# Patient Record
Sex: Female | Born: 1945 | Race: White | Hispanic: No | Marital: Married | Smoking: Former smoker
Health system: Southern US, Community
[De-identification: ages and names within clinical notes are randomized; demographics above are authoritative.]

## PROBLEM LIST (undated history)

## (undated) DIAGNOSIS — E079 Disorder of thyroid, unspecified: Secondary | ICD-10-CM

## (undated) DIAGNOSIS — C50919 Malignant neoplasm of unspecified site of unspecified female breast: Secondary | ICD-10-CM

## (undated) DIAGNOSIS — G43909 Migraine, unspecified, not intractable, without status migrainosus: Secondary | ICD-10-CM

## (undated) DIAGNOSIS — D229 Melanocytic nevi, unspecified: Secondary | ICD-10-CM

## (undated) HISTORY — DX: Disorder of thyroid, unspecified: E07.9

## (undated) HISTORY — PX: ABDOMINAL HYSTERECTOMY: SHX81

## (undated) HISTORY — DX: Malignant neoplasm of unspecified site of unspecified female breast: C50.919

## (undated) HISTORY — DX: Melanocytic nevi, unspecified: D22.9

## (undated) HISTORY — DX: Migraine, unspecified, not intractable, without status migrainosus: G43.909

---

## 2006-04-26 ENCOUNTER — Emergency Department (HOSPITAL_COMMUNITY): Admission: EM | Admit: 2006-04-26 | Discharge: 2006-04-26 | Payer: Self-pay | Admitting: Emergency Medicine

## 2007-05-27 ENCOUNTER — Ambulatory Visit (HOSPITAL_COMMUNITY): Admission: RE | Admit: 2007-05-27 | Discharge: 2007-05-27 | Payer: Self-pay

## 2007-06-24 ENCOUNTER — Encounter: Admission: RE | Admit: 2007-06-24 | Discharge: 2007-06-24 | Payer: Self-pay | Admitting: Internal Medicine

## 2008-06-14 ENCOUNTER — Ambulatory Visit (HOSPITAL_COMMUNITY): Admission: RE | Admit: 2008-06-14 | Discharge: 2008-06-14 | Payer: Self-pay | Admitting: Internal Medicine

## 2009-06-15 ENCOUNTER — Encounter: Admission: RE | Admit: 2009-06-15 | Discharge: 2009-06-15 | Payer: Self-pay | Admitting: Family Medicine

## 2010-06-27 ENCOUNTER — Ambulatory Visit (HOSPITAL_COMMUNITY)
Admission: RE | Admit: 2010-06-27 | Discharge: 2010-06-27 | Payer: Self-pay | Source: Home / Self Care | Attending: Family Medicine | Admitting: Family Medicine

## 2011-02-05 DIAGNOSIS — D229 Melanocytic nevi, unspecified: Secondary | ICD-10-CM

## 2011-02-05 HISTORY — DX: Melanocytic nevi, unspecified: D22.9

## 2011-07-04 ENCOUNTER — Other Ambulatory Visit: Payer: Self-pay | Admitting: Family Medicine

## 2011-07-04 DIAGNOSIS — Z139 Encounter for screening, unspecified: Secondary | ICD-10-CM

## 2011-07-10 ENCOUNTER — Ambulatory Visit (HOSPITAL_COMMUNITY)
Admission: RE | Admit: 2011-07-10 | Discharge: 2011-07-10 | Disposition: A | Discharge status: Home / Self Care | Payer: Medicare Other | Source: Ambulatory Visit | Attending: Family Medicine | Admitting: Family Medicine

## 2011-07-10 DIAGNOSIS — Z139 Encounter for screening, unspecified: Secondary | ICD-10-CM

## 2011-07-10 DIAGNOSIS — Z1231 Encounter for screening mammogram for malignant neoplasm of breast: Secondary | ICD-10-CM | POA: Diagnosis not present

## 2011-09-07 ENCOUNTER — Ambulatory Visit (INDEPENDENT_AMBULATORY_CARE_PROVIDER_SITE_OTHER): Payer: Medicare Other | Admitting: Family Medicine

## 2011-09-07 ENCOUNTER — Encounter: Payer: Self-pay | Admitting: Family Medicine

## 2011-09-07 VITALS — BP 116/65 | HR 49 | Temp 97.8°F | Resp 16 | Ht 63.0 in | Wt 115.6 lb

## 2011-09-07 DIAGNOSIS — G4700 Insomnia, unspecified: Secondary | ICD-10-CM

## 2011-09-07 DIAGNOSIS — Z0000 Encounter for general adult medical examination without abnormal findings: Secondary | ICD-10-CM | POA: Diagnosis not present

## 2011-09-07 DIAGNOSIS — E789 Disorder of lipoprotein metabolism, unspecified: Secondary | ICD-10-CM

## 2011-09-07 DIAGNOSIS — E785 Hyperlipidemia, unspecified: Secondary | ICD-10-CM

## 2011-09-07 DIAGNOSIS — R5383 Other fatigue: Secondary | ICD-10-CM

## 2011-09-07 DIAGNOSIS — Z86018 Personal history of other benign neoplasm: Secondary | ICD-10-CM | POA: Insufficient documentation

## 2011-09-07 LAB — COMPREHENSIVE METABOLIC PANEL
ALT: 21 U/L (ref 0–35)
AST: 20 U/L (ref 0–37)
Albumin: 4.1 g/dL (ref 3.5–5.2)
Alkaline Phosphatase: 55 U/L (ref 39–117)
BUN: 11 mg/dL (ref 6–23)
CO2: 25 mEq/L (ref 19–32)
Calcium: 9.4 mg/dL (ref 8.4–10.5)
Chloride: 107 mEq/L (ref 96–112)
Creat: 0.86 mg/dL (ref 0.50–1.10)
Glucose, Bld: 92 mg/dL (ref 70–99)
Potassium: 5 mEq/L (ref 3.5–5.3)
Sodium: 140 mEq/L (ref 135–145)
Total Bilirubin: 0.4 mg/dL (ref 0.3–1.2)
Total Protein: 7.1 g/dL (ref 6.0–8.3)

## 2011-09-07 LAB — POCT CBC
Granulocyte percent: 59.6 %G (ref 37–80)
HCT, POC: 39.8 % (ref 37.7–47.9)
Hemoglobin: 12.8 g/dL (ref 12.2–16.2)
Lymph, poc: 2.2 (ref 0.6–3.4)
MCH, POC: 30.6 pg (ref 27–31.2)
MCHC: 32.2 g/dL (ref 31.8–35.4)
MCV: 95.1 fL (ref 80–97)
MID (cbc): 0.4 (ref 0–0.9)
MPV: 11.3 fL (ref 0–99.8)
POC Granulocyte: 3.8 (ref 2–6.9)
POC LYMPH PERCENT: 34.4 %L (ref 10–50)
POC MID %: 6 %M (ref 0–12)
Platelet Count, POC: 210 10*3/uL (ref 142–424)
RBC: 4.18 M/uL (ref 4.04–5.48)
RDW, POC: 13.4 %
WBC: 6.4 10*3/uL (ref 4.6–10.2)

## 2011-09-07 LAB — POCT UA - MICROSCOPIC ONLY
Casts, Ur, LPF, POC: NEGATIVE
Crystals, Ur, HPF, POC: NEGATIVE
Yeast, UA: NEGATIVE

## 2011-09-07 LAB — LIPID PANEL
Cholesterol: 187 mg/dL (ref 0–200)
HDL: 58 mg/dL (ref >39)
LDL Cholesterol: 111 mg/dL — ABNORMAL HIGH (ref 0–99)
Total CHOL/HDL Ratio: 3.2 Ratio
Triglycerides: 88 mg/dL (ref <150)
VLDL: 18 mg/dL (ref 0–40)

## 2011-09-07 LAB — POCT URINALYSIS DIPSTICK
Bilirubin, UA: NEGATIVE
Glucose, UA: NEGATIVE
Ketones, UA: NEGATIVE
Leukocytes, UA: NEGATIVE
Nitrite, UA: NEGATIVE
Protein, UA: NEGATIVE
Spec Grav, UA: 1.025
Urobilinogen, UA: 0.2
pH, UA: 5

## 2011-09-07 LAB — TSH: TSH: 6.159 u[IU]/mL — ABNORMAL HIGH (ref 0.350–4.500)

## 2011-09-07 MED ORDER — ZOLPIDEM TARTRATE 10 MG PO TABS: ORAL_TABLET | ORAL | Status: DC

## 2011-09-07 NOTE — Patient Instructions (Signed)

## 2011-09-07 NOTE — Progress Notes (Signed)
Patient here for refill of Ambien which she breaks in quarters for prn use.  Daughter is alcoholic.  Her son in law has a glioblastoma.  She has two grandchildren, one of whom the patient adopted. Has eyes checked regularly Had mammogram 1/13 Had colonoscopy Last dT 2006  No further palpitations  O: HEENT unremarkable   Chest:  Clear Heart:  Reg, no murmur Abdomen:  Soft, nontender without HSM Skin: clear Ext: no edema  A:  Healthy without new problems  P:  Check labs Reviewed pneumovax.

## 2011-09-08 ENCOUNTER — Other Ambulatory Visit: Payer: Self-pay | Admitting: Family Medicine

## 2011-09-08 DIAGNOSIS — E039 Hypothyroidism, unspecified: Secondary | ICD-10-CM

## 2011-09-08 MED ORDER — LEVOTHYROXINE SODIUM 25 MCG PO TABS: 25.0000 ug | ORAL_TABLET | Freq: Every day | ORAL | Status: DC

## 2011-12-11 ENCOUNTER — Other Ambulatory Visit: Payer: Self-pay | Admitting: Family Medicine

## 2011-12-13 ENCOUNTER — Other Ambulatory Visit: Payer: Self-pay

## 2011-12-13 ENCOUNTER — Telehealth: Payer: Self-pay

## 2011-12-13 DIAGNOSIS — E039 Hypothyroidism, unspecified: Secondary | ICD-10-CM

## 2011-12-13 NOTE — Telephone Encounter (Signed)
PATIENT NEEDS REFILL OF LEVOTHYROXINE.  PHARMACY TOLD HER THEY SENT REQUEST ALREADY.  I TOLD HER TO CALL THEM AGAIN AND MAKE SURE IT WAS ESCRIBED.  SHE WILL BE HERE TO SEE DR. L ON SUNDAY

## 2011-12-14 MED ORDER — LEVOTHYROXINE SODIUM 25 MCG PO TABS: 25.0000 ug | ORAL_TABLET | Freq: Every day | ORAL | Status: DC

## 2011-12-14 NOTE — Telephone Encounter (Signed)
Done

## 2011-12-15 NOTE — Telephone Encounter (Signed)
[facility] notifying patient rx sent in.

## 2011-12-16 ENCOUNTER — Ambulatory Visit (INDEPENDENT_AMBULATORY_CARE_PROVIDER_SITE_OTHER): Payer: Medicare Other | Admitting: Family Medicine

## 2011-12-16 VITALS — BP 124/52 | HR 56 | Temp 97.5°F | Resp 16 | Ht 62.78 in | Wt 150.8 lb

## 2011-12-16 DIAGNOSIS — E039 Hypothyroidism, unspecified: Secondary | ICD-10-CM | POA: Diagnosis not present

## 2011-12-16 LAB — TSH: TSH: 4.371 u[IU]/mL (ref 0.350–4.500)

## 2011-12-16 NOTE — Progress Notes (Signed)
66 yo woman here for thyroid check.  No problems on levothyroxine.  Last dose was 3 days ago.  O:  NAD Results for orders placed in visit on 09/07/11  LIPID PANEL      Component Value Range   Cholesterol 187  0 - 200 mg/dL   Triglycerides 88  <161 mg/dL   HDL 58  >09 mg/dL   Total CHOL/HDL Ratio 3.2     VLDL 18  0 - 40 mg/dL   LDL Cholesterol 604 (*) 0 - 99 mg/dL  COMPREHENSIVE METABOLIC PANEL      Component Value Range   Sodium 140  135 - 145 mEq/L   Potassium 5.0  3.5 - 5.3 mEq/L   Chloride 107  96 - 112 mEq/L   CO2 25  19 - 32 mEq/L   Glucose, Bld 92  70 - 99 mg/dL   BUN 11  6 - 23 mg/dL   Creat 5.40  9.81 - 1.91 mg/dL   Total Bilirubin 0.4  0.3 - 1.2 mg/dL   Alkaline Phosphatase 55  39 - 117 U/L   AST 20  0 - 37 U/L   ALT 21  0 - 35 U/L   Total Protein 7.1  6.0 - 8.3 g/dL   Albumin 4.1  3.5 - 5.2 g/dL   Calcium 9.4  8.4 - 47.8 mg/dL  POCT CBC      Component Value Range   WBC 6.4  4.6 - 10.2 K/uL   Lymph, poc 2.2  0.6 - 3.4   POC LYMPH PERCENT 34.4  10 - 50 %L   MID (cbc) 0.4  0 - 0.9   POC MID % 6.0  0 - 12 %M   POC Granulocyte 3.8  2 - 6.9   Granulocyte percent 59.6  37 - 80 %G   RBC 4.18  4.04 - 5.48 M/uL   Hemoglobin 12.8  12.2 - 16.2 g/dL   HCT, POC 29.5  62.1 - 47.9 %   MCV 95.1  80 - 97 fL   MCH, POC 30.6  27 - 31.2 pg   MCHC 32.2  31.8 - 35.4 g/dL   RDW, POC 30.8     Platelet Count, POC 210  142 - 424 K/uL   MPV 11.3  0 - 99.8 fL  POCT UA - MICROSCOPIC ONLY      Component Value Range   WBC, Ur, HPF, POC 0-2     RBC, urine, microscopic 2-4     Bacteria, U Microscopic 2+     Mucus, UA 1+     Epithelial cells, urine per micros 5-10     Crystals, Ur, HPF, POC neg     Casts, Ur, LPF, POC neg     Yeast, UA neg    TSH      Component Value Range   TSH 6.159 (*) 0.350 - 4.500 uIU/mL  POCT URINALYSIS DIPSTICK      Component Value Range   Color, UA yellow     Clarity, UA hazy     Glucose, UA neg     Bilirubin, UA neg     Ketones, UA neg     Spec Grav,  UA 1.025     Blood, UA mod     pH, UA 5.0     Protein, UA neg     Urobilinogen, UA 0.2     Nitrite, UA neg     Leukocytes, UA Negative       A:  Hypothyroidism.  Seems stable on the 25 mcg.  P:  TSH ordered.

## 2011-12-17 ENCOUNTER — Other Ambulatory Visit: Payer: Self-pay | Admitting: Family Medicine

## 2011-12-17 DIAGNOSIS — E071 Dyshormogenetic goiter: Secondary | ICD-10-CM

## 2011-12-17 MED ORDER — LEVOTHYROXINE SODIUM 50 MCG PO TABS: 50.0000 ug | ORAL_TABLET | Freq: Every day | ORAL | Status: DC

## 2012-03-18 ENCOUNTER — Other Ambulatory Visit: Payer: Self-pay | Admitting: Family Medicine

## 2012-04-01 DIAGNOSIS — D219 Benign neoplasm of connective and other soft tissue, unspecified: Secondary | ICD-10-CM | POA: Diagnosis not present

## 2012-04-01 DIAGNOSIS — L570 Actinic keratosis: Secondary | ICD-10-CM | POA: Diagnosis not present

## 2012-04-01 DIAGNOSIS — D239 Other benign neoplasm of skin, unspecified: Secondary | ICD-10-CM | POA: Diagnosis not present

## 2012-04-15 ENCOUNTER — Other Ambulatory Visit: Payer: Self-pay | Admitting: Family Medicine

## 2012-06-30 ENCOUNTER — Other Ambulatory Visit: Payer: Self-pay | Admitting: Family Medicine

## 2012-06-30 DIAGNOSIS — Z139 Encounter for screening, unspecified: Secondary | ICD-10-CM

## 2012-07-14 ENCOUNTER — Ambulatory Visit (HOSPITAL_COMMUNITY)
Admission: RE | Admit: 2012-07-14 | Discharge: 2012-07-14 | Disposition: A | Discharge status: Home / Self Care | Payer: Medicare Other | Source: Ambulatory Visit | Attending: Family Medicine | Admitting: Family Medicine

## 2012-07-14 DIAGNOSIS — Z139 Encounter for screening, unspecified: Secondary | ICD-10-CM

## 2012-07-14 DIAGNOSIS — Z1231 Encounter for screening mammogram for malignant neoplasm of breast: Secondary | ICD-10-CM | POA: Diagnosis not present

## 2012-09-03 ENCOUNTER — Ambulatory Visit (INDEPENDENT_AMBULATORY_CARE_PROVIDER_SITE_OTHER): Payer: Medicare Other | Admitting: Family Medicine

## 2012-09-03 VITALS — BP 110/60 | HR 51 | Temp 98.8°F | Resp 16 | Ht 62.5 in | Wt 154.0 lb

## 2012-09-03 DIAGNOSIS — E039 Hypothyroidism, unspecified: Secondary | ICD-10-CM | POA: Diagnosis not present

## 2012-09-03 DIAGNOSIS — R42 Dizziness and giddiness: Secondary | ICD-10-CM | POA: Diagnosis not present

## 2012-09-03 DIAGNOSIS — R319 Hematuria, unspecified: Secondary | ICD-10-CM | POA: Diagnosis not present

## 2012-09-03 DIAGNOSIS — E785 Hyperlipidemia, unspecified: Secondary | ICD-10-CM

## 2012-09-03 LAB — POCT URINALYSIS DIPSTICK
Bilirubin, UA: NEGATIVE
Glucose, UA: NEGATIVE
Ketones, UA: 1.5
Leukocytes, UA: NEGATIVE
Nitrite, UA: NEGATIVE
Protein, UA: NEGATIVE
Spec Grav, UA: 1.025
Urobilinogen, UA: 1
pH, UA: 6

## 2012-09-03 LAB — COMPREHENSIVE METABOLIC PANEL
ALT: 21 U/L (ref 0–35)
Albumin: 4.4 g/dL (ref 3.5–5.2)
Alkaline Phosphatase: 66 U/L (ref 39–117)
CO2: 29 mEq/L (ref 19–32)
Calcium: 9.7 mg/dL (ref 8.4–10.5)
Creat: 0.74 mg/dL (ref 0.50–1.10)
Glucose, Bld: 86 mg/dL (ref 70–99)
Total Bilirubin: 0.6 mg/dL (ref 0.3–1.2)

## 2012-09-03 LAB — POCT CBC
Granulocyte percent: 55.8 % (ref 37–80)
HCT, POC: 46 % (ref 37.7–47.9)
Hemoglobin: 14.4 g/dL (ref 12.2–16.2)
Lymph, poc: 2.7 (ref 0.6–3.4)
MCH, POC: 29.9 pg (ref 27–31.2)
MCHC: 31.3 g/dL — AB (ref 31.8–35.4)
MCV: 95.5 fL (ref 80–97)
MID (cbc): 0.6 (ref 0–0.9)
MPV: 11.5 fL (ref 0–99.8)
POC Granulocyte: 4.2 (ref 2–6.9)
POC LYMPH PERCENT: 36.3 % (ref 10–50)
POC MID %: 7.9 %M (ref 0–12)
Platelet Count, POC: 239 10*3/uL (ref 142–424)
RBC: 4.82 M/uL (ref 4.04–5.48)
RDW, POC: 13.6 %
WBC: 7.5 10*3/uL (ref 4.6–10.2)

## 2012-09-03 LAB — POCT UA - MICROSCOPIC ONLY
Casts, Ur, LPF, POC: NEGATIVE
Crystals, Ur, HPF, POC: NEGATIVE
Mucus, UA: POSITIVE
Yeast, UA: NEGATIVE

## 2012-09-03 LAB — LIPID PANEL
Cholesterol: 196 mg/dL (ref 0–200)
HDL: 68 mg/dL (ref >39)
LDL Cholesterol: 113 mg/dL — ABNORMAL HIGH (ref 0–99)
Total CHOL/HDL Ratio: 2.9 Ratio
Triglycerides: 76 mg/dL (ref <150)
VLDL: 15 mg/dL (ref 0–40)

## 2012-09-03 LAB — COMPREHENSIVE METABOLIC PANEL WITH GFR
AST: 20 U/L (ref 0–37)
BUN: 12 mg/dL (ref 6–23)
Chloride: 105 meq/L (ref 96–112)
Potassium: 4.7 meq/L (ref 3.5–5.3)
Sodium: 140 meq/L (ref 135–145)
Total Protein: 7.3 g/dL (ref 6.0–8.3)

## 2012-09-03 LAB — TSH: TSH: 2.628 u[IU]/mL (ref 0.350–4.500)

## 2012-09-03 NOTE — Progress Notes (Signed)
[facility]:  Office Visit  Chief Complaint:  Chief Complaint  Patient presents with  . Dizziness    x 1 day    HPI: Julie Pacheco is a 67 y.o. female who complains of dizziness starting  last night, laid down in bed, room was spinning. Earlier in the day when she was looking for some pictures, she looked up her head, neck extension the  room was spinning. Dizziness is better this morning, currently she does not have any sxs. She has been able to move head without dizziness. She has been active, has gotten her hair done without problems. She denies CP or palpitations. She has had a chronic history of bradycardia. Has had stress test and also echo done with cardiology 2 years ago  without any problems.  She is on levothyroxine and has not had her TSH checked in about 9 months after increases. Denies CP or SOB.  Denies vertigo, has had recent sinus congestion for last 2 weeks. Denies nausea, vomiting, she does have night sweats from hotflashes. 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  BP was 146/67 at [facility]. She normally has low BP.  Wants to get her lipids rechecked, states she has not eaten anything since this AM   Past Medical History  Diagnosis Date  . Migraines    History reviewed. No pertinent past surgical history. History   Social History  . Marital Status: Married    Spouse Name: N/A    Number of Children: N/A  . Years of Education: N/A   Social History Main Topics  . Smoking status: Former Smoker    Quit date: 12/15/1981  . Smokeless tobacco: None  . Alcohol Use: None  . Drug Use: None  . Sexually Active: None   Other Topics Concern  . None   Social History Narrative  . None   History reviewed. No pertinent family history. No Known  Allergies Prior to Admission medications   Medication Sig Start Date End Date Taking? Authorizing Provider  levothyroxine (SYNTHROID, LEVOTHROID) 50 MCG tablet Take 1 tablet (50 mcg total) by mouth daily. 12/17/11 12/16/12 Yes Elvina Sidle, MD  zolpidem (AMBIEN) 10 MG tablet Take 10 mg by mouth. Takes 1/2 at bedtime prn.   Yes Historical Provider, MD  zolpidem (AMBIEN) 10 MG tablet Break into half or quarter for sleep 09/07/11   Elvina Sidle, MD  zolpidem (AMBIEN) 10 MG tablet TAKE ONE-HALF OR ONE-FOURTH TABLET BY MOUTH DAILY AT BEDTIME FOR SLEEP 03/18/12   Chelle S Jeffery, PA-C  zolpidem (AMBIEN) 10 MG tablet TAKE ONE-HALF OR ONE-FOURTH TABLET BY MOUTH DAILY AT BEDTIME FOR SLEEP 04/15/12   Elvina Sidle, MD     ROS: The patient denies fevers, chills, night sweats, unintentional weight loss, chest pain, palpitations, wheezing, dyspnea on exertion, nausea, vomiting, abdominal pain, dysuria, hematuria, melena, numbness, weakness, or tingling.   All other systems have been reviewed and were otherwise negative with the exception of those mentioned in the HPI and as above.    PHYSICAL EXAM: Filed Vitals:   09/03/12 1659  BP: 110/60  Pulse:   Temp:   Resp:    Filed Vitals:   09/03/12 1529  Height: 5' 2.5" (1.588 m)  Weight: 154 lb (69.854 kg)  Body mass index is 27.7 kg/(m^2).  General: Alert, no acute distress HEENT:  Normocephalic, atraumatic, oropharynx patent. EOMI, PERRLA, fundoscopic exam nl Cardiovascular:  Bradycardia,  Regular rhythm, no rubs murmurs or gallops.  No Carotid bruits, radial pulse intact. No pedal edema.  Respiratory: Clear to auscultation bilaterally.  No wheezes, rales, or rhonchi.  No cyanosis, no use of accessory musculature GI: No organomegaly, abdomen is soft and non-tender, positive bowel sounds.  No masses. Skin: No rashes. Neurologic: Facial musculature symmetric. Psychiatric: Patient is appropriate throughout our interaction. Lymphatic: No  cervical lymphadenopathy Musculoskeletal: Gait intact.   LABS: Results for orders placed in visit on 09/03/12  POCT CBC      Result Value Range   WBC 7.5  4.6 - 10.2 K/uL   Lymph, poc 2.7  0.6 - 3.4   POC LYMPH PERCENT 36.3  10 - 50 %L   MID (cbc) 0.6  0 - 0.9   POC MID % 7.9  0 - 12 %M   POC Granulocyte 4.2  2 - 6.9   Granulocyte percent 55.8  37 - 80 %G   RBC 4.82  4.04 - 5.48 M/uL   Hemoglobin 14.4  12.2 - 16.2 g/dL   HCT, POC 16.1  09.6 - 47.9 %   MCV 95.5  80 - 97 fL   MCH, POC 29.9  27 - 31.2 pg   MCHC 31.3 (*) 31.8 - 35.4 g/dL   RDW, POC 04.5     Platelet Count, POC 239  142 - 424 K/uL   MPV 11.5  0 - 99.8 fL  POCT URINALYSIS DIPSTICK      Result Value Range   Color, UA yellow     Clarity, UA clear     Glucose, UA neg     Bilirubin, UA neg     Ketones, UA 1.5     Spec Grav, UA 1.025     Blood, UA small     pH, UA 6.0     Protein, UA neg     Urobilinogen, UA 1.0     Nitrite, UA neg     Leukocytes, UA Negative    POCT UA - MICROSCOPIC ONLY      Result Value Range   WBC, Ur, HPF, POC 6-7     RBC, urine, microscopic 19-21     Bacteria, U Microscopic 4+     Mucus, UA pos     Epithelial cells, urine per micros 5-7     Crystals, Ur, HPF, POC neg     Casts, Ur, LPF, POC neg     Yeast, UA neg       EKG/XRAY:   Primary read interpreted by Dr. Conley Rolls at [facility]. EKG sinus brady 45-49 bpm, nonspecific ST changes, nl intervals   ASSESSMENT/PLAN: Encounter Diagnoses  Name Primary?  . Dizziness and giddiness Yes  . Other and unspecified hyperlipidemia   . Unspecified hypothyroidism   . Hematuria, unspecified    [phone] Declines nasal spray Dizziness etiology--- related to sinus congestion/inner ear  vs possible medication SE vs less likely orthostatics or cardiac(orthostatics nl) vs unlikely UTI. She has chronic bradycardia, no other changes on EKG, no chest pain Dizziness is resolved today, continue to monitor Will await for pending labs TSH, CMP Advise  patient to return after her trip from [place] for wedding to recheck urine for hematuria. Former smoker x 30 years, 1/2 ppd quit 1989      [place], DO 09/03/2012 6:10 PM

## 2012-09-04 ENCOUNTER — Encounter: Payer: Self-pay | Admitting: Family Medicine

## 2012-09-04 ENCOUNTER — Telehealth: Payer: Self-pay | Admitting: Family Medicine

## 2012-09-04 NOTE — Telephone Encounter (Signed)
Spoke to patient about labs. Advise her to return to [place] for repeat urine after [place] trip.

## 2012-10-08 ENCOUNTER — Ambulatory Visit (INDEPENDENT_AMBULATORY_CARE_PROVIDER_SITE_OTHER): Payer: Medicare Other | Admitting: Family Medicine

## 2012-10-08 VITALS — BP 110/72 | HR 62 | Temp 97.8°F | Resp 16 | Ht 63.0 in | Wt 152.0 lb

## 2012-10-08 DIAGNOSIS — R319 Hematuria, unspecified: Secondary | ICD-10-CM

## 2012-10-08 DIAGNOSIS — G4700 Insomnia, unspecified: Secondary | ICD-10-CM | POA: Diagnosis not present

## 2012-10-08 DIAGNOSIS — N390 Urinary tract infection, site not specified: Secondary | ICD-10-CM

## 2012-10-08 LAB — POCT URINALYSIS DIPSTICK
Bilirubin, UA: NEGATIVE
Glucose, UA: NEGATIVE
Ketones, UA: NEGATIVE
Leukocytes, UA: NEGATIVE
Nitrite, UA: NEGATIVE
Protein, UA: NEGATIVE
Spec Grav, UA: >=1.03
Urobilinogen, UA: 0.2
pH, UA: 5.5

## 2012-10-08 LAB — POCT UA - MICROSCOPIC ONLY
Casts, Ur, LPF, POC: NEGATIVE
Crystals, Ur, HPF, POC: NEGATIVE
Mucus, UA: POSITIVE
Yeast, UA: NEGATIVE

## 2012-10-08 MED ORDER — ZOLPIDEM TARTRATE 10 MG PO TABS: ORAL_TABLET | ORAL | Status: DC

## 2012-10-08 NOTE — Progress Notes (Signed)
Patient ID: Julie Pacheco MRN: [id number], DOB: Oct 20, 1945, 67 y.o. Date of Encounter: 10/08/2012, 9:01 AM  Primary Physician: Elvina Sidle, MD  Chief Complaint:  Chief Complaint  Patient presents with  . Urinary Tract Infection    recheck    HPI: 67 y.o. year old female presents with 30 day history of hematuria  No sick contacts, recent antibiotics, or recent travels.   No vaginal discharge, back pain, fever  Past Medical History  Diagnosis Date  . Migraines      Home Meds: Prior to Admission medications   Medication Sig Start Date End Date Taking? Authorizing Provider  levothyroxine (SYNTHROID, LEVOTHROID) 50 MCG tablet Take 1 tablet (50 mcg total) by mouth daily. 12/17/11 12/16/12 Yes Elvina Sidle, MD  zolpidem (AMBIEN) 10 MG tablet Break into half or quarter for sleep 09/07/11  Yes Elvina Sidle, MD  zolpidem (AMBIEN) 10 MG tablet Take 10 mg by mouth. Takes 1/2 at bedtime prn.    Historical Provider, MD  zolpidem (AMBIEN) 10 MG tablet TAKE ONE-HALF OR ONE-FOURTH TABLET BY MOUTH DAILY AT BEDTIME FOR SLEEP 03/18/12   Chelle S Jeffery, PA-C  zolpidem (AMBIEN) 10 MG tablet TAKE ONE-HALF OR ONE-FOURTH TABLET BY MOUTH DAILY AT BEDTIME FOR SLEEP 04/15/12   Elvina Sidle, MD    Allergies: No Known Allergies  History   Social History  . Marital Status: Married    Spouse Name: N/A    Number of Children: N/A  . Years of Education: N/A   Occupational History  . Not on file.   Social History Main Topics  . Smoking status: Former Smoker    Quit date: 12/15/1981  . Smokeless tobacco: Not on file  . Alcohol Use: Not on file  . Drug Use: Not on file  . Sexually Active: Not on file   Other Topics Concern  . Not on file   Social History Narrative  . No narrative on file     Review of Systems: Constitutional: negative for chills, fever, night sweats or weight changes Cardiovascular: negative for chest pain or palpitations Respiratory: negative for  hemoptysis, wheezing, or shortness of breath Abdominal: negative for abdominal pain, nausea, vomiting or diarrhea Dermatological: negative for rash Neurologic: negative for headache   Physical Exam: Blood pressure 110/72, pulse 62, temperature 97.8 F (36.6 C), temperature source Oral, resp. rate 16, height 5\' 3"  (1.6 m), weight 152 lb (68.947 kg), SpO2 94.00%., Body mass index is 26.93 kg/(m^2). General: Well developed, well nourished, in no acute distress. Head: Normocephalic, atraumatic, eyes without discharge, sclera non-icteric, nares are congested. Bilateral auditory canals clear, TM's are without perforation, pearly grey with reflective cone of light bilaterally. Serous effusion bilaterally behind TM's. Maxillary sinus TTP. Oral cavity moist, dentition normal. Posterior pharynx with post nasal drip and mild erythema. No peritonsillar abscess or tonsillar exudate. Neck: Supple. No thyromegaly. Full ROM. No lymphadenopathy. Lungs: Coarse breath sounds bilaterally without Clear bilaterally to auscultation without wheezes, rales, or rhonchi. Breathing is unlabored.  Heart: RRR with S1 S2. No murmurs, rubs, or gallops appreciated. Abdomen: Soft, non-tender, non-distended with normoactive bowel sounds. No hepatosplenomegaly. No rebound/guarding. No obvious abdominal masses. McBurney's, Rovsing's, Iliopsoas, and table jar all negative. Msk:  Strength and tone normal for age. Extremities: No clubbing or cyanosis. No edema. Neuro: Alert and oriented X 3. Moves all extremities spontaneously. CNII-XII grossly in tact. Psych:  Responds to questions appropriately with a normal affect.   Labs: Results for orders placed in visit on 10/08/12  POCT  UA - MICROSCOPIC ONLY      Result Value Range   WBC, Ur, HPF, POC 2-3     RBC, urine, microscopic 4-5     Bacteria, U Microscopic 2+     Mucus, UA positive     Epithelial cells, urine per micros 6-8     Crystals, Ur, HPF, POC neg     Casts, Ur, LPF,  POC neg     Yeast, UA neg    POCT URINALYSIS DIPSTICK      Result Value Range   Color, UA yellow     Clarity, UA clear     Glucose, UA neg     Bilirubin, UA neg     Ketones, UA neg     Spec Grav, UA >=1.030     Blood, UA small     pH, UA 5.5     Protein, UA neg     Urobilinogen, UA 0.2     Nitrite, UA neg     Leukocytes, UA Negative        ASSESSMENT AND PLAN:  67 y.o. year old female with recent hematuria.  The amount of blood in urine is minimal.  Recheck urine in 60 days. - -Mucinex -Tylenol/Motrin prn -Rest/fluids -RTC precautions -RTC 3-5 days if no improvement  Signed, Elvina Sidle, MD 10/08/2012 9:01 AM

## 2012-10-08 NOTE — Patient Instructions (Signed)
Results for orders placed in visit on 10/08/12  POCT UA - MICROSCOPIC ONLY      Result Value Range   WBC, Ur, HPF, POC 2-3     RBC, urine, microscopic 4-5     Bacteria, U Microscopic 2+     Mucus, UA positive     Epithelial cells, urine per micros 6-8     Crystals, Ur, HPF, POC neg     Casts, Ur, LPF, POC neg     Yeast, UA neg    POCT URINALYSIS DIPSTICK      Result Value Range   Color, UA yellow     Clarity, UA clear     Glucose, UA neg     Bilirubin, UA neg     Ketones, UA neg     Spec Grav, UA >=1.030     Blood, UA small     pH, UA 5.5     Protein, UA neg     Urobilinogen, UA 0.2     Nitrite, UA neg     Leukocytes, UA Negative

## 2012-10-21 ENCOUNTER — Ambulatory Visit (INDEPENDENT_AMBULATORY_CARE_PROVIDER_SITE_OTHER): Payer: Medicare Other | Admitting: Family Medicine

## 2012-10-21 VITALS — BP 110/78 | HR 54 | Temp 98.0°F | Resp 16 | Ht 63.0 in | Wt 151.0 lb

## 2012-10-21 DIAGNOSIS — L255 Unspecified contact dermatitis due to plants, except food: Secondary | ICD-10-CM | POA: Diagnosis not present

## 2012-10-21 DIAGNOSIS — L237 Allergic contact dermatitis due to plants, except food: Secondary | ICD-10-CM

## 2012-10-21 MED ORDER — HYDROXYZINE HCL 25 MG PO TABS: 12.5000 mg | ORAL_TABLET | Freq: Three times a day (TID) | ORAL | Status: DC | PRN

## 2012-10-21 MED ORDER — PREDNISONE 10 MG PO TABS: ORAL_TABLET | ORAL | Status: DC

## 2012-10-21 NOTE — Progress Notes (Signed)
  Subjective:    Patient ID: Julie Pacheco, female    DOB: 1945-12-01, 67 y.o.   MRN: [id number] Chief Complaint  Patient presents with  . Rash    all over body x 5 day   HPI  Has had poison oak or ivy - was exposed 5d ago - thought it was a mosquito bite on her chin but kept spreading and puffy - spread up entire face, both sides, splotchy - now on arms and waist.  Tried bleach, tried some topical poison ivy spray, iverest - pink tube w/ calamine in it.  Very itchy - not taking any otc medications for this.  Has never had anything like this prior.  Past Medical History  Diagnosis Date  . Migraines   . Thyroid disease    Current Outpatient Prescriptions on File Prior to Visit  Medication Sig Dispense Refill  . levothyroxine (SYNTHROID, LEVOTHROID) 50 MCG tablet Take 1 tablet (50 mcg total) by mouth daily.  90 tablet  3  . zolpidem (AMBIEN) 10 MG tablet As directed at bedtime  30 tablet  1   No current facility-administered medications on file prior to visit.   No Known Allergies  Review of Systems  Constitutional: Negative for fever, chills and diaphoresis.  Musculoskeletal: Negative for joint swelling and arthralgias.  Skin: Positive for color change and rash. Negative for pallor and wound.  Hematological: Negative for adenopathy. Bruises/bleeds easily.  Psychiatric/Behavioral: Positive for sleep disturbance.      BP 110/78  Pulse 54  Temp(Src) 98 F (36.7 C) (Oral)  Resp 16  Ht 5\' 3"  (1.6 m)  Wt 151 lb (68.493 kg)  BMI 26.76 kg/m2  SpO2 98% Objective:   Physical Exam  Constitutional: She is oriented to person, place, and time. She appears well-developed and well-nourished. No distress.  HENT:  Head: Normocephalic and atraumatic.  Right Ear: External ear normal.  Eyes: Conjunctivae are normal. No scleral icterus.  Pulmonary/Chest: Effort normal.  Neurological: She is alert and oriented to person, place, and time.  Skin: Skin is warm and dry. Rash noted. Rash is  maculopapular and vesicular. She is not diaphoretic. No erythema.  Over bilateral chin, cheeks, eyebrows, forearms, left waist  Psychiatric: She has a normal mood and affect. Her behavior is normal.      Assessment & Plan:  Poison ivy dermatitis - reviewed trx options and pt really doesn't want to try topical - spread to extensive - so will do 12d steroid taper.  Topical calamine and ice/cool compresses prn.  Meds ordered this encounter  Medications  . predniSONE (DELTASONE) 10 MG tablet    Sig: Take 6 tabs qd x2d, then 5tabs qd x 2d, then 4 tabs qd x 2d, then 3 tabs qd x 2d, then 2 tabs qd x 2d, then 1 tab qd x 1d    Dispense:  42 tablet    Refill:  0  . hydrOXYzine (ATARAX/VISTARIL) 25 MG tablet    Sig: Take 0.5-1 tablets (12.5-25 mg total) by mouth every 8 (eight) hours as needed for itching.    Dispense:  30 tablet    Refill:  0

## 2012-10-21 NOTE — Patient Instructions (Addendum)
Poison Ivy Poison ivy is a inflammation of the skin (contact dermatitis) caused by touching the allergens on the leaves of the ivy plant following previous exposure to the plant. The rash usually appears 48 hours after exposure. The rash is usually bumps (papules) or blisters (vesicles) in a linear pattern. Depending on your own sensitivity, the rash may simply cause redness and itching, or it may also progress to blisters which may break open. These must be well cared for to prevent secondary bacterial (germ) infection, followed by scarring. Keep any open areas dry, clean, dressed, and covered with an antibacterial ointment if needed. The eyes may also get puffy. The puffiness is worst in the morning and gets better as the day progresses. This dermatitis usually heals without scarring, within 2 to 3 weeks without treatment. HOME CARE INSTRUCTIONS  Thoroughly wash with soap and water as soon as you have been exposed to poison ivy. You have about one half hour to remove the plant resin before it will cause the rash. This washing will destroy the oil or antigen on the skin that is causing, or will cause, the rash. Be sure to wash under your fingernails as any plant resin there will continue to spread the rash. Do not rub skin vigorously when washing affected area. Poison ivy cannot spread if no oil from the plant remains on your body. A rash that has progressed to weeping sores will not spread the rash unless you have not washed thoroughly. It is also important to wash any clothes you have been wearing as these may carry active allergens. The rash will return if you wear the unwashed clothing, even several days later. Avoidance of the plant in the future is the best measure. Poison ivy plant can be recognized by the number of leaves. Generally, poison ivy has three leaves with flowering branches on a single stem. Diphenhydramine may be purchased over the counter and used as needed for itching. Do not drive with  this medication if it makes you drowsy.Ask your caregiver about medication for children. SEEK MEDICAL CARE IF:  Open sores develop.  Redness spreads beyond area of rash.  You notice purulent (pus-like) discharge.  You have increased pain.  Other signs of infection develop (such as fever). Document Released: 05/11/2000 Document Revised: 08/06/2011 Document Reviewed: 03/30/2009 Exit[employer] Patient Information 2014 [employer].  

## 2012-12-09 ENCOUNTER — Other Ambulatory Visit: Payer: Self-pay | Admitting: Family Medicine

## 2012-12-10 NOTE — Telephone Encounter (Signed)
Refill encounter ?

## 2012-12-19 ENCOUNTER — Ambulatory Visit (INDEPENDENT_AMBULATORY_CARE_PROVIDER_SITE_OTHER): Payer: Medicare Other | Admitting: Emergency Medicine

## 2012-12-19 VITALS — BP 110/64 | HR 49 | Temp 97.2°F | Resp 18 | Ht 63.0 in | Wt 149.0 lb

## 2012-12-19 DIAGNOSIS — R319 Hematuria, unspecified: Secondary | ICD-10-CM

## 2012-12-19 LAB — POCT URINALYSIS DIPSTICK
Bilirubin, UA: NEGATIVE
Glucose, UA: NEGATIVE
Ketones, UA: NEGATIVE
Leukocytes, UA: NEGATIVE
pH, UA: 5.5

## 2012-12-19 LAB — POCT UA - MICROSCOPIC ONLY
Casts, Ur, LPF, POC: NEGATIVE
Yeast, UA: NEGATIVE

## 2012-12-19 MED ORDER — LEVOTHYROXINE SODIUM 50 MCG PO TABS: 50.0000 ug | ORAL_TABLET | Freq: Every day | ORAL | Status: DC

## 2012-12-19 NOTE — Progress Notes (Signed)
[facility] [address], [place] [address] [phone]  Date:  12/19/2012   Name:  Julie Pacheco   DOB:  14-Sep-1945   MRN:  [id number]  PCP:  Elvina Sidle, MD    Chief Complaint: Follow-up   History of Present Illness:  Julie Pacheco is a 67 y.o. very pleasant female patient who presents with the following:  Patient has a year long history of microscopic hematuria.  Not symptomatic.  No fever or chills.  No nausea or vomiting.  No GI, GYN or GU symptoms. No improvement with over the counter medications or other home remedies. Denies other complaint or health concern today.   Patient Active Problem List   Diagnosis Date Noted  . 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 Insomnia 09/07/2011    Past Medical History  Diagnosis Date  . Migraines   . Thyroid disease     History reviewed. No pertinent past surgical history.  History  Substance Use Topics  . Smoking status: Former Smoker    Quit date: 12/15/1981  . Smokeless tobacco: Not on file  . Alcohol Use: Not on file    History reviewed. No pertinent family history.  No Known Allergies  Medication list has been reviewed and updated.  Current Outpatient Prescriptions on File Prior to Visit  Medication Sig Dispense Refill  . hydrOXYzine (ATARAX/VISTARIL) 25 MG tablet Take 0.5-1 tablets (12.5-25 mg total) by mouth every 8 (eight) hours as needed for itching.  30 tablet  0  . levothyroxine (SYNTHROID, LEVOTHROID) 50 MCG tablet TAKE ONE TABLET BY MOUTH EVERY DAY  30 tablet  0  . zolpidem (AMBIEN) 10 MG tablet As directed at bedtime  30 tablet  1  . predniSONE (DELTASONE) 10 MG tablet Take 6 tabs qd x2d, then 5tabs qd x 2d, then 4 tabs qd x 2d, then 3 tabs qd x 2d, then 2 tabs qd x 2d, then 1 tab qd x 1d  42 tablet  0   No current facility-administered medications on file prior to visit.    Review of Systems:  As per HPI, otherwise negative.    Physical Examination: Filed Vitals:   12/19/12 1013  BP: 110/64  Pulse: 49  Temp: 97.2 F (36.2 C)  Resp: 18   Filed Vitals:   12/19/12 1013  Height: 5\' 3"  (1.6 m)  Weight: 149 lb (67.586 kg)   Body mass index is 26.4 kg/(m^2). Ideal Body Weight: Weight in (lb) to have BMI = 25: 140.8   GEN: WDWN, NAD, Non-toxic, Alert & Oriented x 3 HEENT: Atraumatic, Normocephalic.  Ears and Nose: No external deformity. EXTR: No clubbing/cyanosis/edema NEURO: Normal gait.  PSYCH: Normally interactive. Conversant. Not depressed or anxious appearing.  Calm demeanor.  ABDOMEN:  Benign and soft, not tender  Assessment and Plan: Hematuria Urology consultation  Signed,  Phillips Odor, MD    Results for orders placed in visit on 12/19/12  POCT UA - MICROSCOPIC ONLY      Result Value Range   WBC, Ur, HPF, POC rare     RBC, urine, microscopic rare     Bacteria, U Microscopic trace     Mucus, UA neg     Epithelial cells, urine per micros 3-8     Crystals, Ur, HPF, POC neg     Casts, Ur, LPF, POC neg     Yeast, UA neg    POCT URINALYSIS DIPSTICK      Result Value Range   Color, UA  yellow     Clarity, UA clear     Glucose, UA neg     Bilirubin, UA neg     Ketones, UA neg\     Spec Grav, UA <=1.005     Blood, UA trace-intact     pH, UA 5.5     Protein, UA neg     Urobilinogen, UA 0.2     Nitrite, UA neg     Leukocytes, UA Negative

## 2013-01-27 ENCOUNTER — Ambulatory Visit (INDEPENDENT_AMBULATORY_CARE_PROVIDER_SITE_OTHER): Payer: Medicare Other | Admitting: Urology

## 2013-01-27 DIAGNOSIS — R319 Hematuria, unspecified: Secondary | ICD-10-CM | POA: Diagnosis not present

## 2013-03-20 ENCOUNTER — Telehealth: Payer: Self-pay

## 2013-03-20 DIAGNOSIS — G4700 Insomnia, unspecified: Secondary | ICD-10-CM

## 2013-03-20 NOTE — Telephone Encounter (Signed)
Pharm requests RF of ambien 10 mg. I have pended it for review.

## 2013-03-22 NOTE — Telephone Encounter (Signed)
Current recommendations are that women get prescriptions of no more than 5 mg nightly because of potential side effects.  Please call her in one month of 5 mg ambien. Unfortunately, the health system will sanction me if I write for 10 mg nightly. The ambien 5 mg #30 one qhs can have 5 refills.

## 2013-03-23 MED ORDER — ZOLPIDEM TARTRATE 5 MG PO TABS
5.0000 mg | ORAL_TABLET | Freq: Every evening | ORAL | Status: DC | PRN
Start: 2013-03-23 — End: 2014-02-03

## 2013-03-23 NOTE — Telephone Encounter (Signed)
thanks

## 2013-03-23 NOTE — Telephone Encounter (Signed)
Sent/done 

## 2013-03-23 NOTE — Addendum Note (Signed)
Addended byCaffie Damme on: 03/23/2013 09:03 AM   Modules accepted: Orders

## 2013-03-28 ENCOUNTER — Ambulatory Visit: Payer: Medicare Other

## 2013-03-28 ENCOUNTER — Ambulatory Visit (INDEPENDENT_AMBULATORY_CARE_PROVIDER_SITE_OTHER): Payer: Medicare Other | Admitting: Family Medicine

## 2013-03-28 VITALS — BP 126/82 | HR 63 | Temp 97.8°F | Resp 16 | Ht 63.0 in | Wt 151.0 lb

## 2013-03-28 DIAGNOSIS — M940 Chondrocostal junction syndrome [Tietze]: Secondary | ICD-10-CM | POA: Diagnosis not present

## 2013-03-28 DIAGNOSIS — S2341XA Sprain of ribs, initial encounter: Secondary | ICD-10-CM

## 2013-03-28 NOTE — Progress Notes (Signed)
Subjective: Patient was lifting a cabinet into a pickup truck days ago and the fracture got stuck. She grabbed and lifted it and felt a pop in her right lower we had margin. It is continued to hurt her quite a lot. If she rolls over on that side or lays on her abdomen or back it hurts her. No hemoptysis.  Objective: Chest is clear to auscultation. Heart regular without murmurs. Her chest wall is quite tender along the anterior margin at the costochondral junction just between the midclavicular and midaxillary lines.  Assessment: Chest wall pain  Plan: X-ray  [facility] reading (PRIMARY) by  Dr. Alwyn Ren No fracture noted  Treat symptomatically .

## 2013-03-28 NOTE — Patient Instructions (Signed)
Take ibuprofen or Aleve as needed for pain  Return if acutely worse or if not getting better over the next couple of weeks  Apply ice to chest wall several times daily for 15 minutes or so  Practice deep breathing and/or coughing

## 2013-04-06 ENCOUNTER — Ambulatory Visit (INDEPENDENT_AMBULATORY_CARE_PROVIDER_SITE_OTHER): Payer: Medicare Other | Admitting: Emergency Medicine

## 2013-04-06 ENCOUNTER — Ambulatory Visit: Payer: Medicare Other

## 2013-04-06 VITALS — BP 122/60 | HR 60 | Temp 97.8°F | Resp 18 | Wt 153.0 lb

## 2013-04-06 DIAGNOSIS — J209 Acute bronchitis, unspecified: Secondary | ICD-10-CM | POA: Diagnosis not present

## 2013-04-06 DIAGNOSIS — R05 Cough: Secondary | ICD-10-CM | POA: Diagnosis not present

## 2013-04-06 DIAGNOSIS — R059 Cough, unspecified: Secondary | ICD-10-CM

## 2013-04-06 LAB — POCT CBC
Granulocyte percent: 56.3 %G (ref 37–80)
Lymph, poc: 2.1 (ref 0.6–3.4)
MCH, POC: 30.5 pg (ref 27–31.2)
MCHC: 31.1 g/dL — AB (ref 31.8–35.4)
MCV: 98 fL — AB (ref 80–97)
MID (cbc): 0.4 (ref 0–0.9)
POC LYMPH PERCENT: 36 %L (ref 10–50)
Platelet Count, POC: 191 10*3/uL (ref 142–424)
RDW, POC: 13.9 %
WBC: 5.8 10*3/uL (ref 4.6–10.2)

## 2013-04-06 MED ORDER — AZITHROMYCIN 250 MG PO TABS: ORAL_TABLET | ORAL | Status: DC

## 2013-04-06 MED ORDER — BENZONATATE 100 MG PO CAPS: 100.0000 mg | ORAL_CAPSULE | Freq: Three times a day (TID) | ORAL | Status: DC | PRN

## 2013-04-06 NOTE — Patient Instructions (Signed)

## 2013-04-06 NOTE — Progress Notes (Signed)
Subjective:    Patient ID: Julie Pacheco, female    DOB: [birth year], 67 y.o.   MRN: [id number] This chart was scribed for Collene Gobble, MD by Valera Castle, ED Scribe. This patient was seen in room 13 and the patient's care was started at 10:26 AM.  HPI Julie Pacheco is a 67 y.o. female who presents to the [facility] complaining of sudden, moderate, intermittent cough, productive of green sputum, with associated wheezing onset 2 days ago. She reports her grandson has recently had a cold. She denies these symptoms being correlated with her seasonal allergies.   She reports she was seen one week ago for torn cartilage in her rib, caused while lifting. She had an xray done there. She states that nothing was broken, just sore. She denies fever, and any other associated symptoms. She denies h/o smoking.   Patient Active Problem List   Diagnosis Date Noted  . 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 Insomnia 09/07/2011   Past Medical History  Diagnosis Date  . Migraines   . Thyroid disease    No past surgical history on file. No Known Allergies Prior to Admission medications   Medication Sig Start Date End Date Taking? Authorizing Provider  levothyroxine (SYNTHROID, LEVOTHROID) 50 MCG tablet Take 1 tablet (50 mcg total) by mouth daily before breakfast. 12/19/12  Yes Phillips Odor, MD  zolpidem (AMBIEN) 10 MG tablet As directed at bedtime 10/08/12  Yes Elvina Sidle, MD  zolpidem (AMBIEN) 5 MG tablet Take 1 tablet (5 mg total) by mouth at bedtime as needed for sleep. 03/23/13  Yes Elvina Sidle, MD  hydrOXYzine (ATARAX/VISTARIL) 25 MG tablet Take 0.5-1 tablets (12.5-25 mg total) by mouth every 8 (eight) hours as needed for itching. 10/21/12   Sherren Mocha, MD  predniSONE (DELTASONE) 10 MG tablet Take 6 tabs qd x2d, then 5tabs qd x 2d, then 4 tabs qd x 2d, then 3 tabs qd x 2d, then 2 tabs qd x 2d, then 1 tab qd x 1d 10/21/12   Sherren Mocha, MD    Review of Systems  Constitutional: Negative for fever.   Respiratory: Positive for cough (productive of green sputum) and wheezing.       Objective:   Physical Exam Nursing note and vitals reviewed. Constitutional: Pt is oriented to person, place, and time. Pt appears well-developed and well-nourished. No distress.  HENT:  Head: Normocephalic and atraumatic.  Mouth/Throat: Oropharynx is clear and moist.  Eyes: EOM are normal.  Neck: Neck supple. No tracheal deviation present.  Cardiovascular: Normal rate.   Pulmonary/Chest: Effort normal. No respiratory distress.  Wheezing noted to bilateral bases Musculoskeletal: Normal range of motion.  Neurological: Pt is alert and oriented to person, place, and time.  Skin: Skin is warm and dry.  Psychiatric: Pt has a normal mood and affect. Pt's behavior is normal.    [facility] reading (PRIMARY) by Dr. Cleta Alberts. There is possibly some mild increased markings in the right lower lobe no significant change from previous chest x-ray Results for orders placed in visit on 04/06/13  POCT CBC      Result Value Range   WBC 5.8  4.6 - 10.2 K/uL   Lymph, poc 2.1  0.6 - 3.4   POC LYMPH PERCENT 36.0  10 - 50 %L   MID (cbc) 0.4  0 - 0.9   POC MID % 7.7  0 - 12 %M   POC Granulocyte 3.3  2 - 6.9   Granulocyte percent 56.3  37 - 80 %G   RBC 4.49  4.04 - 5.48 M/uL   Hemoglobin 13.7  12.2 - 16.2 g/dL   HCT, POC 40.9  81.1 - 47.9 %   MCV 98.0 (*) 80 - 97 fL   MCH, POC 30.5  27 - 31.2 pg   MCHC 31.1 (*) 31.8 - 35.4 g/dL   RDW, POC 91.4     Platelet Count, POC 191  142 - 424 K/uL   MPV 10.9  0 - 99.8 fL    TriageBP 122/60  Pulse 60  Temp(Src) 97.8 F (36.6 C) (Oral)  Resp 18  Wt 153 lb (69.4 kg)  SpO2 97%     Assessment & Plan:   We'll treat with a Z-Pak. Tessalon Perles    I personally performed the services described in this documentation, which was scribed in my presence. The recorded information has been reviewed and is accurate.

## 2013-06-24 ENCOUNTER — Other Ambulatory Visit: Payer: Self-pay | Admitting: Family Medicine

## 2013-06-24 DIAGNOSIS — Z139 Encounter for screening, unspecified: Secondary | ICD-10-CM

## 2013-07-16 ENCOUNTER — Ambulatory Visit (HOSPITAL_COMMUNITY)
Admission: RE | Admit: 2013-07-16 | Discharge: 2013-07-16 | Disposition: A | Discharge status: Home / Self Care | Payer: Medicare Other | Source: Ambulatory Visit | Attending: Family Medicine | Admitting: Family Medicine

## 2013-07-16 ENCOUNTER — Other Ambulatory Visit: Payer: Self-pay | Admitting: Family Medicine

## 2013-07-16 DIAGNOSIS — Z1231 Encounter for screening mammogram for malignant neoplasm of breast: Secondary | ICD-10-CM

## 2013-07-28 DIAGNOSIS — R319 Hematuria, unspecified: Secondary | ICD-10-CM | POA: Diagnosis not present

## 2013-09-28 ENCOUNTER — Ambulatory Visit (INDEPENDENT_AMBULATORY_CARE_PROVIDER_SITE_OTHER): Payer: Medicare Other | Admitting: Family Medicine

## 2013-09-28 VITALS — BP 108/62 | HR 73 | Temp 98.8°F | Resp 18 | Ht 63.0 in | Wt 155.0 lb

## 2013-09-28 DIAGNOSIS — J209 Acute bronchitis, unspecified: Secondary | ICD-10-CM

## 2013-09-28 DIAGNOSIS — R059 Cough, unspecified: Secondary | ICD-10-CM

## 2013-09-28 DIAGNOSIS — R05 Cough: Secondary | ICD-10-CM

## 2013-09-28 MED ORDER — HYDROCODONE-HOMATROPINE 5-1.5 MG/5ML PO SYRP: 5.0000 mL | ORAL_SOLUTION | Freq: Three times a day (TID) | ORAL | Status: DC | PRN

## 2013-09-28 MED ORDER — DOXYCYCLINE HYCLATE 100 MG PO CAPS: 100.0000 mg | ORAL_CAPSULE | Freq: Two times a day (BID) | ORAL | Status: DC

## 2013-09-28 NOTE — Progress Notes (Signed)
[facility] [address] [place] [address] [phone]  Date:  09/28/2013   Name:  Julie Pacheco   DOB:  January 31, 1946   MRN:  [id number]  PCP:  Robyn Haber, MD    Chief Complaint: Cough   History of Present Illness:  Julie Pacheco is a 68 y.o. very pleasant female patient who presents with the following:  She has been coughing for about one week.  She will cough so hard she may gag, but is not generally producing anything.   She has not noted a fever, no aches but she does have chills.   She has noted sneezing, but no runny nose.  She did have a ST but this is now resolved.   No other stomach sx except for coughing till she gags.    Her only med right now is prn [place]    She had a pheo in 1996.  This is now resolved.    Patient Active Problem List   Diagnosis Date Noted  . 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 Insomnia 09/07/2011    Past Medical History  Diagnosis Date  . Migraines   . Thyroid disease     History reviewed. No pertinent past surgical history.  History  Substance Use Topics  . Smoking status: Former Smoker    Quit date: 12/15/1981  . Smokeless tobacco: Not on file  . Alcohol Use: Not on file    History reviewed. No pertinent family history.  No Known Allergies  Medication list has been reviewed and updated.  Current Outpatient Prescriptions on File Prior to Visit  Medication Sig Dispense Refill  . zolpidem (AMBIEN) 10 MG tablet As directed at bedtime  30 tablet  1  . zolpidem (AMBIEN) 5 MG tablet Take 1 tablet (5 mg total) by mouth at bedtime as needed for sleep.  30 tablet  5  . azithromycin (ZITHROMAX) 250 MG tablet Take 2 tabs PO x 1 dose, then 1 tab PO QD x 4 days  6 tablet  0  . benzonatate (TESSALON) 100 MG capsule Take 1-2 capsules (100-200 mg total) by mouth 3 (three) times daily as needed for cough.  40 capsule  0  . hydrOXYzine (ATARAX/VISTARIL) 25 MG tablet Take 0.5-1 tablets (12.5-25 mg total) by mouth  every 8 (eight) hours as needed for itching.  30 tablet  0  . levothyroxine (SYNTHROID, LEVOTHROID) 50 MCG tablet Take 1 tablet (50 mcg total) by mouth daily before breakfast.  30 tablet  12  . predniSONE (DELTASONE) 10 MG tablet Take 6 tabs qd x2d, then 5tabs qd x 2d, then 4 tabs qd x 2d, then 3 tabs qd x 2d, then 2 tabs qd x 2d, then 1 tab qd x 1d  42 tablet  0   No current facility-administered medications on file prior to visit.    Review of Systems:  As per HPI- otherwise negative.   Physical Examination: Filed Vitals:   09/28/13 1405  BP: 108/62  Pulse: 73  Temp: 98.8 F (37.1 C)  Resp: 18   Filed Vitals:   09/28/13 1405  Height: 5\' 3"  (1.6 m)  Weight: 155 lb (70.308 kg)   Body mass index is 27.46 kg/(m^2). Ideal Body Weight: Weight in (lb) to have BMI = 25: 140.8  GEN: WDWN, NAD, Non-toxic, A & O x 3, looks well HEENT: Atraumatic, Normocephalic. Neck supple. No masses, No LAD.    Bilateral TM wnl, oropharynx normal.  PEERL,EOMI.   Ears  and Nose: No external deformity. CV: RRR, No M/G/R. No JVD. No thrill. No extra heart sounds. PULM: CTA B, no wheezes, crackles, rhonchi. No retractions. No resp. distress. No accessory muscle use. EXTR: No c/c/e NEURO Normal gait.  PSYCH: Normally interactive. Conversant. Not depressed or anxious appearing.  Calm demeanor.    Assessment and Plan: Acute bronchitis - Plan: doxycycline (VIBRAMYCIN) 100 MG capsule  Cough - Plan: HYDROcodone-homatropine (HYCODAN) 5-1.5 MG/5ML syrup  Doxycycline as directed for bronchitis.  Cough syrup as needed- she will not combine this with ambien.  She will let me know if not better in the next few days- Sooner if worse.      Signed Lamar Blinks, MD

## 2013-09-28 NOTE — Patient Instructions (Signed)
We are going to treat you with doxycycline- an antibiotic- for bronchitis.  This will also take care of pneumonia.  Let me know if you do not feel better in the next few days- Sooner if worse.

## 2013-09-29 ENCOUNTER — Other Ambulatory Visit: Payer: Self-pay | Admitting: *Deleted

## 2013-09-29 DIAGNOSIS — D239 Other benign neoplasm of skin, unspecified: Secondary | ICD-10-CM | POA: Diagnosis not present

## 2013-09-29 DIAGNOSIS — L821 Other seborrheic keratosis: Secondary | ICD-10-CM | POA: Diagnosis not present

## 2013-09-29 MED ORDER — LEVOTHYROXINE SODIUM 50 MCG PO TABS: ORAL_TABLET | ORAL | Status: DC

## 2013-11-13 ENCOUNTER — Ambulatory Visit (INDEPENDENT_AMBULATORY_CARE_PROVIDER_SITE_OTHER): Payer: Medicare Other | Admitting: Emergency Medicine

## 2013-11-13 VITALS — BP 114/80 | HR 53 | Temp 97.4°F | Resp 16 | Ht 63.0 in | Wt 154.0 lb

## 2013-11-13 DIAGNOSIS — J029 Acute pharyngitis, unspecified: Secondary | ICD-10-CM

## 2013-11-13 DIAGNOSIS — R309 Painful micturition, unspecified: Secondary | ICD-10-CM

## 2013-11-13 DIAGNOSIS — R300 Dysuria: Secondary | ICD-10-CM | POA: Diagnosis not present

## 2013-11-13 DIAGNOSIS — R350 Frequency of micturition: Secondary | ICD-10-CM | POA: Diagnosis not present

## 2013-11-13 LAB — POCT CBC
GRANULOCYTE PERCENT: 62.5 % (ref 37–80)
HCT, POC: 41.9 % (ref 37.7–47.9)
Hemoglobin: 13.2 g/dL (ref 12.2–16.2)
Lymph, poc: 1.5 (ref 0.6–3.4)
MCH: 30.3 pg (ref 27–31.2)
MCHC: 31.5 g/dL — AB (ref 31.8–35.4)
MCV: 49.3 fL — AB (ref 80–97)
MID (CBC): 0.4 (ref 0–0.9)
MPV: 11.7 fL (ref 0–99.8)
PLATELET COUNT, POC: 204 10*3/uL (ref 142–424)
POC Granulocyte: 3.2 (ref 2–6.9)
POC LYMPH PERCENT: 29.7 %L (ref 10–50)
POC MID %: 7.8 % (ref 0–12)
RBC: 4.35 M/uL (ref 4.04–5.48)
RDW, POC: 13.5 %
WBC: 5.1 10*3/uL (ref 4.6–10.2)

## 2013-11-13 LAB — POCT URINALYSIS DIPSTICK
Bilirubin, UA: NEGATIVE
Glucose, UA: NEGATIVE
Ketones, UA: NEGATIVE
Nitrite, UA: NEGATIVE
Protein, UA: NEGATIVE
Spec Grav, UA: 1.01
Urobilinogen, UA: 1
pH, UA: 6

## 2013-11-13 LAB — POCT UA - MICROSCOPIC ONLY
Bacteria, U Microscopic: NEGATIVE
Casts, Ur, LPF, POC: NEGATIVE
Crystals, Ur, HPF, POC: NEGATIVE
Mucus, UA: NEGATIVE
Yeast, UA: NEGATIVE

## 2013-11-13 LAB — POCT RAPID STREP A (OFFICE): RAPID STREP A SCREEN: NEGATIVE

## 2013-11-13 NOTE — Progress Notes (Signed)
Subjective:    Patient ID: Julie Pacheco, female    DOB: 1946/03/19, 68 y.o.   MRN: [id number]  HPI 68 year old female pt presents with sore throat and urinary frequency. The sore throat started last night and the urinary frequency started four days ago. No low back pain or burning with urination. Mild lower abdominal discomfort. No fever. It is painful to wipe after urination. Her daughter stated last night that her throat was sore. Had a UTI about 6 months ago. Saw a urologist and was told to come back in 2 months for a re-check. No hx of kidney stones.     Review of Systems     Objective:   Physical Exam patient is alert and cooperative she is in no distress. Her neck is supple. TMs are clear. Nose is normal. Throat exam reveals surgically absent tonsils. There is minimal redness. Chest is clear to auscultation and percussion. Heart regular rate no murmurs. The abdomen is soft bowel sounds normal there are no areas of tenderness on exam the    Results for orders placed in visit on 11/13/13  POCT UA - MICROSCOPIC ONLY      Result Value Ref Range   WBC, Ur, HPF, POC 0-4     RBC, urine, microscopic 0-1     Bacteria, U Microscopic neg     Mucus, UA neg     Epithelial cells, urine per micros 1-8     Crystals, Ur, HPF, POC neg     Casts, Ur, LPF, POC neg     Yeast, UA neg    POCT URINALYSIS DIPSTICK      Result Value Ref Range   Color, UA yellow     Clarity, UA clear     Glucose, UA neg     Bilirubin, UA neg     Ketones, UA neg     Spec Grav, UA 1.010     Blood, UA trace-lysed     pH, UA 6.0     Protein, UA neg     Urobilinogen, UA 1.0     Nitrite, UA neg     Leukocytes, UA Trace    POCT RAPID STREP A (OFFICE)      Result Value Ref Range   Rapid Strep A Screen Negative  Negative   Results for orders placed in visit on 11/13/13  POCT UA - MICROSCOPIC ONLY      Result Value Ref Range   WBC, Ur, HPF, POC 0-4     RBC, urine, microscopic 0-1     Bacteria, U Microscopic  neg     Mucus, UA neg     Epithelial cells, urine per micros 1-8     Crystals, Ur, HPF, POC neg     Casts, Ur, LPF, POC neg     Yeast, UA neg    POCT URINALYSIS DIPSTICK      Result Value Ref Range   Color, UA yellow     Clarity, UA clear     Glucose, UA neg     Bilirubin, UA neg     Ketones, UA neg     Spec Grav, UA 1.010     Blood, UA trace-lysed     pH, UA 6.0     Protein, UA neg     Urobilinogen, UA 1.0     Nitrite, UA neg     Leukocytes, UA Trace    POCT RAPID STREP A (OFFICE)      Result  Value Ref Range   Rapid Strep A Screen Negative  Negative  POCT CBC      Result Value Ref Range   WBC 5.1  4.6 - 10.2 K/uL   Lymph, poc 1.5  0.6 - 3.4   POC LYMPH PERCENT 29.7  10 - 50 %L   MID (cbc) 0.4  0 - 0.9   POC MID % 7.8  0 - 12 %M   POC Granulocyte 3.2  2 - 6.9   Granulocyte percent 62.5  37 - 80 %G   RBC 4.35  4.04 - 5.48 M/uL   Hemoglobin 13.2  12.2 - 16.2 g/dL   HCT, POC 41.9  37.7 - 47.9 %   MCV 49.3 (*) 80 - 97 fL   MCH, POC 30.3  27 - 31.2 pg   MCHC 31.5 (*) 31.8 - 35.4 g/dL   RDW, POC 13.5     Platelet Count, POC 204  142 - 424 K/uL   MPV 11.7  0 - 99.8 fL       Assessment & Plan:  We'll follow up with urine culture. I advised her to try some GYN Lotrimin cream to the area around the introitus. I will contact her once her cultures back. If the symptoms do not resolve with GYN Lotrimin cream would advise a follow Diflucan 150 repeat in one week .

## 2013-11-14 LAB — URINE CULTURE: Colony Count: 15000

## 2013-11-15 LAB — CULTURE, GROUP A STREP: Organism ID, Bacteria: NORMAL

## 2013-11-17 ENCOUNTER — Other Ambulatory Visit: Payer: Self-pay | Admitting: Radiology

## 2013-11-17 DIAGNOSIS — B379 Candidiasis, unspecified: Secondary | ICD-10-CM

## 2013-11-17 MED ORDER — FLUCONAZOLE 150 MG PO TABS: 150.0000 mg | ORAL_TABLET | Freq: Once | ORAL | Status: DC

## 2014-02-03 ENCOUNTER — Ambulatory Visit (INDEPENDENT_AMBULATORY_CARE_PROVIDER_SITE_OTHER): Payer: Medicare Other | Admitting: Family Medicine

## 2014-02-03 VITALS — BP 118/68 | HR 47 | Temp 98.0°F | Resp 18 | Ht 63.0 in | Wt 154.0 lb

## 2014-02-03 DIAGNOSIS — E039 Hypothyroidism, unspecified: Secondary | ICD-10-CM | POA: Diagnosis not present

## 2014-02-03 DIAGNOSIS — Z87448 Personal history of other diseases of urinary system: Secondary | ICD-10-CM

## 2014-02-03 LAB — POCT UA - MICROSCOPIC ONLY
Casts, Ur, LPF, POC: NEGATIVE
Crystals, Ur, HPF, POC: NEGATIVE
Yeast, UA: NEGATIVE

## 2014-02-03 LAB — POCT URINALYSIS DIPSTICK
Bilirubin, UA: NEGATIVE
Glucose, UA: NEGATIVE
Ketones, UA: NEGATIVE
Leukocytes, UA: NEGATIVE
Nitrite, UA: NEGATIVE
Protein, UA: NEGATIVE
Spec Grav, UA: 1.015
Urobilinogen, UA: 0.2
pH, UA: 5

## 2014-02-03 MED ORDER — LEVOTHYROXINE SODIUM 50 MCG PO TABS: ORAL_TABLET | ORAL | Status: DC

## 2014-02-03 NOTE — Progress Notes (Signed)
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 She has not noticed any change in her energy level, bowel activity, cold intolerance.  Patient has had a h/o hematuria.  She needs a urine follow up  Objective: No acute distress, patient tearful with good eye contact. HEENT: Unremarkable Chest: Clear Heart: Regular no murmur Skin: Warm and dry without rash Neck: Supple no adenopathy or thyromegaly  Assessment: Chronic hypothyroidism  Plan: Check TSH and refill thyroid medicine Hypothyroidism, unspecified hypothyroidism type - Plan: TSH, levothyroxine (SYNTHROID, LEVOTHROID) 50 MCG tablet  H/O hematuria - Plan: POCT urinalysis dipstick, POCT UA - Microscopic Only   Signed Robyn Haber M.D.

## 2014-02-04 LAB — TSH: TSH: 2.995 u[IU]/mL (ref 0.350–4.500)

## 2014-02-09 ENCOUNTER — Other Ambulatory Visit: Payer: Self-pay | Admitting: Physician Assistant

## 2014-05-10 ENCOUNTER — Telehealth: Payer: Self-pay

## 2014-05-10 NOTE — Telephone Encounter (Signed)
Spoke to patient, she states that she does not want to get a flu shot this year.

## 2014-06-01 DIAGNOSIS — L821 Other seborrheic keratosis: Secondary | ICD-10-CM | POA: Diagnosis not present

## 2014-06-01 DIAGNOSIS — D239 Other benign neoplasm of skin, unspecified: Secondary | ICD-10-CM | POA: Diagnosis not present

## 2014-06-17 ENCOUNTER — Other Ambulatory Visit: Payer: Self-pay

## 2014-06-17 DIAGNOSIS — Z1231 Encounter for screening mammogram for malignant neoplasm of breast: Secondary | ICD-10-CM

## 2014-07-20 ENCOUNTER — Ambulatory Visit: Payer: Medicare Other

## 2014-08-31 ENCOUNTER — Ambulatory Visit
Admission: RE | Admit: 2014-08-31 | Discharge: 2014-08-31 | Disposition: A | Discharge status: Home / Self Care | Payer: Medicare Other | Source: Ambulatory Visit

## 2014-08-31 ENCOUNTER — Other Ambulatory Visit: Payer: Self-pay | Admitting: Physician Assistant

## 2014-08-31 DIAGNOSIS — Z1231 Encounter for screening mammogram for malignant neoplasm of breast: Secondary | ICD-10-CM

## 2014-09-05 ENCOUNTER — Other Ambulatory Visit: Payer: Self-pay | Admitting: Physician Assistant

## 2014-10-04 ENCOUNTER — Ambulatory Visit (INDEPENDENT_AMBULATORY_CARE_PROVIDER_SITE_OTHER): Payer: Medicare Other | Admitting: Physician Assistant

## 2014-10-04 VITALS — BP 100/66 | HR 53 | Temp 98.5°F | Resp 18 | Ht 63.0 in | Wt 152.0 lb

## 2014-10-04 DIAGNOSIS — Z1212 Encounter for screening for malignant neoplasm of rectum: Secondary | ICD-10-CM

## 2014-10-04 DIAGNOSIS — Z1211 Encounter for screening for malignant neoplasm of colon: Secondary | ICD-10-CM

## 2014-10-04 DIAGNOSIS — Z13820 Encounter for screening for osteoporosis: Secondary | ICD-10-CM

## 2014-10-04 DIAGNOSIS — R319 Hematuria, unspecified: Secondary | ICD-10-CM

## 2014-10-04 DIAGNOSIS — E039 Hypothyroidism, unspecified: Secondary | ICD-10-CM

## 2014-10-04 DIAGNOSIS — E2839 Other primary ovarian failure: Secondary | ICD-10-CM | POA: Diagnosis not present

## 2014-10-04 DIAGNOSIS — Z13228 Encounter for screening for other metabolic disorders: Secondary | ICD-10-CM

## 2014-10-04 LAB — POCT URINALYSIS DIPSTICK
Bilirubin, UA: NEGATIVE
Glucose, UA: NEGATIVE
KETONES UA: NEGATIVE
Leukocytes, UA: NEGATIVE
Nitrite, UA: NEGATIVE
Protein, UA: NEGATIVE
SPEC GRAV UA: 1.02
UROBILINOGEN UA: 0.2
pH, UA: 5.5

## 2014-10-04 LAB — POCT UA - MICROSCOPIC ONLY
Casts, Ur, LPF, POC: NEGATIVE
Crystals, Ur, HPF, POC: NEGATIVE
MUCUS UA: NEGATIVE
RBC, urine, microscopic: NEGATIVE
Yeast, UA: NEGATIVE

## 2014-10-04 LAB — CBC
HCT: 40.7 % (ref 36.0–46.0)
Hemoglobin: 13.8 g/dL (ref 12.0–15.0)
MCH: 30.9 pg (ref 26.0–34.0)
MCHC: 33.9 g/dL (ref 30.0–36.0)
MCV: 91.3 fL (ref 78.0–100.0)
MPV: 12 fL (ref 8.6–12.4)
PLATELETS: 198 10*3/uL (ref 150–400)
RBC: 4.46 MIL/uL (ref 3.87–5.11)
RDW: 13.1 % (ref 11.5–15.5)
WBC: 6.2 10*3/uL (ref 4.0–10.5)

## 2014-10-04 LAB — COMPLETE METABOLIC PANEL WITH GFR
ALK PHOS: 59 U/L (ref 39–117)
ALT: 19 U/L (ref 0–35)
AST: 18 U/L (ref 0–37)
Albumin: 4.1 g/dL (ref 3.5–5.2)
BILIRUBIN TOTAL: 0.7 mg/dL (ref 0.2–1.2)
BUN: 12 mg/dL (ref 6–23)
CO2: 28 mEq/L (ref 19–32)
Calcium: 9.1 mg/dL (ref 8.4–10.5)
Chloride: 103 mEq/L (ref 96–112)
Creat: 0.8 mg/dL (ref 0.50–1.10)
GFR, EST AFRICAN AMERICAN: 86 mL/min
GFR, EST NON AFRICAN AMERICAN: 75 mL/min
GLUCOSE: 92 mg/dL (ref 70–99)
Potassium: 4 mEq/L (ref 3.5–5.3)
SODIUM: 138 meq/L (ref 135–145)
Total Protein: 7.2 g/dL (ref 6.0–8.3)

## 2014-10-04 LAB — TSH: TSH: 1.757 u[IU]/mL (ref 0.350–4.500)

## 2014-10-04 NOTE — Patient Instructions (Addendum)
I will have your lab results within 10 days.   I will also check for your colonoscopy and update you on your next recheck.  Please await referral for dexa imaging.

## 2014-10-04 NOTE — Progress Notes (Signed)
[facility] [address] [place] [address] [phone]  Date:  10/04/2014   Name:  Julie Pacheco   DOB:  [birth year]   MRN:  [id number]  PCP:  Robyn Haber, MD    Chief Complaint: Medication Refill and tsh   History of Present Illness:  Julie Pacheco is a 69 y.o. very pleasant female patient who presents with the following:  Patient is here for medication refill.   She is compliant and tolerating the levothyroxine well.   She has no symptoms of hypothyroidism that is known to her, such as dizziness, fatigue, constipation, sob, dry skin, or depressive symptoms.   She denies anxiety, ddiarrhea, nausea, or vomiting.     Patient Active Problem List   Diagnosis Date Noted  . 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 Insomnia 09/07/2011    Past Medical History  Diagnosis Date  . Migraines   . Thyroid disease     History reviewed. No pertinent past surgical history.  History  Substance Use Topics  . Smoking status: Former Smoker    Quit date: 12/15/1981  . Smokeless tobacco: Not on file  . Alcohol Use: Not on file    History reviewed. No pertinent family history.  No Known Allergies  Medication list has been reviewed and updated.  Current Outpatient Prescriptions on File Prior to Visit  Medication Sig Dispense Refill  . levothyroxine (SYNTHROID, LEVOTHROID) 50 MCG tablet TAKE ONE TABLET BY MOUTH ONCE DAILY BEFORE BREAKFAST.  "OV NEEDED FOR ADDITIONAL REFILLS" 2ND 30 tablet 0   No current facility-administered medications on file prior to visit.    Review of Systems: ROS otherwise unremarkable unless listed above.  Physical Examination: Filed Vitals:   10/04/14 0939  BP: 100/66  Pulse: 53  Temp: 98.5 F (36.9 C)  Resp: 18   Filed Vitals:   10/04/14 0939  Height: 5\' 3"  (1.6 m)  Weight: 152 lb (68.947 kg)   Body mass index is 26.93 kg/(m^2). Ideal Body Weight: Weight in (lb) to have BMI = 25: 140.8  Physical Exam   Constitutional: She is oriented to person, place, and time. She appears well-developed and well-nourished. No distress.  HENT:  Head: Normocephalic and atraumatic.  Right Ear: External ear normal.  Left Ear: External ear normal.  Eyes: Pupils are equal, round, and reactive to light. Right eye exhibits no discharge.  Neck: Normal range of motion. No thyromegaly present.  Cardiovascular: Normal rate, regular rhythm, normal heart sounds and intact distal pulses.  Exam reveals no friction rub.   No murmur heard. Pulmonary/Chest: Effort normal and breath sounds normal. No respiratory distress. She has no wheezes.  Musculoskeletal: She exhibits no edema (No lower extremity edema).  Neurological: She is alert and oriented to person, place, and time. No cranial nerve deficit.  Skin: Skin is warm and dry. No rash noted. She is not diaphoretic. No erythema.  Psychiatric: She has a normal mood and affect. Her behavior is normal.     Assessment and Plan: 69 year old female is here today for chief concern of refilling thyroid medications.   -I will place order for bone density scanning per age screening guidelines. -Thyroid medication refill pending TSH results   -She has a hx of hematuria and I will culture.  She has been seen by urology prior, and if no growth will seek consult.   Last colonoscopy in 01/29/2010 indicates followup screening in 3-5 years so we will schedule for this September (4 months).  Hypothyroidism, unspecified hypothyroidism type - Plan: TSH, CBC, COMPLETE METABOLIC PANEL WITH GFR, DG Bone Density, POCT urinalysis dipstick, POCT UA - Microscopic Only  Screening for metabolic disorder - Plan: COMPLETE METABOLIC PANEL WITH GFR, POCT urinalysis dipstick, POCT UA - Microscopic Only  Screening for osteoporosis - Plan: DG Bone Density  Estrogen deficiency - Plan: DG Bone Density  Hematuria - Plan: Urine culture  Screening for colorectal cancer - Plan: Ambulatory referral to  Gastroenterology  Ivar Drape, PA-C [facility] 5/9/20163:03 PM

## 2014-10-05 ENCOUNTER — Other Ambulatory Visit: Payer: Self-pay | Admitting: Family Medicine

## 2014-10-05 DIAGNOSIS — E039 Hypothyroidism, unspecified: Secondary | ICD-10-CM

## 2014-10-05 LAB — URINE CULTURE: Colony Count: 40000

## 2014-10-05 NOTE — Telephone Encounter (Signed)
Pt called. She is going OOT. Her TSH is WNL called in #30 of 42mcg until you get a chance to look at this and decide how many RF's to give her and if you need to change the dose.

## 2014-10-05 NOTE — Telephone Encounter (Signed)
Julie Pacheco, her labs are back. Do we need to make medication changes? Refill request from pharmacy pended.

## 2014-10-06 NOTE — Telephone Encounter (Signed)
I filled the prescription for 90 day supply.  Thanks for sending the 30.  She can return in about 6 months for recheck, however she has been stable for years.

## 2014-10-14 ENCOUNTER — Telehealth: Payer: Self-pay

## 2014-10-14 NOTE — Telephone Encounter (Signed)
Pt calling about labs. Please review. Thanks  

## 2014-10-17 ENCOUNTER — Other Ambulatory Visit: Payer: Self-pay | Admitting: Physician Assistant

## 2014-10-17 DIAGNOSIS — E785 Hyperlipidemia, unspecified: Secondary | ICD-10-CM

## 2014-10-17 NOTE — Progress Notes (Unsigned)
Spoke with patient of lab results.  Normal tsh, liver, kidney, and electrolyte function.  Patient reports that she only had a 30 day filled, though she was given 90 tablets.  Urine culture was multiple organisms.  Her lab cultures have presented a similar finding as evaluation.  I will confirm with urology that nothing more is needed at this time. I am placing a future lipid order.

## 2014-10-20 DIAGNOSIS — M810 Age-related osteoporosis without current pathological fracture: Secondary | ICD-10-CM | POA: Diagnosis not present

## 2014-10-22 ENCOUNTER — Other Ambulatory Visit (INDEPENDENT_AMBULATORY_CARE_PROVIDER_SITE_OTHER): Payer: Medicare Other | Admitting: Radiology

## 2014-10-22 DIAGNOSIS — E785 Hyperlipidemia, unspecified: Secondary | ICD-10-CM

## 2014-10-22 LAB — LIPID PANEL
Cholesterol: 204 mg/dL — ABNORMAL HIGH (ref 0–200)
HDL: 56 mg/dL (ref >=46)
LDL CALC: 130 mg/dL — AB (ref 0–99)
Total CHOL/HDL Ratio: 3.6 Ratio
Triglycerides: 92 mg/dL (ref <150)
VLDL: 18 mg/dL (ref 0–40)

## 2014-10-22 NOTE — Progress Notes (Signed)
Pt here for labs only. 

## 2014-10-28 NOTE — Telephone Encounter (Signed)
Pt is calling about lab results  

## 2014-11-02 NOTE — Telephone Encounter (Signed)
Also wants to know bone density results as well please. Thanks

## 2014-11-02 NOTE — Telephone Encounter (Signed)
Julie Pacheco, please review. Thanks. Third phone call from patient.

## 2014-11-06 NOTE — Telephone Encounter (Signed)
Left message with husband: Her metabolic panel was normal with appropriate kidney and liver function.  Cbc showed no sign of anemia or infection.  TSH was normal.  (we had spoken of these results prior).  The lab was the lipid panel which was unremarkable.  LDL mildly elevated and the cholesterol.  Her HDL is good and compensates for the bad cholesterol (LDL), at this time we do not offer any kind of change besides more avoidance of fried fatty foods, and refined sugars such as soft drinks and fake juices, sweet teas, etc.   -Spoke with a doctor, and your urine is not expressing anything new, that warrants a return to urologist.

## 2014-11-08 ENCOUNTER — Telehealth: Payer: Self-pay

## 2014-11-08 DIAGNOSIS — D485 Neoplasm of uncertain behavior of skin: Secondary | ICD-10-CM | POA: Diagnosis not present

## 2014-11-08 DIAGNOSIS — L820 Inflamed seborrheic keratosis: Secondary | ICD-10-CM | POA: Diagnosis not present

## 2014-11-08 DIAGNOSIS — L821 Other seborrheic keratosis: Secondary | ICD-10-CM | POA: Diagnosis not present

## 2014-11-08 DIAGNOSIS — D239 Other benign neoplasm of skin, unspecified: Secondary | ICD-10-CM | POA: Diagnosis not present

## 2014-11-08 NOTE — Telephone Encounter (Signed)
Pt is wanting the results of her bone density. She said it was done approx 3 weeks go

## 2014-11-09 ENCOUNTER — Other Ambulatory Visit: Payer: Self-pay | Admitting: Radiology

## 2014-11-09 ENCOUNTER — Other Ambulatory Visit: Payer: Self-pay | Admitting: Family Medicine

## 2014-11-09 DIAGNOSIS — M810 Age-related osteoporosis without current pathological fracture: Secondary | ICD-10-CM

## 2014-11-09 NOTE — Telephone Encounter (Signed)
Don't see this in epic. Looked for report in stephanie's box, nothing. Pt thinks this was done at the [facility] and we need to call them 11/10/14 to get report.

## 2014-12-03 ENCOUNTER — Ambulatory Visit (INDEPENDENT_AMBULATORY_CARE_PROVIDER_SITE_OTHER): Payer: Medicare Other | Admitting: Internal Medicine

## 2014-12-03 ENCOUNTER — Encounter: Payer: Self-pay | Admitting: Internal Medicine

## 2014-12-03 VITALS — BP 102/60 | HR 65 | Temp 97.5°F | Resp 12 | Ht 63.0 in | Wt 154.8 lb

## 2014-12-03 DIAGNOSIS — M810 Age-related osteoporosis without current pathological fracture: Secondary | ICD-10-CM | POA: Diagnosis not present

## 2014-12-03 DIAGNOSIS — E559 Vitamin D deficiency, unspecified: Secondary | ICD-10-CM

## 2014-12-03 DIAGNOSIS — M8580 Other specified disorders of bone density and structure, unspecified site: Secondary | ICD-10-CM | POA: Insufficient documentation

## 2014-12-03 LAB — VITAMIN D 25 HYDROXY (VIT D DEFICIENCY, FRACTURES): VITD: 15.62 ng/mL — ABNORMAL LOW (ref 30.00–100.00)

## 2014-12-03 MED ORDER — VITAMIN D (ERGOCALCIFEROL) 1.25 MG (50000 UNIT) PO CAPS: 50000.0000 [IU] | ORAL_CAPSULE | ORAL | Status: DC
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
## 2014-12-03 NOTE — Patient Instructions (Signed)
Please stop at the lab.  Please return in 1 year.  How Can I Prevent Falls? Men and women with osteoporosis need to take care not to fall down. Falls can break bones. Some reasons people fall are: Poor vision  Poor balance  Certain diseases that affect how you walk  Some types of medicine, such as sleeping pills.  Some tips to help prevent falls outdoors are: Use a cane or walker  Wear rubber-soled shoes so you don't slip  Walk on grass when sidewalks are slippery  In winter, put salt or kitty litter on icy sidewalks.  Some ways to help prevent falls indoors are: Keep rooms free of clutter, especially on floors  Use plastic or carpet runners on slippery floors  Wear low-heeled shoes that provide good support  Do not walk in socks, stockings, or slippers  Be sure carpets and area rugs have skid-proof backs or are tacked to the floor  Be sure stairs are well lit and have rails on both sides  Put grab bars on bathroom walls near tub, shower, and toilet  Use a rubber bath mat in the shower or tub  Keep a flashlight next to your bed  Use a sturdy step stool with a handrail and wide steps  Add more lights in rooms (and night lights) Buy a cordless phone to keep with you so that you don't have to rush to the phone       when it rings and so that you can call for help if you fall.   (adapted from http://www.niams.nih.gov/Health_Info/Bone/Osteoporosis/osteoporosis_ff.asp)  Dietary sources of calcium and vitamin D:  Calcium content (mg) - http://www.niams.nih.gov/Health_Info/Bone/Bone_Health  Fortified oatmeal, 1 packet 350  Sardines, canned in oil, with edible bones, 3 oz. 324  Cheddar cheese, 1 oz. shredded 306  Milk, nonfat, 1 cup 302  Milkshake, 1 cup 300  Yogurt, plain, low-fat, 1 cup 300  Soybeans, cooked, 1 cup 261  Tofu, firm, with calcium,  cup 204  Orange juice, fortified with calcium, 6 oz. 200-260 (varies)  Salmon, canned, with edible bones, 3 oz. 181  Pudding,  instant, made with 2% milk,  cup 153  Baked beans, 1 cup 142  Cottage cheese, 1% milk fat, 1 cup 138  Spaghetti, lasagna, 1 cup 125  Frozen yogurt, vanilla, soft-serve,  cup 103  Ready-to-eat cereal, fortified with calcium, 1 cup 100-1,000 (varies)  Cheese pizza, 1 slice 100  Fortified waffles, 2 100  Turnip greens, boiled,  cup 99  Broccoli, raw, 1 cup 90  Ice cream, vanilla,  cup 85  Soy or rice milk, fortified with calcium, 1 cup 80-500 (varies)   Vitamin D content (International Units, IU) - https://www.ars.usda.gov Cod liver oil, 1 tablespoon 1,360  Swordfish, cooked, 3 oz 566  Salmon (sockeye), cooked, 3 oz 447  Tuna fish, canned in water, drained, 3 oz 154  Orange juice fortified with vitamin D, 1 cup (check product labels, as amount of added vitamin D varies) 137  Milk, nonfat, reduced fat, and whole, vitamin D-fortified, 1 cup 115-124  Yogurt, fortified with 20% of the daily value for vitamin D, 6 oz 80  Margarine, fortified, 1 tablespoon 60  Sardines, canned in oil, drained, 2 sardines 46  Liver, beef, cooked, 3 oz 42  Egg, 1 large (vitamin D is found in yolk) 41  Ready-to-eat cereal, fortified with 10% of the daily value for vitamin D, 0.75-1 cup  40  Cheese, Swiss, 1 oz 6     Exercise for Strong   Bones (from National Osteoporosis Foundation) There are two types of exercises that are important for building and maintaining bone density:  weight-bearing and muscle-strengthening exercises. Weight-bearing Exercises These exercises include activities that make you move against gravity while staying upright. Weight-bearing exercises can be high-impact or low-impact. High-impact weight-bearing exercises help build bones and keep them strong. If you have broken a bone due to osteoporosis or are at risk of breaking a bone, you may need to avoid high-impact exercises. If you're not sure, you should check with your healthcare provider. Examples of high-impact weight-bearing  exercises are: . Dancing . Doing high-impact aerobics . Hiking . Jogging/running . Jumping Rope . Stair climbing . Tennis Low-impact weight-bearing exercises can also help keep bones strong and are a safe alternative if you cannot do high-impact exercises. Examples of low-impact weight-bearing exercises are: . Using elliptical training machines . Doing low-impact aerobics . Using stair-step machines . Fast walking on a treadmill or outside Muscle-Strengthening Exercises These exercises include activities where you move your body, a weight or some other resistance against gravity. They are also known as resistance exercises and include: . Lifting weights . Using elastic exercise bands . Using weight machines . Lifting your own body weight . Functional movements, such as standing and rising up on your toes Yoga and Pilates can also improve strength, balance and flexibility. However, certain positions may not be safe for people with osteoporosis or those at increased risk of broken bones. For example, exercises that have you bend forward may increase the chance of breaking a bone in the spine. A physical therapist should be able to help you learn which exercises are safe and appropriate for you. Non-Impact Exercises Non-impact exercises can help you to improve balance, posture and how well you move in everyday activities. These exercises can also help to increase muscle strength and decrease the risk of falls and broken bones. Some of these exercises include: . Balance exercises that strengthen your legs and test your balance, such as Tai Chi, can decrease your risk of falls. . Posture exercises that improve your posture and reduce rounded or "sloping" shoulders can help you decrease the chance of breaking a bone, especially in the spine. . Functional exercises that improve how well you move can help you with everyday activities and decrease your chance of falling and breaking a bone. For  example, if you have trouble getting up from a chair or climbing stairs, you should do these activities as exercises. A physical therapist can teach you balance, posture and functional exercises. Starting a New Exercise Program If you haven't exercised regularly for a while, check with your healthcare provider before beginning a new exercise program-particularly if you have health problems such as heart disease, diabetes or high blood pressure. If you're at high risk of breaking a bone, you should work with a physical therapist to develop a safe exercise program. Once you have your healthcare provider's approval, start slowly. If you've already broken bones in the spine because of osteoporosis, be very careful to avoid activities that require reaching down, bending forward, rapid twisting motions, heavy lifting and those that increase your chance of a fall. As you get started, your muscles may feel sore for a day or two after you exercise. If soreness lasts longer, you may be working too hard and need to ease up. Exercises should be done in a pain-free range of motion. How Much Exercise Do You Need? Weight-bearing exercises 30 minutes on most days of the week.   Do a 30-minutesession or multiple sessions spread out throughout the day. The benefits to your bones are the same.   Muscle-strengthening exercises Two to three days per week. If you don't have much time for strengthening/resistance training, do small amounts at a time. You can do just one body part each day. For example do arms one day, legs the next and trunk the next. You can also spread these exercises out during your normal day.  Balance, posture and functional exercises Every day or as often as needed. You may want to focus on one area more than the others. If you have fallen or lose your balance, spend time doing balance exercises. If you are getting rounded shoulders, work more on posture exercises. If you have trouble climbing stairs or  getting up from the couch, do more functional exercises. You can also perform these exercises at one time or spread them during your day. Work with a phyiscal therapist to learn the right exercises for you.     

## 2015-02-03 DIAGNOSIS — Z1211 Encounter for screening for malignant neoplasm of colon: Secondary | ICD-10-CM | POA: Diagnosis not present

## 2015-02-03 DIAGNOSIS — R194 Change in bowel habit: Secondary | ICD-10-CM | POA: Diagnosis not present

## 2015-02-03 DIAGNOSIS — Z800 Family history of malignant neoplasm of digestive organs: Secondary | ICD-10-CM | POA: Diagnosis not present

## 2015-02-03 DIAGNOSIS — R152 Fecal urgency: Secondary | ICD-10-CM | POA: Diagnosis not present

## 2015-03-09 DIAGNOSIS — K635 Polyp of colon: Secondary | ICD-10-CM | POA: Diagnosis not present

## 2015-03-09 DIAGNOSIS — D122 Benign neoplasm of ascending colon: Secondary | ICD-10-CM | POA: Diagnosis not present

## 2015-03-09 DIAGNOSIS — Z1211 Encounter for screening for malignant neoplasm of colon: Secondary | ICD-10-CM | POA: Diagnosis not present

## 2015-03-09 DIAGNOSIS — Z800 Family history of malignant neoplasm of digestive organs: Secondary | ICD-10-CM | POA: Diagnosis not present

## 2015-03-09 DIAGNOSIS — Z86010 Personal history of colonic polyps: Secondary | ICD-10-CM | POA: Diagnosis not present

## 2015-07-19 ENCOUNTER — Other Ambulatory Visit: Payer: Self-pay | Admitting: Physician Assistant

## 2015-07-21 ENCOUNTER — Other Ambulatory Visit: Payer: Self-pay | Admitting: Physician Assistant

## 2015-08-15 ENCOUNTER — Other Ambulatory Visit: Payer: Self-pay

## 2015-08-15 DIAGNOSIS — Z1231 Encounter for screening mammogram for malignant neoplasm of breast: Secondary | ICD-10-CM

## 2015-09-01 ENCOUNTER — Ambulatory Visit
Admission: RE | Admit: 2015-09-01 | Discharge: 2015-09-01 | Disposition: A | Discharge status: Home / Self Care | Payer: Medicare Other | Source: Ambulatory Visit

## 2015-09-01 DIAGNOSIS — Z1231 Encounter for screening mammogram for malignant neoplasm of breast: Secondary | ICD-10-CM

## 2015-10-29 ENCOUNTER — Telehealth: Payer: Self-pay | Admitting: Radiology

## 2015-10-29 NOTE — Telephone Encounter (Signed)
Phone call pt calling for refill/ advised her to come in for refills/ has been greater than a year since her last visit. Encouraged her to establish with another provider, as Dr Joseph Art has retired. She asked about Dr Everlene Farrier, I advised her best to pick another provider, unfortunately he is retiring as well

## 2015-10-31 ENCOUNTER — Telehealth: Payer: Self-pay

## 2015-10-31 ENCOUNTER — Ambulatory Visit (INDEPENDENT_AMBULATORY_CARE_PROVIDER_SITE_OTHER): Payer: Medicare Other | Admitting: Physician Assistant

## 2015-10-31 VITALS — BP 118/70 | HR 92 | Temp 98.1°F | Resp 18 | Ht 63.0 in | Wt 161.2 lb

## 2015-10-31 DIAGNOSIS — Q680 Congenital deformity of sternocleidomastoid muscle: Secondary | ICD-10-CM

## 2015-10-31 DIAGNOSIS — Z13220 Encounter for screening for lipoid disorders: Secondary | ICD-10-CM | POA: Diagnosis not present

## 2015-10-31 DIAGNOSIS — M542 Cervicalgia: Secondary | ICD-10-CM

## 2015-10-31 DIAGNOSIS — Z1159 Encounter for screening for other viral diseases: Secondary | ICD-10-CM | POA: Diagnosis not present

## 2015-10-31 DIAGNOSIS — E039 Hypothyroidism, unspecified: Secondary | ICD-10-CM | POA: Diagnosis not present

## 2015-10-31 DIAGNOSIS — M8580 Other specified disorders of bone density and structure, unspecified site: Secondary | ICD-10-CM

## 2015-10-31 DIAGNOSIS — E559 Vitamin D deficiency, unspecified: Secondary | ICD-10-CM

## 2015-10-31 DIAGNOSIS — Z6828 Body mass index (BMI) 28.0-28.9, adult: Secondary | ICD-10-CM | POA: Insufficient documentation

## 2015-10-31 LAB — TSH: TSH: 3.71 m[IU]/L

## 2015-10-31 LAB — HEPATITIS C ANTIBODY: HCV AB: NEGATIVE

## 2015-10-31 LAB — LIPID PANEL
Cholesterol: 198 mg/dL (ref 125–200)
HDL: 64 mg/dL (ref >=46)
LDL CALC: 113 mg/dL (ref <130)
Total CHOL/HDL Ratio: 3.1 Ratio (ref <=5.0)
Triglycerides: 106 mg/dL (ref <150)
VLDL: 21 mg/dL (ref <30)

## 2015-10-31 MED ORDER — LEVOTHYROXINE SODIUM 50 MCG PO TABS: ORAL_TABLET | ORAL | Status: DC

## 2015-10-31 MED ORDER — CYCLOBENZAPRINE HCL 10 MG PO TABS: 10.0000 mg | ORAL_TABLET | Freq: Three times a day (TID) | ORAL | Status: DC | PRN

## 2015-10-31 MED ORDER — MELOXICAM 15 MG PO TABS: 15.0000 mg | ORAL_TABLET | Freq: Every day | ORAL | Status: DC

## 2015-10-31 NOTE — Telephone Encounter (Signed)
Pt is at the pharmacy and is needing her flexeril and muscle relaxer sent in she states she had one medication but not all three -she just saw chelle

## 2015-10-31 NOTE — Progress Notes (Addendum)
Patient ID: Julie Pacheco, female    DOB: [birth year], 70 y.o.   MRN: [id number]  PCP: Robyn Haber, MD  Subjective:   Chief Complaint  Patient presents with  . Medication Refill    levothroxine 42mcg  . neck pain    left side x4 days    HPI Presents for evaluation of LEFT sided neck pain x 4-5 days. Went for a massage, which helped somewhat, but symptoms worsened again. No benefit from acetaminophen. No radicular symptoms.  Declines vaccinations (pneumovax, zostavax, Tdap), "The less you put into your body, the better."   Review of Systems As above, otherwise negative.    Patient Active Problem List   Diagnosis Date Noted  . Hypothyroidism 10/31/2015  . Vitamin D deficiency 10/31/2015  . Osteopenia 12/03/2014  . 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 Insomnia 09/07/2011     Prior to Admission medications   Medication Sig Start Date End Date Taking? Authorizing Provider  levothyroxine (SYNTHROID, LEVOTHROID) 50 MCG tablet TAKE ONE TABLET BY MOUTH ONCE DAILY BEFORE  BREAKFAST 07/23/15  Yes Dorian Heckle English, PA  Vitamin D, Ergocalciferol, (DRISDOL) 50000 UNITS CAPS capsule Take 1 capsule (50,000 Units total) by mouth every 7 (seven) days. Patient not taking: Reported on 10/31/2015 12/03/14   Philemon Kingdom, MD     No Known Allergies     Objective:  Physical Exam  Constitutional: She is oriented to person, place, and time. She appears well-developed and well-nourished. She is active and cooperative. No distress.  BP 118/70 mmHg  Pulse 92  Temp(Src) 98.1 F (36.7 C) (Oral)  Resp 18  Ht 5\' 3"  (1.6 m)  Wt 161 lb 3.2 oz (73.12 kg)  BMI 28.56 kg/m2  SpO2 96%  HENT:  Head: Normocephalic and atraumatic.  Right Ear: Hearing normal.  Left Ear: Hearing normal.  Eyes: Conjunctivae are normal. No scleral icterus.  Neck: Normal range of motion. Neck supple. No thyromegaly present.    Cardiovascular: Normal rate, regular rhythm and normal heart sounds.    Pulses:      Radial pulses are 2+ on the right side, and 2+ on the left side.  Pulmonary/Chest: Effort normal and breath sounds normal.  Musculoskeletal:       Cervical back: She exhibits tenderness, pain and spasm. She exhibits normal range of motion, no bony tenderness, no swelling, no edema, no deformity, no laceration and normal pulse.  Lymphadenopathy:       Head (right side): No tonsillar, no preauricular, no posterior auricular and no occipital adenopathy present.       Head (left side): No tonsillar, no preauricular, no posterior auricular and no occipital adenopathy present.    She has no cervical adenopathy.       Right: No supraclavicular adenopathy present.       Left: No supraclavicular adenopathy present.  Neurological: She is alert and oriented to person, place, and time. No sensory deficit.  Skin: Skin is warm, dry and intact. No rash noted. No cyanosis or erythema. Nails show no clubbing.  Psychiatric: She has a normal mood and affect. Her speech is normal and behavior is normal.           Assessment & Plan:   1. Hypothyroidism, unspecified hypothyroidism type Has been stable. Continue current treatment. - TSH - levothyroxine (SYNTHROID, LEVOTHROID) 50 MCG tablet; TAKE ONE TABLET BY MOUTH ONCE DAILY BEFORE  BREAKFAST  Dispense: 90 tablet; Refill: 3  2. Vitamin D deficiency 3. Osteopenia Not taking supplementation. Update lab result. If remains  low, will recommend restart supplement. - VITAMIN D 25 Hydroxy (Vit-D Deficiency, Fractures)  4. Need for hepatitis C screening test - Hepatitis C antibody  5. Screening for hyperlipidemia - Lipid panel  6. Neck pain/Spasm Heat, ROM exercises. -Cyclobenzaprine and meloxicam    Return in about 6 months (around 05/01/2016) for re-evaluation and follow-up/establish with new primary care provider.   Fara Chute, PA-C Physician Assistant-Certified [facility]

## 2015-10-31 NOTE — Patient Instructions (Addendum)
Please return in 6 months for recheck of the thyroid, and to establish with a new provider.    IF you received an x-ray today, you will receive an invoice from [facility]. Please contact [facility] at [phone] with questions or concerns regarding your invoice.   IF you received labwork today, you will receive an invoice from [employer]. Please contact [facility] at [phone] with questions or concerns regarding your invoice.   Our billing staff will not be able to assist you with questions regarding bills from these companies.  You will be contacted with the lab results as soon as they are available. The fastest way to get your results is to activate your My Chart account. Instructions are located on the last page of this paperwork. If you have not heard from [place] regarding the results in 2 weeks, please contact this office.

## 2015-10-31 NOTE — Telephone Encounter (Signed)
Rx resent.

## 2015-10-31 NOTE — Addendum Note (Signed)
Addended by: Harrison Mons S on: 10/31/2015 11:10 AM   Modules accepted: Orders

## 2015-11-01 ENCOUNTER — Encounter: Payer: Self-pay | Admitting: Physician Assistant

## 2015-11-01 LAB — VITAMIN D 25 HYDROXY (VIT D DEFICIENCY, FRACTURES): Vit D, 25-Hydroxy: 16 ng/mL — ABNORMAL LOW (ref 30–100)

## 2015-11-01 MED ORDER — VITAMIN D (ERGOCALCIFEROL) 1.25 MG (50000 UNIT) PO CAPS: 50000.0000 [IU] | ORAL_CAPSULE | ORAL | Status: DC

## 2015-11-01 NOTE — Addendum Note (Signed)
Addended by: Fara Chute on: 11/01/2015 02:20 PM   Modules accepted: Orders

## 2015-12-05 ENCOUNTER — Ambulatory Visit: Payer: Medicare Other | Admitting: Internal Medicine

## 2016-05-23 ENCOUNTER — Encounter: Payer: Self-pay | Admitting: Family Medicine

## 2016-05-23 ENCOUNTER — Ambulatory Visit (INDEPENDENT_AMBULATORY_CARE_PROVIDER_SITE_OTHER): Payer: Medicare Other | Admitting: Family Medicine

## 2016-05-23 VITALS — BP 108/56 | HR 68 | Temp 97.7°F | Resp 18 | Wt 161.8 lb

## 2016-05-23 DIAGNOSIS — R05 Cough: Secondary | ICD-10-CM

## 2016-05-23 DIAGNOSIS — R058 Other specified cough: Secondary | ICD-10-CM

## 2016-05-23 DIAGNOSIS — J209 Acute bronchitis, unspecified: Secondary | ICD-10-CM | POA: Diagnosis not present

## 2016-05-23 MED ORDER — BENZONATATE 100 MG PO CAPS: 100.0000 mg | ORAL_CAPSULE | Freq: Three times a day (TID) | ORAL | 0 refills | Status: DC | PRN

## 2016-05-23 MED ORDER — AMOXICILLIN 875 MG PO TABS: 875.0000 mg | ORAL_TABLET | Freq: Two times a day (BID) | ORAL | 0 refills | Status: DC

## 2016-05-23 NOTE — Patient Instructions (Signed)
Drink plenty of fluids and get enough rest  Continue using the NyQuil  In the daytime use the benzonatate cough pills one or 2 pills every 6-8 hours, maximum 6 and 24 hours.  Take the amoxicillin 875 mg one twice daily at breakfast and supper  If you're getting worse with difficulty breathing or fevers or chest pains or generalized illness return for further diagnostic testing and treatment.  I still advised taking the flu shot  Return as necessary

## 2016-05-23 NOTE — Progress Notes (Signed)
Patient ID: Julie Pacheco, female    DOB: [birth year]  Age: 70 y.o. MRN: [id number]  Chief Complaint  Patient presents with  . URI    cough x 1 week    Subjective:   70 year old lady who comes in here with one-week history of a respiratory tract infection. It started as a sore throat, then went into her chest. She has been coughing a lot. She takes some NyQuil which helps her rest at night. She is now coughing up green, and realizes she may need some additional care. She does not smoke. She is not had fever. Her ears are okay. The throat now is not painful. She gets some anterior chest soreness from coughing. No GI complaints.  She is generally pretty healthy. She does not take the flu shots because she doesn't like to take anything foreign.  Current allergies, medications, problem list, past/family and social histories reviewed.  Objective:  BP (!) 108/56 (Cuff Size: Normal)   Pulse 68   Temp 97.7 F (36.5 C) (Oral)   Resp 18   Wt 161 lb 12.8 oz (73.4 kg)   SpO2 95%   BMI 28.66 kg/m   Pleasant lady, healthy appearing, alert and oriented. TMs are normal. Throat not erythematous or swollen. Neck supple without significant nodes. Chest is clear anteriorly and posteriorly on auscultation. Heart regular without murmur.  Assessment & Plan:   Assessment: 1. Post-viral cough syndrome   2. Acute bronchitis, unspecified organism       Plan: This is a postviral cough, with probably a low-grade bronchitis. She is not terribly ill with it, but it has been going on a weekend seems to be getting more purulent so we will go ahead and give her a round of treatment to see if we can get they're clearing up.  No orders of the defined types were placed in this encounter.   Meds ordered this encounter  Medications  . amoxicillin (AMOXIL) 875 MG tablet    Sig: Take 1 tablet (875 mg total) by mouth 2 (two) times daily.    Dispense:  14 tablet    Refill:  0  . benzonatate (TESSALON) 100 MG  capsule    Sig: Take 1-2 capsules (100-200 mg total) by mouth 3 (three) times daily as needed.    Dispense:  30 capsule    Refill:  0         Patient Instructions  Drink plenty of fluids and get enough rest  Continue using the NyQuil  In the daytime use the benzonatate cough pills one or 2 pills every 6-8 hours, maximum 6 and 24 hours.  Take the amoxicillin 875 mg one twice daily at breakfast and supper  If you're getting worse with difficulty breathing or fevers or chest pains or generalized illness return for further diagnostic testing and treatment.  I still advised taking the flu shot  Return as necessary    Return if symptoms worsen or fail to improve.   HOPPER,DAVID, MD 05/23/2016

## 2016-05-29 ENCOUNTER — Telehealth: Payer: Self-pay | Admitting: *Deleted

## 2016-05-29 NOTE — Telephone Encounter (Signed)
Patient called stating she was seen on 05/18/16 and she was given an antibiotic amoxicillin and she still has a cough pt is requesting another refill or z-pack. please advise [phone]

## 2016-05-30 NOTE — Telephone Encounter (Signed)
Pt reported that her cough is a little better since she was here, but she took her last dose of amox this morn and still is coughing up green mucous. No fever or SOB. Advised pt that she should RTC if she feels that she is not improving for re-eval. Provider will have to eval her before more Abxs given to listen to lungs, possible CXR if needed,etc. Pt stated that it is a little better and she hates to come back in. Advised her that if she continues to improve can try taking Mucinex and drinking plenty of water. Pt agreed and will RTC if worsens, develops fever, weakness, SOB.

## 2016-06-28 ENCOUNTER — Ambulatory Visit (INDEPENDENT_AMBULATORY_CARE_PROVIDER_SITE_OTHER): Payer: Medicare Other

## 2016-06-28 ENCOUNTER — Ambulatory Visit (INDEPENDENT_AMBULATORY_CARE_PROVIDER_SITE_OTHER): Payer: Medicare Other | Admitting: Emergency Medicine

## 2016-06-28 VITALS — BP 134/72 | HR 62 | Temp 98.3°F | Resp 16 | Ht 62.5 in | Wt 162.0 lb

## 2016-06-28 DIAGNOSIS — R05 Cough: Secondary | ICD-10-CM

## 2016-06-28 DIAGNOSIS — H1032 Unspecified acute conjunctivitis, left eye: Secondary | ICD-10-CM

## 2016-06-28 DIAGNOSIS — R059 Cough, unspecified: Secondary | ICD-10-CM

## 2016-06-28 DIAGNOSIS — J209 Acute bronchitis, unspecified: Secondary | ICD-10-CM | POA: Diagnosis not present

## 2016-06-28 MED ORDER — TOBRAMYCIN 0.3 % OP SOLN: 2.0000 [drp] | OPHTHALMIC | 0 refills | Status: AC

## 2016-06-28 MED ORDER — PROMETHAZINE-CODEINE 6.25-10 MG/5ML PO SYRP: 5.0000 mL | ORAL_SOLUTION | Freq: Every evening | ORAL | 0 refills | Status: AC | PRN

## 2016-06-28 MED ORDER — AZITHROMYCIN 250 MG PO TABS
ORAL_TABLET | ORAL | 0 refills | Status: DC
Start: 2016-06-28 — End: 2016-07-19

## 2016-06-28 NOTE — Progress Notes (Signed)
Julie Pacheco 71 y.o.   Chief Complaint  Patient presents with  . Cough    x 15month    HISTORY OF PRESENT ILLNESS: This is a 71 y.o. female complaining of productive cough for over a month; failed a round of Amoxil; coughing getting worse.  Cough  This is a chronic problem. The current episode started more than 1 month ago. The problem has been waxing and waning. The problem occurs every few minutes. The cough is productive of purulent sputum. Associated symptoms include chills and eye redness (left eye). Pertinent negatives include no chest pain, fever, headaches, hemoptysis, myalgias, rash, sore throat or shortness of breath. Nothing aggravates the symptoms. She has tried OTC cough suppressant (Amoxil) for the symptoms. The treatment provided no relief.     Prior to Admission medications   Medication Sig Start Date End Date Taking? Authorizing Provider  levothyroxine (SYNTHROID, LEVOTHROID) 50 MCG tablet TAKE ONE TABLET BY MOUTH ONCE DAILY BEFORE  BREAKFAST 10/31/15  Yes Chelle Jeffery, PA-C  cyclobenzaprine (FLEXERIL) 10 MG tablet Take 1 tablet (10 mg total) by mouth 3 (three) times daily as needed for muscle spasms. Patient not taking: Reported on 05/23/2016 10/31/15   Harrison Mons, PA-C  meloxicam (MOBIC) 15 MG tablet Take 1 tablet (15 mg total) by mouth daily. Patient not taking: Reported on 05/23/2016 10/31/15   Harrison Mons, PA-C  Vitamin D, Ergocalciferol, (DRISDOL) 50000 units CAPS capsule Take 1 capsule (50,000 Units total) by mouth every 7 (seven) days. Patient not taking: Reported on 05/23/2016 11/01/15   Harrison Mons, PA-C    No Known Allergies  Patient Active Problem List   Diagnosis Date Noted  . Hypothyroidism 10/31/2015  . Vitamin D deficiency 10/31/2015  . BMI 28.0-28.9,adult 10/31/2015  . Osteopenia 12/03/2014  . 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 Insomnia 09/07/2011    Past Medical History:  Diagnosis Date  . Migraines   . Thyroid disease     No past  surgical history on file.  Social History   Social History  . Marital status: Married    Spouse name: N/A  . Number of children: N/A  . Years of education: N/A   Occupational History  . Not on file.   Social History Main Topics  . Smoking status: Former Smoker    Quit date: 12/15/1981  . Smokeless tobacco: Never Used  . Alcohol use Not on file  . Drug use: Unknown  . Sexual activity: Not on file   Other Topics Concern  . Not on file   Social History Narrative  . No narrative on file    No family history on file.   Review of Systems  Constitutional: Positive for chills. Negative for fever.  HENT: Positive for congestion. Negative for nosebleeds, sinus pain and sore throat.   Eyes: Positive for discharge and redness (left eye).  Respiratory: Positive for cough. Negative for hemoptysis and shortness of breath.   Cardiovascular: Negative for chest pain.  Gastrointestinal: Negative for abdominal pain, diarrhea, nausea and vomiting.  Genitourinary: Negative for dysuria and hematuria.  Musculoskeletal: Negative for myalgias.  Skin: Negative for rash.  Neurological: Negative for dizziness and headaches.  Endo/Heme/Allergies: Does not bruise/bleed easily.  All other systems reviewed and are negative.  Vitals:   06/28/16 0944 06/28/16 0953  BP: 140/66 134/72  Pulse: 62   Resp: 16   Temp: 98.3 F (36.8 C)     Physical Exam  Constitutional: She is oriented to person, place, and time. She appears well-developed and  well-nourished.  HENT:  Head: Normocephalic and atraumatic.  Nose: Nose normal.  Mouth/Throat: Oropharynx is clear and moist. No oropharyngeal exudate.  Eyes: EOM are normal. Pupils are equal, round, and reactive to light. Left conjunctiva is injected.  Neck: Normal range of motion. Neck supple. No JVD present. No thyromegaly present.  Cardiovascular: Normal rate, regular rhythm and normal heart sounds.   Pulmonary/Chest: Effort normal and breath sounds  normal.  Abdominal: Soft. She exhibits no distension. There is no tenderness.  Musculoskeletal: Normal range of motion.  Lymphadenopathy:    She has no cervical adenopathy.  Neurological: She is alert and oriented to person, place, and time. No sensory deficit. She exhibits normal muscle tone.  Skin: Skin is warm and dry. Capillary refill takes less than 2 seconds.  Psychiatric: She has a normal mood and affect. Her behavior is normal.  Vitals reviewed.  CXR reviewed: NAD  ASSESSMENT & PLAN: Julie Pacheco was seen today for cough.  Diagnoses and all orders for this visit:  Acute bronchitis, unspecified organism  Cough -     DG Chest 2 View; Future  Acute conjunctivitis of left eye, unspecified acute conjunctivitis type  Other orders -     azithromycin (ZITHROMAX) 250 MG tablet; Sig as indicated -     tobramycin (TOBREX) 0.3 % ophthalmic solution; Place 2 drops into the left eye every 4 (four) hours. -     promethazine-codeine (PHENERGAN WITH CODEINE) 6.25-10 MG/5ML syrup; Take 5 mLs by mouth at bedtime as needed for cough.    Patient Instructions       IF you received an x-ray today, you will receive an invoice from [facility]. Please contact [facility] at [phone] with questions or concerns regarding your invoice.   IF you received labwork today, you will receive an invoice from [place]. Please contact [facility] at [phone] with questions or concerns regarding your invoice.   Our billing staff will not be able to assist you with questions regarding bills from these companies.  You will be contacted with the lab results as soon as they are available. The fastest way to get your results is to activate your My Chart account. Instructions are located on the last page of this paperwork. If you have not heard from [place] regarding the results in 2 weeks, please contact this office.     Acute Bronchitis, Adult Acute bronchitis is when air tubes (bronchi) in  the lungs suddenly get swollen. The condition can make it hard to breathe. It can also cause these symptoms:  A cough.  Coughing up clear, yellow, or green mucus.  Wheezing.  Chest congestion.  Shortness of breath.  A fever.  Body aches.  Chills.  A sore throat. Follow these instructions at home: Medicines  Take over-the-counter and prescription medicines only as told by your doctor.  If you were prescribed an antibiotic medicine, take it as told by your doctor. Do not stop taking the antibiotic even if you start to feel better. General instructions  Rest.  Drink enough fluids to keep your pee (urine) clear or pale yellow.  Avoid smoking and secondhand smoke. If you smoke and you need help quitting, ask your doctor. Quitting will help your lungs heal faster.  Use an inhaler, cool mist vaporizer, or humidifier as told by your doctor.  Keep all follow-up visits as told by your doctor. This is important. How is this prevented? To lower your risk of getting this condition again:  Wash your hands often with soap  and water. If you cannot use soap and water, use hand sanitizer.  Avoid contact with people who have cold symptoms.  Try not to touch your hands to your mouth, nose, or eyes.  Make sure to get the flu shot every year. Contact a doctor if:  Your symptoms do not get better in 2 weeks. Get help right away if:  You cough up blood.  You have chest pain.  You have very bad shortness of breath.  You become dehydrated.  You faint (pass out) or keep feeling like you are going to pass out.  You keep throwing up (vomiting).  You have a very bad headache.  Your fever or chills gets worse. This information is not intended to replace advice given to you by your health care provider. Make sure you discuss any questions you have with your health care provider. Document Released: 10/31/2007 Document Revised: 12/21/2015 Document Reviewed: 11/02/2015 Elsevier  Interactive Patient Education  2017 Elsevier Inc.      Agustina Caroli, MD [facility]

## 2016-06-28 NOTE — Patient Instructions (Addendum)
     IF you received an x-ray today, you will receive an invoice from [facility]. Please contact [facility] at [phone] with questions or concerns regarding your invoice.   IF you received labwork today, you will receive an invoice from [facility]. Please contact [facility] at [phone] with questions or concerns regarding your invoice.   Our billing staff will not be able to assist you with questions regarding bills from these companies.  You will be contacted with the lab results as soon as they are available. The fastest way to get your results is to activate your My Chart account. Instructions are located on the last page of this paperwork. If you have not heard from us regarding the results in 2 weeks, please contact this office.      Acute Bronchitis, Adult Acute bronchitis is when air tubes (bronchi) in the lungs suddenly get swollen. The condition can make it hard to breathe. It can also cause these symptoms:  A cough.  Coughing up clear, yellow, or green mucus.  Wheezing.  Chest congestion.  Shortness of breath.  A fever.  Body aches.  Chills.  A sore throat.  Follow these instructions at home: Medicines  Take over-the-counter and prescription medicines only as told by your doctor.  If you were prescribed an antibiotic medicine, take it as told by your doctor. Do not stop taking the antibiotic even if you start to feel better. General instructions  Rest.  Drink enough fluids to keep your pee (urine) clear or pale yellow.  Avoid smoking and secondhand smoke. If you smoke and you need help quitting, ask your doctor. Quitting will help your lungs heal faster.  Use an inhaler, cool mist vaporizer, or humidifier as told by your doctor.  Keep all follow-up visits as told by your doctor. This is important. How is this prevented? To lower your risk of getting this condition again:  Wash your hands often with soap and water. If you cannot  use soap and water, use hand sanitizer.  Avoid contact with people who have cold symptoms.  Try not to touch your hands to your mouth, nose, or eyes.  Make sure to get the flu shot every year.  Contact a doctor if:  Your symptoms do not get better in 2 weeks. Get help right away if:  You cough up blood.  You have chest pain.  You have very bad shortness of breath.  You become dehydrated.  You faint (pass out) or keep feeling like you are going to pass out.  You keep throwing up (vomiting).  You have a very bad headache.  Your fever or chills gets worse. This information is not intended to replace advice given to you by your health care provider. Make sure you discuss any questions you have with your health care provider. Document Released: 10/31/2007 Document Revised: 12/21/2015 Document Reviewed: 11/02/2015 Elsevier Interactive Patient Education  2017 Elsevier Inc.  

## 2016-07-13 ENCOUNTER — Telehealth: Payer: Self-pay

## 2016-07-13 NOTE — Telephone Encounter (Signed)
Pt believes that she now has strep and would like something called in  And patient is going out of town this weekend [employer] number [phone]

## 2016-07-14 NOTE — Telephone Encounter (Signed)
Seen for viral symptoms 2 weeks ago and never really resolved. Now has severe sore throat with "pus pockets" I advised re eval for proper treatment, could be part of same virus. Out of town now and will garlgle with salt water now. If not better by Monday will come see [place]. Denies fever

## 2016-07-19 ENCOUNTER — Ambulatory Visit: Payer: Medicare Other | Admitting: Urgent Care

## 2016-07-19 ENCOUNTER — Ambulatory Visit (INDEPENDENT_AMBULATORY_CARE_PROVIDER_SITE_OTHER): Payer: Medicare Other | Admitting: Emergency Medicine

## 2016-07-19 VITALS — BP 126/60 | HR 59 | Temp 98.1°F | Resp 18 | Ht 62.5 in | Wt 164.8 lb

## 2016-07-19 DIAGNOSIS — J029 Acute pharyngitis, unspecified: Secondary | ICD-10-CM

## 2016-07-19 MED ORDER — AZITHROMYCIN 250 MG PO TABS: ORAL_TABLET | ORAL | 0 refills | Status: DC

## 2016-07-19 MED ORDER — LEVOTHYROXINE SODIUM 50 MCG PO TABS: ORAL_TABLET | ORAL | 3 refills | Status: DC

## 2016-07-19 NOTE — Progress Notes (Signed)
Julie Pacheco 71 y.o.   Chief Complaint  Patient presents with  . Sore Throat    HISTORY OF PRESENT ILLNESS: This is a 71 y.o. female complaining of sore throat; seen by me earlier this month for bronchitis; did well on Z-pak and cough medicine. Husband noticed white patches last night. No other significant symptoms.  Sore Throat   This is a new problem. The current episode started in the past 7 days. The problem has been waxing and waning. There has been no fever. The pain is mild. Pertinent negatives include no congestion, coughing, diarrhea, ear pain, headaches, neck pain, shortness of breath, stridor, swollen glands, trouble swallowing or vomiting.     Prior to Admission medications   Medication Sig Start Date End Date Taking? Authorizing Provider  levothyroxine (SYNTHROID, LEVOTHROID) 50 MCG tablet TAKE ONE TABLET BY MOUTH ONCE DAILY BEFORE  BREAKFAST 07/19/16  Yes [facility], MD  azithromycin [facility]) 250 MG tablet Sig as indicated 07/19/16   Horald Pollen, MD    No Known Allergies  Patient Active Problem List   Diagnosis Date Noted  . Acute bronchitis 06/28/2016  . Hypothyroidism 10/31/2015  . Vitamin D deficiency 10/31/2015  . BMI 28.0-28.9,adult 10/31/2015  . Osteopenia 12/03/2014  . 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 Insomnia 09/07/2011    Past Medical History:  Diagnosis Date  . Migraines   . Thyroid disease     History reviewed. No pertinent surgical history.  Social History   Social History  . Marital status: Married    Spouse name: N/A  . Number of children: N/A  . Years of education: N/A   Occupational History  . Not on file.   Social History Main Topics  . Smoking status: Former Smoker    Quit date: 12/15/1981  . Smokeless tobacco: Never Used  . Alcohol use Not on file  . Drug use: Unknown  . Sexual activity: Not on file   Other Topics Concern  . Not on file   Social History Narrative  . No narrative on file     History reviewed. No pertinent family history.   Review of Systems  Constitutional: Negative for chills and fever.  HENT: Positive for sore throat. Negative for congestion, ear pain, nosebleeds and trouble swallowing.   Eyes: Negative for discharge and redness.  Respiratory: Negative for cough, shortness of breath and stridor.   Cardiovascular: Negative for chest pain and palpitations.  Gastrointestinal: Negative for diarrhea and vomiting.  Genitourinary: Negative for dysuria and hematuria.  Musculoskeletal: Negative for myalgias and neck pain.  Skin: Negative for rash.  Neurological: Negative for dizziness and headaches.  All other systems reviewed and are negative.  Vitals:   07/19/16 1054  BP: 126/60  Pulse: (!) 59  Resp: 18  Temp: 98.1 F (36.7 C)     Physical Exam  Constitutional: She is oriented to person, place, and time. She appears well-developed and well-nourished.  HENT:  Head: Normocephalic and atraumatic.  Nose: Nose normal.  Mouth/Throat: Posterior oropharyngeal erythema (mild) present. No oropharyngeal exudate.  Eyes: Conjunctivae and EOM are normal. Pupils are equal, round, and reactive to light.  Neck: Normal range of motion. Neck supple.  Cardiovascular: Normal rate and regular rhythm.   Pulmonary/Chest: Effort normal and breath sounds normal.  Musculoskeletal: Normal range of motion.  Lymphadenopathy:    She has no cervical adenopathy.  Neurological: She is alert and oriented to person, place, and time. No sensory deficit. She exhibits normal muscle tone.  Skin:  Skin is warm and dry. Capillary refill takes less than 2 seconds.  Psychiatric: She has a normal mood and affect. Her behavior is normal.  Vitals reviewed.    ASSESSMENT & PLAN: Advised to monitor symptoms; symptomatic treatment; prescribed Z-pak but advised to not start it unless symptoms worsen in the next 2-3 days while out of town. [name] was seen today for sore  throat.  Diagnoses and all orders for this visit:  Acute pharyngitis, unspecified etiology  Other orders -     levothyroxine (SYNTHROID, LEVOTHROID) 50 MCG tablet; TAKE ONE TABLET BY MOUTH ONCE DAILY BEFORE  BREAKFAST -     azithromycin (ZITHROMAX) 250 MG tablet; Sig as indicated    Patient Instructions   Z-pak prescribed. Do not start it unless symptoms worsen in the next 2-3 days.    IF you received an x-ray today, you will receive an invoice from [facility]. Please contact [facility] at [phone] with questions or concerns regarding your invoice.   IF you received labwork today, you will receive an invoice from [place]. Please contact [facility] at [phone] with questions or concerns regarding your invoice.   Our billing staff will not be able to assist you with questions regarding bills from these companies.  You will be contacted with the lab results as soon as they are available. The fastest way to get your results is to activate your My Chart account. Instructions are located on the last page of this paperwork. If you have not heard from [place] regarding the results in 2 weeks, please contact this office.      Pharyngitis Pharyngitis is a sore throat (pharynx). There is redness, pain, and swelling of your throat. Follow these instructions at home:  Drink enough fluids to keep your pee (urine) clear or pale yellow.  Only take medicine as told by your doctor.  You may get sick again if you do not take medicine as told. Finish your medicines, even if you start to feel better.  Do not take aspirin.  Rest.  Rinse your mouth (gargle) with salt water ( tsp of salt per 1 qt of water) every 1-2 hours. This will help the pain.  If you are not at risk for choking, you can suck on hard candy or sore throat lozenges. Contact a doctor if:  You have large, tender lumps on your neck.  You have a rash.  You cough up green, yellow-brown, or bloody  spit. Get help right away if:  You have a stiff neck.  You drool or cannot swallow liquids.  You throw up (vomit) or are not able to keep medicine or liquids down.  You have very bad pain that does not go away with medicine.  You have problems breathing (not from a stuffy nose). This information is not intended to replace advice given to you by your health care provider. Make sure you discuss any questions you have with your health care provider. Document Released: 10/31/2007 Document Revised: 10/20/2015 Document Reviewed: 01/19/2013 Elsevier Interactive Patient Education  2017 Elsevier Inc.      Agustina Caroli, MD [facility]

## 2016-07-19 NOTE — Patient Instructions (Addendum)
Z-pak prescribed. Do not start it unless symptoms worsen in the next 2-3 days.    IF you received an x-ray today, you will receive an invoice from [facility]. Please contact [facility] at [phone] with questions or concerns regarding your invoice.   IF you received labwork today, you will receive an invoice from [place]. Please contact [facility] at [phone] with questions or concerns regarding your invoice.   Our billing staff will not be able to assist you with questions regarding bills from these companies.  You will be contacted with the lab results as soon as they are available. The fastest way to get your results is to activate your My Chart account. Instructions are located on the last page of this paperwork. If you have not heard from [place] regarding the results in 2 weeks, please contact this office.      Pharyngitis Pharyngitis is a sore throat (pharynx). There is redness, pain, and swelling of your throat. Follow these instructions at home:  Drink enough fluids to keep your pee (urine) clear or pale yellow.  Only take medicine as told by your doctor.  You may get sick again if you do not take medicine as told. Finish your medicines, even if you start to feel better.  Do not take aspirin.  Rest.  Rinse your mouth (gargle) with salt water ( tsp of salt per 1 qt of water) every 1-2 hours. This will help the pain.  If you are not at risk for choking, you can suck on hard candy or sore throat lozenges. Contact a doctor if:  You have large, tender lumps on your neck.  You have a rash.  You cough up green, yellow-brown, or bloody spit. Get help right away if:  You have a stiff neck.  You drool or cannot swallow liquids.  You throw up (vomit) or are not able to keep medicine or liquids down.  You have very bad pain that does not go away with medicine.  You have problems breathing (not from a stuffy nose). This information is not intended  to replace advice given to you by your health care provider. Make sure you discuss any questions you have with your health care provider. Document Released: 10/31/2007 Document Revised: 10/20/2015 Document Reviewed: 01/19/2013 Elsevier Interactive Patient Education  2017 [employer].

## 2016-08-13 ENCOUNTER — Other Ambulatory Visit: Payer: Self-pay | Admitting: Emergency Medicine

## 2016-08-13 DIAGNOSIS — Z1231 Encounter for screening mammogram for malignant neoplasm of breast: Secondary | ICD-10-CM

## 2016-09-03 ENCOUNTER — Ambulatory Visit
Admission: RE | Admit: 2016-09-03 | Discharge: 2016-09-03 | Disposition: A | Discharge status: Home / Self Care | Payer: Medicare Other | Source: Ambulatory Visit | Attending: Emergency Medicine | Admitting: Emergency Medicine

## 2016-09-03 DIAGNOSIS — Z1231 Encounter for screening mammogram for malignant neoplasm of breast: Secondary | ICD-10-CM | POA: Diagnosis not present

## 2016-09-05 ENCOUNTER — Encounter: Payer: Self-pay | Admitting: *Deleted

## 2016-09-12 ENCOUNTER — Ambulatory Visit (INDEPENDENT_AMBULATORY_CARE_PROVIDER_SITE_OTHER): Payer: Medicare Other | Admitting: Emergency Medicine

## 2016-09-12 VITALS — BP 133/75 | HR 58 | Temp 97.9°F | Resp 16 | Ht 62.5 in | Wt 163.0 lb

## 2016-09-12 DIAGNOSIS — R030 Elevated blood-pressure reading, without diagnosis of hypertension: Secondary | ICD-10-CM

## 2016-09-12 DIAGNOSIS — Z0000 Encounter for general adult medical examination without abnormal findings: Secondary | ICD-10-CM

## 2016-09-12 DIAGNOSIS — Z13220 Encounter for screening for lipoid disorders: Secondary | ICD-10-CM | POA: Insufficient documentation

## 2016-09-12 LAB — CBC WITH DIFFERENTIAL/PLATELET
BASOS ABS: 0 10*3/uL (ref 0.0–0.2)
Basos: 1 %
EOS (ABSOLUTE): 0.1 10*3/uL (ref 0.0–0.4)
Eos: 1 %
HEMOGLOBIN: 14.4 g/dL (ref 11.1–15.9)
Hematocrit: 42.2 % (ref 34.0–46.6)
IMMATURE GRANS (ABS): 0 10*3/uL (ref 0.0–0.1)
Immature Granulocytes: 0 %
LYMPHS ABS: 2.2 10*3/uL (ref 0.7–3.1)
LYMPHS: 35 %
MCH: 31.3 pg (ref 26.6–33.0)
MCHC: 34.1 g/dL (ref 31.5–35.7)
MCV: 92 fL (ref 79–97)
MONOCYTES: 7 %
Monocytes Absolute: 0.4 10*3/uL (ref 0.1–0.9)
NEUTROS PCT: 56 %
Neutrophils Absolute: 3.5 10*3/uL (ref 1.4–7.0)
Platelets: 216 10*3/uL (ref 150–379)
RBC: 4.6 x10E6/uL (ref 3.77–5.28)
RDW: 13.2 % (ref 12.3–15.4)
WBC: 6.2 10*3/uL (ref 3.4–10.8)

## 2016-09-12 LAB — POCT URINALYSIS DIP (MANUAL ENTRY)
Bilirubin, UA: NEGATIVE
GLUCOSE UA: NEGATIVE mg/dL
Ketones, POC UA: NEGATIVE mg/dL
Leukocytes, UA: NEGATIVE
Nitrite, UA: NEGATIVE
Protein Ur, POC: NEGATIVE mg/dL
SPEC GRAV UA: 1.02 (ref 1.010–1.025)
UROBILINOGEN UA: 0.2 U/dL
pH, UA: 5.5 (ref 5.0–8.0)

## 2016-09-12 LAB — COMPREHENSIVE METABOLIC PANEL
ALK PHOS: 68 IU/L (ref 39–117)
ALT: 27 IU/L (ref 0–32)
AST: 23 IU/L (ref 0–40)
Albumin/Globulin Ratio: 1.4 (ref 1.2–2.2)
Albumin: 4.2 g/dL (ref 3.5–4.8)
BUN/Creatinine Ratio: 16 (ref 12–28)
BUN: 11 mg/dL (ref 8–27)
Bilirubin Total: 0.4 mg/dL (ref 0.0–1.2)
CHLORIDE: 105 mmol/L (ref 96–106)
CO2: 23 mmol/L (ref 18–29)
Calcium: 9.3 mg/dL (ref 8.7–10.3)
Creatinine, Ser: 0.69 mg/dL (ref 0.57–1.00)
GFR calc Af Amer: 101 mL/min/{1.73_m2} (ref >59)
GFR calc non Af Amer: 87 mL/min/{1.73_m2} (ref >59)
Globulin, Total: 3 g/dL (ref 1.5–4.5)
Glucose: 96 mg/dL (ref 65–99)
Potassium: 4.9 mmol/L (ref 3.5–5.2)
Sodium: 143 mmol/L (ref 134–144)
Total Protein: 7.2 g/dL (ref 6.0–8.5)

## 2016-09-12 NOTE — Progress Notes (Signed)
Julie Pacheco 71 y.o.   Chief Complaint  Patient presents with  . Hypertension    PT STATES SHE HAD A TUMOR BEFORE WHICH CAUSES HYPERTENSION    HISTORY OF PRESENT ILLNESS: This is a 71 y.o. female concerned about HTN; felt dizzy/lightheaded last Sunday after getting up too fast; went to pharmacy to have BP checked: systoloc 160'F and diastolic 09'N; at home systolic 235'T diastolic 73'U. Otherwise asymptomatic.  HPI   Prior to Admission medications   Medication Sig Start Date End Date Taking? Authorizing Provider  levothyroxine (SYNTHROID, LEVOTHROID) 50 MCG tablet TAKE ONE TABLET BY MOUTH ONCE DAILY BEFORE  BREAKFAST 07/19/16  Yes Horald Pollen, MD    No Known Allergies  Patient Active Problem List   Diagnosis Date Noted  . Transient hypertension 09/12/2016  . Lipid screening 09/12/2016  . Elevated blood-pressure reading, without diagnosis of hypertension 09/12/2016  . Screening for cholesterol level 09/12/2016  . Acute bronchitis 06/28/2016  . Hypothyroidism 10/31/2015  . Vitamin D deficiency 10/31/2015  . BMI 28.0-28.9,adult 10/31/2015  . Osteopenia 12/03/2014  . 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 Insomnia 09/07/2011    Past Medical History:  Diagnosis Date  . Migraines   . Thyroid disease     No past surgical history on file.  Social History   Social History  . Marital status: Married    Spouse name: N/A  . Number of children: N/A  . Years of education: N/A   Occupational History  . Not on file.   Social History Main Topics  . Smoking status: Former Smoker    Quit date: 12/15/1981  . Smokeless tobacco: Never Used  . Alcohol use Not on file  . Drug use: Unknown  . Sexual activity: Not on file   Other Topics Concern  . Not on file   Social History Narrative  . No narrative on file    Family History  Problem Relation Age of Onset  . Breast cancer Maternal Aunt      Review of Systems  Constitutional: Negative.  Negative for chills,  fever and malaise/fatigue.  HENT: Negative.  Negative for ear pain, hearing loss, nosebleeds, sinus pain and sore throat.   Eyes: Negative.  Negative for blurred vision, double vision, discharge and redness.  Respiratory: Negative.  Negative for cough, shortness of breath and wheezing.   Cardiovascular: Negative.  Negative for chest pain, palpitations and leg swelling.  Gastrointestinal: Negative.  Negative for abdominal pain, diarrhea, nausea and vomiting.  Genitourinary: Negative.  Negative for dysuria and hematuria.  Musculoskeletal: Negative.  Negative for back pain, myalgias and neck pain.  Skin: Negative.  Negative for rash.  Neurological: Negative.  Negative for dizziness, sensory change, focal weakness, weakness and headaches.  Endo/Heme/Allergies: Negative.   All other systems reviewed and are negative.  Vitals:   09/12/16 0814  BP: 133/75  Pulse: (!) 58  Resp: 16  Temp: 97.9 F (36.6 C)     Physical Exam  Constitutional: She is oriented to person, place, and time. She appears well-developed and well-nourished.  HENT:  Head: Normocephalic and atraumatic.  Nose: Nose normal.  Mouth/Throat: Oropharynx is clear and moist. No oropharyngeal exudate.  Eyes: Conjunctivae and EOM are normal. Pupils are equal, round, and reactive to light.  Neck: Normal range of motion. Neck supple. No JVD present. No thyromegaly present.  Cardiovascular: Normal rate, regular rhythm, normal heart sounds and intact distal pulses.   Pulmonary/Chest: Effort normal and breath sounds normal.  Abdominal: Soft. Bowel sounds are  normal. She exhibits no distension. There is no tenderness.  Musculoskeletal: Normal range of motion. She exhibits no edema or tenderness.  Lymphadenopathy:    She has no cervical adenopathy.  Neurological: She is alert and oriented to person, place, and time. She displays normal reflexes. No sensory deficit. She exhibits normal muscle tone.  Skin: Skin is warm and dry.  Capillary refill takes less than 2 seconds. No rash noted.  Psychiatric: She has a normal mood and affect. Her behavior is normal.  Vitals reviewed.    ASSESSMENT & PLAN: Julie Pacheco was seen today for hypertension.  Diagnoses and all orders for this visit:  Transient hypertension -     Comprehensive metabolic panel -     CBC with Differential/Platelet -     POCT urinalysis dipstick  Elevated blood-pressure reading, without diagnosis of hypertension  Health maintenance examination  Lipid screening -     Lipid panel  Other orders -     Cancel: Lipid panel -     Cancel: TSH -     Cancel: Hemoglobin A1c    Patient Instructions       IF you received an x-ray today, you will receive an invoice from [facility]. Please contact [facility] at [phone] with questions or concerns regarding your invoice.   IF you received labwork today, you will receive an invoice from [place]. Please contact [facility] at [phone] with questions or concerns regarding your invoice.   Our billing staff will not be able to assist you with questions regarding bills from these companies.  You will be contacted with the lab results as soon as they are available. The fastest way to get your results is to activate your My Chart account. Instructions are located on the last page of this paperwork. If you have not heard from [place] regarding the results in 2 weeks, please contact this office.     How to Take Your Blood Pressure You can take your blood pressure at home with a machine. You may need to check your blood pressure at home:  To check if you have high blood pressure (hypertension).  To check your blood pressure over time.  To make sure your blood pressure medicine is working. Supplies needed: You will need a blood pressure machine, or monitor. You can buy one at a drugstore or online. When choosing one:  Choose one with an arm cuff.  Choose one that wraps around your  upper arm. Only one finger should fit between your arm and the cuff.  Do not choose one that measures your blood pressure from your wrist or finger. Your doctor can suggest a monitor. How to prepare Avoid these things for 30 minutes before checking your blood pressure:  Drinking caffeine.  Drinking alcohol.  Eating.  Smoking.  Exercising. Five minutes before checking your blood pressure:  Pee.  Sit in a dining chair. Avoid sitting in a soft couch or armchair.  Be quiet. Do not talk. How to take your blood pressure Follow the instructions that came with your machine. If you have a digital blood pressure monitor, these may be the instructions: 1. Sit up straight. 2. Place your feet on the floor. Do not cross your ankles or legs. 3. Rest your left arm at the level of your heart. You may rest it on a table, desk, or chair. 4. Pull up your shirt sleeve. 5. Wrap the blood pressure cuff around the upper part of your left arm. The cuff should be 1  inch (2.5 cm) above your elbow. It is best to wrap the cuff around bare skin. 6. Fit the cuff snugly around your arm. You should be able to place only one finger between the cuff and your arm. 7. Put the cord inside the groove of your elbow. 8. Press the power button. 9. Sit quietly while the cuff fills with air and loses air. 10. Write down the numbers on the screen. 11. Wait 2-3 minutes and then repeat steps 1-10. What do the numbers mean? Two numbers make up your blood pressure. The first number is called systolic pressure. The second is called diastolic pressure. An example of a blood pressure reading is "120 over 80" (or 120/80). If you are an adult and do not have a medical condition, use this guide to find out if your blood pressure is normal: Normal   First number: below 120.  Second number: below 80. Elevated   First number: 120-129.  Second number: below 80. Hypertension stage 1   First number: 130-139.  Second number:  80-89. Hypertension stage 2   First number: 140 or above.  Second number: 31 or above. Your blood pressure is above normal even if only the top or bottom number is above normal. Follow these instructions at home:  Check your blood pressure as often as your doctor tells you to.  Take your monitor to your next doctor's appointment. Your doctor will:  Make sure you are using it correctly.  Make sure it is working right.  Make sure you understand what your blood pressure numbers should be.  Tell your doctor if your medicines are causing side effects. Contact a doctor if:  Your blood pressure keeps being high. Get help right away if:  Your first blood pressure number is higher than 180.  Your second blood pressure number is higher than 120. This information is not intended to replace advice given to you by your health care provider. Make sure you discuss any questions you have with your health care provider. Document Released: 04/26/2008 Document Revised: 04/11/2016 Document Reviewed: 10/21/2015 Elsevier Interactive Patient Education  2017 Elsevier Inc.      Agustina Caroli, MD [facility]

## 2016-09-12 NOTE — Patient Instructions (Addendum)
IF you received an x-ray today, you will receive an invoice from [facility]. Please contact [facility] at [phone] with questions or concerns regarding your invoice.   IF you received labwork today, you will receive an invoice from [place]. Please contact [facility] at [phone] with questions or concerns regarding your invoice.   Our billing staff will not be able to assist you with questions regarding bills from these companies.  You will be contacted with the lab results as soon as they are available. The fastest way to get your results is to activate your My Chart account. Instructions are located on the last page of this paperwork. If you have not heard from [place] regarding the results in 2 weeks, please contact this office.     How to Take Your Blood Pressure You can take your blood pressure at home with a machine. You may need to check your blood pressure at home:  To check if you have high blood pressure (hypertension).  To check your blood pressure over time.  To make sure your blood pressure medicine is working. Supplies needed: You will need a blood pressure machine, or monitor. You can buy one at a drugstore or online. When choosing one:  Choose one with an arm cuff.  Choose one that wraps around your upper arm. Only one finger should fit between your arm and the cuff.  Do not choose one that measures your blood pressure from your wrist or finger. Your doctor can suggest a monitor. How to prepare Avoid these things for 30 minutes before checking your blood pressure:  Drinking caffeine.  Drinking alcohol.  Eating.  Smoking.  Exercising. Five minutes before checking your blood pressure:  Pee.  Sit in a dining chair. Avoid sitting in a soft couch or armchair.  Be quiet. Do not talk. How to take your blood pressure Follow the instructions that came with your machine. If you have a digital blood pressure monitor, these may be the  instructions: 1. Sit up straight. 2. Place your feet on the floor. Do not cross your ankles or legs. 3. Rest your left arm at the level of your heart. You may rest it on a table, desk, or chair. 4. Pull up your shirt sleeve. 5. Wrap the blood pressure cuff around the upper part of your left arm. The cuff should be 1 inch (2.5 cm) above your elbow. It is best to wrap the cuff around bare skin. 6. Fit the cuff snugly around your arm. You should be able to place only one finger between the cuff and your arm. 7. Put the cord inside the groove of your elbow. 8. Press the power button. 9. Sit quietly while the cuff fills with air and loses air. 10. Write down the numbers on the screen. 11. Wait 2-3 minutes and then repeat steps 1-10. What do the numbers mean? Two numbers make up your blood pressure. The first number is called systolic pressure. The second is called diastolic pressure. An example of a blood pressure reading is "120 over 80" (or 120/80). If you are an adult and do not have a medical condition, use this guide to find out if your blood pressure is normal: Normal   First number: below 120.  Second number: below 80. Elevated   First number: 120-129.  Second number: below 80. Hypertension stage 1   First number: 130-139.  Second number: 80-89. Hypertension stage 2   First number: 140 or above.  Second  number: 90 or above. Your blood pressure is above normal even if only the top or bottom number is above normal. Follow these instructions at home:  Check your blood pressure as often as your doctor tells you to.  Take your monitor to your next doctor's appointment. Your doctor will:  Make sure you are using it correctly.  Make sure it is working right.  Make sure you understand what your blood pressure numbers should be.  Tell your doctor if your medicines are causing side effects. Contact a doctor if:  Your blood pressure keeps being high. Get help right away  if:  Your first blood pressure number is higher than 180.  Your second blood pressure number is higher than 120. This information is not intended to replace advice given to you by your health care provider. Make sure you discuss any questions you have with your health care provider. Document Released: 04/26/2008 Document Revised: 04/11/2016 Document Reviewed: 10/21/2015 Elsevier Interactive Patient Education  2017 [employer].

## 2016-09-13 ENCOUNTER — Telehealth: Payer: Self-pay | Admitting: Emergency Medicine

## 2016-09-13 NOTE — Telephone Encounter (Signed)
Pt called to discuss her mammogram results.  She had received them by mail today and she has no idea what any of it means.  She also states that the results talked about her breast implants but the pt does not have implants.  I encouraged her to also contact her imaging center where she had the exam done.  Please advised.  Pt is very confused and concerned. [phone]

## 2016-09-14 NOTE — Telephone Encounter (Signed)
See details of result and advise ( impression normal but details)

## 2016-09-15 NOTE — Telephone Encounter (Signed)
Breast center advised, they will f/u with this patient on monday

## 2016-09-15 NOTE — Telephone Encounter (Signed)
If patient does not have implants, this is then not her report. She needs to contact imaging center/radiology group or maybe we should to clarify.

## 2016-09-19 DIAGNOSIS — L570 Actinic keratosis: Secondary | ICD-10-CM | POA: Diagnosis not present

## 2016-09-19 DIAGNOSIS — L821 Other seborrheic keratosis: Secondary | ICD-10-CM | POA: Diagnosis not present

## 2016-09-19 DIAGNOSIS — D229 Melanocytic nevi, unspecified: Secondary | ICD-10-CM | POA: Diagnosis not present

## 2016-10-03 DIAGNOSIS — M545 Low back pain: Secondary | ICD-10-CM | POA: Diagnosis not present

## 2016-10-17 ENCOUNTER — Encounter: Payer: Self-pay | Admitting: Emergency Medicine

## 2016-10-17 ENCOUNTER — Ambulatory Visit (INDEPENDENT_AMBULATORY_CARE_PROVIDER_SITE_OTHER): Payer: Medicare Other | Admitting: Emergency Medicine

## 2016-10-17 VITALS — BP 145/73 | HR 61 | Temp 97.9°F | Resp 16 | Ht 63.5 in | Wt 163.0 lb

## 2016-10-17 DIAGNOSIS — Z008 Encounter for other general examination: Secondary | ICD-10-CM

## 2016-10-17 NOTE — Patient Instructions (Signed)
     IF you received an x-ray today, you will receive an invoice from [facility]. Please contact [facility] at [phone] with questions or concerns regarding your invoice.   IF you received labwork today, you will receive an invoice from [facility]. Please contact [facility] at [phone] with questions or concerns regarding your invoice.   Our billing staff will not be able to assist you with questions regarding bills from these companies.  You will be contacted with the lab results as soon as they are available. The fastest way to get your results is to activate your My Chart account. Instructions are located on the last page of this paperwork. If you have not heard from us regarding the results in 2 weeks, please contact this office.     

## 2016-10-17 NOTE — Progress Notes (Signed)
DMV form needs to be filled out by a physician with a cardio-pulmonary specialty as per instructions.

## 2017-05-03 ENCOUNTER — Ambulatory Visit (INDEPENDENT_AMBULATORY_CARE_PROVIDER_SITE_OTHER): Payer: Medicare Other

## 2017-05-03 ENCOUNTER — Encounter: Payer: Self-pay | Admitting: Emergency Medicine

## 2017-05-03 ENCOUNTER — Ambulatory Visit (INDEPENDENT_AMBULATORY_CARE_PROVIDER_SITE_OTHER): Payer: Medicare Other | Admitting: Emergency Medicine

## 2017-05-03 ENCOUNTER — Other Ambulatory Visit: Payer: Self-pay

## 2017-05-03 VITALS — BP 122/68 | HR 60 | Temp 97.8°F | Resp 12 | Ht 63.5 in | Wt 161.4 lb

## 2017-05-03 DIAGNOSIS — M25571 Pain in right ankle and joints of right foot: Secondary | ICD-10-CM

## 2017-05-03 DIAGNOSIS — S93401A Sprain of unspecified ligament of right ankle, initial encounter: Secondary | ICD-10-CM | POA: Diagnosis not present

## 2017-05-03 NOTE — Patient Instructions (Addendum)
     IF you received an x-ray today, you will receive an invoice from [facility]. Please contact [facility] at [phone] with questions or concerns regarding your invoice.   IF you received labwork today, you will receive an invoice from [facility]. Please contact [facility] at [phone] with questions or concerns regarding your invoice.   Our billing staff will not be able to assist you with questions regarding bills from these companies.  You will be contacted with the lab results as soon as they are available. The fastest way to get your results is to activate your My Chart account. Instructions are located on the last page of this paperwork. If you have not heard from [place] regarding the results in 2 weeks, please contact this office.      Ankle Sprain An ankle sprain is a stretch or tear in one of the tough tissues (ligaments) in your ankle. Follow these instructions at home:  Rest your ankle.  Take over-the-counter and prescription medicines only as told by your doctor.  For 2-3 days, keep your ankle higher than the level of your heart (elevated) as much as possible.  If directed, put ice on the area: ? Put ice in a plastic bag. ? Place a towel between your skin and the bag. ? Leave the ice on for 20 minutes, 2-3 times a day.  If you were given a brace: ? Wear it as told. ? Take it off to shower or bathe. ? Try not to move your ankle much, but wiggle your toes from time to time. This helps to prevent swelling.  If you were given an elastic bandage (dressing): ? Take it off when you shower or bathe. ? Try not to move your ankle much, but wiggle your toes from time to time. This helps to prevent swelling. ? Adjust the bandage to make it more comfortable if it feels too tight. ? Loosen the bandage if you lose feeling in your foot, your foot tingles, or your foot gets cold and blue.  If you have crutches, use them as told by your doctor. Continue to use  them until you can walk without feeling pain in your ankle. Contact a doctor if:  Your bruises or swelling are quickly getting worse.  Your pain does not get better after you take medicine. Get help right away if:  You cannot feel your toes or foot.  Your toes or your foot looks blue.  You have very bad pain that gets worse. This information is not intended to replace advice given to you by your health care provider. Make sure you discuss any questions you have with your health care provider. Document Released: 10/31/2007 Document Revised: 10/20/2015 Document Reviewed: 12/14/2014 Elsevier Interactive Patient Education  [employer].

## 2017-05-03 NOTE — Progress Notes (Signed)
Julie Pacheco 71 y.o.   Chief Complaint  Patient presents with  . Foot Pain    x2 weeks ago after fall, right foot swelling. 3/10, prolonged standing makes it worse, feels warm, ice and and heat not helping.  . Knee Pain    x2 weeks. 3/10.     HISTORY OF PRESENT ILLNESS: This is a 71 y.o. female complaining of right ankle pain since injury 2 weeks ago.  Ankle Injury   The incident occurred more than 1 week ago. The incident occurred at home. The injury mechanism was a twisting injury. The pain is present in the right knee and right ankle. The quality of the pain is described as aching. The pain is at a severity of 3/10. The pain is mild. Pertinent negatives include no inability to bear weight, loss of motion, loss of sensation, muscle weakness, numbness or tingling. The symptoms are aggravated by movement and palpation. She has tried ice and heat for the symptoms. The treatment provided mild relief.     Prior to Admission medications   Medication Sig Start Date End Date Taking? Authorizing Provider  levothyroxine (SYNTHROID, LEVOTHROID) 50 MCG tablet TAKE ONE TABLET BY MOUTH ONCE DAILY BEFORE  BREAKFAST 07/19/16  Yes Diar Berkel, Ines Bloomer, MD    No Known Allergies  Patient Active Problem List   Diagnosis Date Noted  . Transient hypertension 09/12/2016  . Lipid screening 09/12/2016  . Elevated blood-pressure reading, without diagnosis of hypertension 09/12/2016  . Screening for cholesterol level 09/12/2016  . Acute bronchitis 06/28/2016  . Hypothyroidism 10/31/2015  . Vitamin D deficiency 10/31/2015  . BMI 28.0-28.9,adult 10/31/2015  . Osteopenia 12/03/2014  . 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 Insomnia 09/07/2011    Past Medical History:  Diagnosis Date  . Migraines   . Thyroid disease     No past surgical history on file.  Social History   Socioeconomic History  . Marital status: Married    Spouse name: Not on file  . Number of children: Not on file  . Years  of education: Not on file  . Highest education level: Not on file  Social Needs  . Financial resource strain: Not on file  . Food insecurity - worry: Not on file  . Food insecurity - inability: Not on file  . Transportation needs - medical: Not on file  . Transportation needs - non-medical: Not on file  Occupational History  . Not on file  Tobacco Use  . Smoking status: Former Smoker    Last attempt to quit: 12/15/1981    Years since quitting: 35.4  . Smokeless tobacco: Never Used  Substance and Sexual Activity  . Alcohol use: Not on file  . Drug use: Not on file  . Sexual activity: Not on file  Other Topics Concern  . Not on file  Social History Narrative  . Not on file    Family History  Problem Relation Age of Onset  . Breast cancer Maternal Aunt      Review of Systems  Constitutional: Negative.  Negative for chills and fever.  Respiratory: Negative for shortness of breath.   Gastrointestinal: Negative for nausea and vomiting.  Musculoskeletal: Positive for joint pain (right ankle and knee).  Skin: Negative for rash.  Neurological: Negative for tingling, focal weakness and numbness.  All other systems reviewed and are negative.  Vitals:   05/03/17 1012  BP: 122/68  Pulse: 60  Resp: 12  Temp: 97.8 F (36.6 C)  SpO2: 96%  Physical Exam  Constitutional: She is oriented to person, place, and time. She appears well-developed and well-nourished.  HENT:  Head: Normocephalic.  Eyes: EOM are normal. Pupils are equal, round, and reactive to light.  Neck: Normal range of motion.  Cardiovascular: Normal rate.  Pulmonary/Chest: Effort normal.  Musculoskeletal:  RLE: knee: non-tender with FROM; ankle: mild swelling and tenderness, FROM, NVI.  Neurological: She is alert and oriented to person, place, and time. No sensory deficit. She exhibits normal muscle tone.  Skin: Skin is warm and dry. Capillary refill takes less than 2 seconds.  Psychiatric: She has a normal  mood and affect. Her behavior is normal.  Vitals reviewed.   Dg Ankle Complete Right  Result Date: 05/03/2017 CLINICAL DATA:  Right ankle pain common no known injury, initial encounter EXAM: RIGHT ANKLE - COMPLETE 3+ VIEW COMPARISON:  None. FINDINGS: Mild soft tissue swelling is noted about the ankle. No acute fracture or dislocation is noted. IMPRESSION: Soft tissue swelling without acute bony abnormality. Electronically Signed   By: Inez Catalina M.D.   On: 05/03/2017 10:25    ASSESSMENT & PLAN: Julie Pacheco was seen today for foot pain and knee pain.  Diagnoses and all orders for this visit:  Acute right ankle pain -     DG Ankle Complete Right  Sprain of right ankle, unspecified ligament, initial encounter    Patient Instructions       IF you received an x-ray today, you will receive an invoice from [facility]. Please contact [facility] at [phone] with questions or concerns regarding your invoice.   IF you received labwork today, you will receive an invoice from [place]. Please contact [facility] at [phone] with questions or concerns regarding your invoice.   Our billing staff will not be able to assist you with questions regarding bills from these companies.  You will be contacted with the lab results as soon as they are available. The fastest way to get your results is to activate your My Chart account. Instructions are located on the last page of this paperwork. If you have not heard from [place] regarding the results in 2 weeks, please contact this office.      Ankle Sprain An ankle sprain is a stretch or tear in one of the tough tissues (ligaments) in your ankle. Follow these instructions at home:  Rest your ankle.  Take over-the-counter and prescription medicines only as told by your doctor.  For 2-3 days, keep your ankle higher than the level of your heart (elevated) as much as possible.  If directed, put ice on the area: ? Put ice in a  plastic bag. ? Place a towel between your skin and the bag. ? Leave the ice on for 20 minutes, 2-3 times a day.  If you were given a brace: ? Wear it as told. ? Take it off to shower or bathe. ? Try not to move your ankle much, but wiggle your toes from time to time. This helps to prevent swelling.  If you were given an elastic bandage (dressing): ? Take it off when you shower or bathe. ? Try not to move your ankle much, but wiggle your toes from time to time. This helps to prevent swelling. ? Adjust the bandage to make it more comfortable if it feels too tight. ? Loosen the bandage if you lose feeling in your foot, your foot tingles, or your foot gets cold and blue.  If you have crutches, use them as told by  your doctor. Continue to use them until you can walk without feeling pain in your ankle. Contact a doctor if:  Your bruises or swelling are quickly getting worse.  Your pain does not get better after you take medicine. Get help right away if:  You cannot feel your toes or foot.  Your toes or your foot looks blue.  You have very bad pain that gets worse. This information is not intended to replace advice given to you by your health care provider. Make sure you discuss any questions you have with your health care provider. Document Released: 10/31/2007 Document Revised: 10/20/2015 Document Reviewed: 12/14/2014 Elsevier Interactive Patient Education  2018 Elsevier Inc.      Agustina Caroli, MD [facility]

## 2017-07-12 ENCOUNTER — Other Ambulatory Visit: Payer: Self-pay | Admitting: Emergency Medicine

## 2017-07-13 NOTE — Telephone Encounter (Signed)
Refill req for Levothyroxine to Dr. Mitchel Honour Pt has not been see nor had labs for TSH since 2017. Please advise

## 2017-07-16 ENCOUNTER — Other Ambulatory Visit: Payer: Self-pay | Admitting: Emergency Medicine

## 2017-07-16 DIAGNOSIS — Z1231 Encounter for screening mammogram for malignant neoplasm of breast: Secondary | ICD-10-CM

## 2017-09-04 ENCOUNTER — Ambulatory Visit
Admission: RE | Admit: 2017-09-04 | Discharge: 2017-09-04 | Disposition: A | Discharge status: Home / Self Care | Payer: Medicare Other | Source: Ambulatory Visit | Attending: Emergency Medicine | Admitting: Emergency Medicine

## 2017-09-04 DIAGNOSIS — Z1231 Encounter for screening mammogram for malignant neoplasm of breast: Secondary | ICD-10-CM | POA: Diagnosis not present

## 2017-11-21 ENCOUNTER — Other Ambulatory Visit: Payer: Self-pay

## 2017-11-21 ENCOUNTER — Encounter: Payer: Self-pay | Admitting: Physician Assistant

## 2017-11-21 ENCOUNTER — Ambulatory Visit (INDEPENDENT_AMBULATORY_CARE_PROVIDER_SITE_OTHER): Payer: Medicare Other | Admitting: Physician Assistant

## 2017-11-21 VITALS — BP 126/72 | HR 48 | Temp 98.3°F | Resp 16 | Ht 62.0 in | Wt 161.4 lb

## 2017-11-21 DIAGNOSIS — M542 Cervicalgia: Secondary | ICD-10-CM | POA: Diagnosis not present

## 2017-11-21 DIAGNOSIS — R001 Bradycardia, unspecified: Secondary | ICD-10-CM

## 2017-11-21 MED ORDER — NAPROXEN 500 MG PO TABS: 500.0000 mg | ORAL_TABLET | Freq: Every day | ORAL | 0 refills | Status: DC

## 2017-11-21 NOTE — Progress Notes (Signed)
11/21/2017 4:16 PM   DOB: 1946-04-26 / MRN: [id number]  SUBJECTIVE:  Julie Pacheco is a 72 y.o. female presenting for right sided posterior neck pain. Symptoms present for about 4 days.  The problem is worseing. She has tried one aleve. Pain worsens with movement.  She has never had this problem before. NO trauma recently.   She has No Known Allergies.   She  has a past medical history of Migraines and Thyroid disease.    She  reports that she quit smoking about 35 years ago. She has never used smokeless tobacco. She  has no sexual activity history on file. The patient  has no past surgical history on file.  Her family history includes Breast cancer in her maternal aunt.  Review of Systems  Constitutional: Negative for chills, diaphoresis and fever.  Respiratory: Negative for cough, hemoptysis, sputum production, shortness of breath and wheezing.   Cardiovascular: Negative for chest pain, orthopnea and leg swelling.  Gastrointestinal: Negative for nausea.  Musculoskeletal: Positive for myalgias and neck pain. Negative for back pain, falls and joint pain.  Skin: Negative for rash.  Neurological: Negative for dizziness.    The problem list and medications were reviewed and updated by myself where necessary and exist elsewhere in the encounter.   OBJECTIVE:  BP 126/72 (BP Location: Left Arm, Patient Position: Sitting, Cuff Size: Normal)   Pulse (!) 48   Temp 98.3 F (36.8 C) (Oral)   Resp 16   Ht 5\' 2"  (1.575 m)   Wt 161 lb 6.4 oz (73.2 kg)   SpO2 96%   BMI 29.52 kg/m   Pulse Readings from Last 3 Encounters:  11/21/17 (!) 48  05/03/17 60  10/17/16 61     Wt Readings from Last 3 Encounters:  11/21/17 161 lb 6.4 oz (73.2 kg)  05/03/17 161 lb 6.4 oz (73.2 kg)  10/17/16 163 lb (73.9 kg)   Temp Readings from Last 3 Encounters:  11/21/17 98.3 F (36.8 C) (Oral)  05/03/17 97.8 F (36.6 C)  10/17/16 97.9 F (36.6 C) (Oral)   BP Readings from Last 3 Encounters:    11/21/17 126/72  05/03/17 122/68  10/17/16 (!) 145/73   Pulse Readings from Last 3 Encounters:  11/21/17 (!) 48  05/03/17 60  10/17/16 61    Physical Exam  Constitutional: She is oriented to person, place, and time. She appears well-nourished. No distress.  Eyes: Pupils are equal, round, and reactive to light. EOM are normal.  Cardiovascular: Normal rate, regular rhythm, S1 normal, S2 normal, normal heart sounds and intact distal pulses. Exam reveals no gallop, no friction rub and no decreased pulses.  No murmur heard. Pulmonary/Chest: Effort normal. No stridor. No respiratory distress. She has no wheezes. She has no rales.  Abdominal: She exhibits no distension.  Musculoskeletal: She exhibits tenderness (Cervical paraspinal TTP.  No rash, no mass, no lymphadenopathy about the occiput.). She exhibits no edema.  Neurological: She is alert and oriented to person, place, and time. No cranial nerve deficit. Gait normal.  Skin: Skin is dry. She is not diaphoretic.  Psychiatric: She has a normal mood and affect.  Vitals reviewed.   No results found for: HGBA1C  Lab Results  Component Value Date   WBC 6.2 09/12/2016   HGB 14.4 09/12/2016   HCT 42.2 09/12/2016   MCV 92 09/12/2016   PLT 216 09/12/2016    Lab Results  Component Value Date   CREATININE 0.69 09/12/2016   BUN 11 09/12/2016  NA 143 09/12/2016   K 4.9 09/12/2016   CL 105 09/12/2016   CO2 23 09/12/2016    Lab Results  Component Value Date   ALT 27 09/12/2016   AST 23 09/12/2016   ALKPHOS 68 09/12/2016   BILITOT 0.4 09/12/2016    Lab Results  Component Value Date   TSH 3.71 10/31/2015    Lab Results  Component Value Date   CHOL 198 10/31/2015   HDL 64 10/31/2015   LDLCALC 113 10/31/2015   TRIG 106 10/31/2015   CHOLHDL 3.1 10/31/2015     ASSESSMENT AND PLAN:  Julie Pacheco was seen today for neck pain.  Diagnoses and all orders for this visit:  Neck pain: MSK in origin.  Will treat to that end.   -     naproxen (NAPROSYN) 500 MG tablet; Take 1 tablet (500 mg total) by mouth at bedtime.  Bradycardia Comments: Not new. EKG in 2014 showing sinus brady.  Patient asymtpomatic today.     The patient is advised to call or return to clinic if she does not see an improvement in symptoms, or to seek the care of the closest emergency department if she worsens with the above plan.   Philis Fendt, MHS, PA-C [facility] 11/21/2017 4:16 PM

## 2017-11-21 NOTE — Patient Instructions (Addendum)
Take tylenol 1000 mg every 8 hours along with aleve at night.     IF you received an x-ray today, you will receive an invoice from [facility]. Please contact [facility] at [phone] with questions or concerns regarding your invoice.   IF you received labwork today, you will receive an invoice from [facility]. Please contact [facility] at [phone] with questions or concerns regarding your invoice.   Our billing staff will not be able to assist you with questions regarding bills from these companies.  You will be contacted with the lab results as soon as they are available. The fastest way to get your results is to activate your My Chart account. Instructions are located on the last page of this paperwork. If you have not heard from [place] regarding the results in 2 weeks, please contact this office.

## 2018-02-10 ENCOUNTER — Ambulatory Visit: Payer: Medicare Other | Admitting: Family Medicine

## 2018-02-11 ENCOUNTER — Ambulatory Visit: Payer: Medicare Other | Admitting: Emergency Medicine

## 2018-02-12 ENCOUNTER — Encounter: Payer: Self-pay | Admitting: Urgent Care

## 2018-02-12 ENCOUNTER — Telehealth: Payer: Self-pay | Admitting: Urgent Care

## 2018-02-12 ENCOUNTER — Ambulatory Visit (INDEPENDENT_AMBULATORY_CARE_PROVIDER_SITE_OTHER): Payer: Medicare Other | Admitting: Urgent Care

## 2018-02-12 ENCOUNTER — Other Ambulatory Visit: Payer: Self-pay

## 2018-02-12 VITALS — BP 163/72 | HR 55 | Temp 98.0°F | Resp 18 | Ht 62.0 in | Wt 163.4 lb

## 2018-02-12 DIAGNOSIS — R05 Cough: Secondary | ICD-10-CM | POA: Diagnosis not present

## 2018-02-12 DIAGNOSIS — J029 Acute pharyngitis, unspecified: Secondary | ICD-10-CM

## 2018-02-12 DIAGNOSIS — H5789 Other specified disorders of eye and adnexa: Secondary | ICD-10-CM | POA: Diagnosis not present

## 2018-02-12 DIAGNOSIS — L299 Pruritus, unspecified: Secondary | ICD-10-CM | POA: Diagnosis not present

## 2018-02-12 DIAGNOSIS — H01119 Allergic dermatitis of unspecified eye, unspecified eyelid: Secondary | ICD-10-CM

## 2018-02-12 DIAGNOSIS — R059 Cough, unspecified: Secondary | ICD-10-CM

## 2018-02-12 DIAGNOSIS — R030 Elevated blood-pressure reading, without diagnosis of hypertension: Secondary | ICD-10-CM

## 2018-02-12 LAB — POCT RAPID STREP A (OFFICE): Rapid Strep A Screen: NEGATIVE

## 2018-02-12 MED ORDER — ACETAMINOPHEN-CODEINE 120-12 MG/5ML PO SOLN: 5.0000 mL | Freq: Every evening | ORAL | 0 refills | Status: DC | PRN

## 2018-02-12 MED ORDER — TOBRAMYCIN 0.3 % OP SOLN: 2.0000 [drp] | OPHTHALMIC | 0 refills | Status: DC

## 2018-02-12 MED ORDER — BENZONATATE 100 MG PO CAPS: 100.0000 mg | ORAL_CAPSULE | Freq: Three times a day (TID) | ORAL | 0 refills | Status: DC | PRN

## 2018-02-12 NOTE — Patient Instructions (Addendum)
You can try either Zyrtec, Allegra, Claritin to address any allergy component causing post-nasal drainage and as a result your cough. Use Opcon A as an allergy eye drop, which can address a contact dermatitis from your eyeliner pencil. You will have tobramycin that is an antibiotic eye drop that can address bacterial conjunctivitis.    Allergic Conjunctivitis, Adult Allergic conjunctivitis is inflammation of the clear membrane that covers the white part of your eye and the inner surface of your eyelid (conjunctiva). The inflammation is caused by allergies. The blood vessels in the conjunctiva become inflamed and this causes the eyes to become red or pink. The eyes often feel itchy. Allergic conjunctivitis cannot be spread from one person to another person (is not contagious). What are the causes? This condition is caused by an allergic reaction. Common causes of an allergic reaction (allergens) include:  Outdoor allergens, such as: ? Pollen. ? Grass and weeds. ? Mold spores.  Indoor allergens, such as: ? Dust. ? Smoke. ? Mold. ? Pet dander. ? Animal hair.  What increases the risk? You may be more likely to develop this condition if you have a family history of allergies, such as:  Allergic rhinitis.  Bronchial asthma.  Atopic dermatitis.  What are the signs or symptoms? Symptoms of this condition include eyes that are:  Itchy.  Red.  Watery.  Puffy.  Your eyes may also:  Sting or burn.  Have clear drainage coming from them.  How is this diagnosed? This condition may be diagnosed by medical history and physical exam. If you have drainage from your eyes, it may be tested to rule out other causes of conjunctivitis. You may also need to see a health care provider who specializes in treating allergies (allergist) or eye conditions (ophthalmologist) for tests to confirm the diagnosis. You may have:  Skin tests to see which allergens are causing your symptoms. These tests  involve pricking the skin with a tiny needle and exposing the skin to small amounts of potential allergens to see if your skin reacts.  Blood tests.  Tissue scrapings from your eyelid. These will be examined under a microscope.  How is this treated? Treatments for this condition may include:  Cold cloths (compresses) to soothe itching and swelling.  Washing the face to remove allergens.  Eye drops. These may be prescription or over-the-counter. There are several different types. You may need to try different types to see which one works best for you. Your may need: ? Eye drops that block the allergic reaction (antihistamine). ? Eye drops that reduce swelling and irritation (anti-inflammatory). ? Steroid eye drops to lessen a severe reaction (vernal conjunctivitis).  Oral antihistamine medicines to reduce your allergic reaction. You may need these if eye drops do not help or are difficult to use.  Follow these instructions at home:  Avoid known allergens whenever possible.  Take or apply over-the-counter and prescription medicines only as told by your health care provider. These include any eye drops.  Apply a cool, clean washcloth to your eye for 10-20 minutes, 3-4 times a day.  Do not touch or rub your eyes.  Do not wear contact lenses until the inflammation is gone. Wear glasses instead.  Do not wear eye makeup until the inflammation is gone.  Keep all follow-up visits as told by your health care provider. This is important. Contact a health care provider if:  Your symptoms get worse or do not improve with treatment.  You have mild eye pain.  You have sensitivity to light.  You have spots or blisters on your eyes.  You have pus draining from your eye.  You have a fever. Get help right away if:  You have redness, swelling, or other symptoms in only one eye.  Your vision is blurred or you have vision changes.  You have severe eye pain. This information is not  intended to replace advice given to you by your health care provider. Make sure you discuss any questions you have with your health care provider. Document Released: 08/04/2002 Document Revised: 01/11/2016 Document Reviewed: 11/25/2015 Elsevier Interactive Patient Education  2018 [employer].   Cough, Adult Coughing is a reflex that clears your throat and your airways. Coughing helps to heal and protect your lungs. It is normal to cough occasionally, but a cough that happens with other symptoms or lasts a long time may be a sign of a condition that needs treatment. A cough may last only 2-3 weeks (acute), or it may last longer than 8 weeks (chronic). What are the causes? Coughing is commonly caused by:  Breathing in substances that irritate your lungs.  A viral or bacterial respiratory infection.  Allergies.  Asthma.  Postnasal drip.  Smoking.  Acid backing up from the stomach into the esophagus (gastroesophageal reflux).  Certain medicines.  Chronic lung problems, including COPD (or rarely, lung cancer).  Other medical conditions such as heart failure.  Follow these instructions at home: Pay attention to any changes in your symptoms. Take these actions to help with your discomfort:  Take medicines only as told by your health care provider. ? If you were prescribed an antibiotic medicine, take it as told by your health care provider. Do not stop taking the antibiotic even if you start to feel better. ? Talk with your health care provider before you take a cough suppressant medicine.  Drink enough fluid to keep your urine clear or pale yellow.  If the air is dry, use a cold steam vaporizer or humidifier in your bedroom or your home to help loosen secretions.  Avoid anything that causes you to cough at work or at home.  If your cough is worse at night, try sleeping in a semi-upright position.  Avoid cigarette smoke. If you smoke, quit smoking. If you need help quitting,  ask your health care provider.  Avoid caffeine.  Avoid alcohol.  Rest as needed.  Contact a health care provider if:  You have new symptoms.  You cough up pus.  Your cough does not get better after 2-3 weeks, or your cough gets worse.  You cannot control your cough with suppressant medicines and you are losing sleep.  You develop pain that is getting worse or pain that is not controlled with pain medicines.  You have a fever.  You have unexplained weight loss.  You have night sweats. Get help right away if:  You cough up blood.  You have difficulty breathing.  Your heartbeat is very fast. This information is not intended to replace advice given to you by your health care provider. Make sure you discuss any questions you have with your health care provider. Document Released: 11/10/2010 Document Revised: 10/20/2015 Document Reviewed: 07/21/2014 Elsevier Interactive Patient Education  [employer].   If you have lab work done today you will be contacted with your lab results within the next 2 weeks.  If you have not heard from [place] then please contact us. The fastest way to get your results is to  register for My Chart.   IF you received an x-ray today, you will receive an invoice from [facility]. Please contact [facility] at [phone] with questions or concerns regarding your invoice.   IF you received labwork today, you will receive an invoice from [place]. Please contact [facility] at [phone] with questions or concerns regarding your invoice.   Our billing staff will not be able to assist you with questions regarding bills from these companies.  You will be contacted with the lab results as soon as they are available. The fastest way to get your results is to activate your My Chart account. Instructions are located on the last page of this paperwork. If you have not heard from [place] regarding the results in 2 weeks, please contact this  office.

## 2018-02-12 NOTE — Progress Notes (Signed)
MRN: [id number] DOB: 07-05-1945  Subjective:   Julie Pacheco is a 71 y.o. female presenting for 3 to 4-day history of recurrent left eye redness, itching, swelling.  Patient has had this problem before, was prescribed tobramycin eyedrops.  Patient started using these again and ran out yesterday, is requesting a refill.  She states that her eye became bothersome with 1 of the eyeliner pencils that she was using.  Reports that since she stopped using it her symptoms have gotten better but still wants to get tobramycin for an infection of her eye.  She has also had a 1 week history of persistent cough that elicits throat discomfort.  Has also had stuffy and runny nose.  Denies a history of allergies, does not take anything for allergies.  Denies sinus pain, ear pain, chest pain, shortness of breath, wheezing.  Denies smoking cigarettes.  Has a remote history of smoking cigarettes.  Denies history of asthma, COPD.  Julie Pacheco has a current medication list which includes the following prescription(s): levothyroxine and naproxen. Also has No Known Allergies.  Julie Pacheco  has a past medical history of Migraines and Thyroid disease. Denies past surgical history.  Objective:   Vitals: BP (!) 163/72   Pulse (!) 55   Temp 98 F (36.7 C) (Oral)   Resp 18   Ht 5\' 2"  (1.575 m)   Wt 163 lb 6.4 oz (74.1 kg)   SpO2 97%   BMI 29.89 kg/m   BP Readings from Last 3 Encounters:  02/12/18 (!) 163/72  11/21/17 126/72  05/03/17 122/68    Physical Exam  Constitutional: She is oriented to person, place, and time. She appears well-developed and well-nourished.  HENT:  Right Ear: Tympanic membrane normal.  Left Ear: Tympanic membrane normal.  Nose: Mucosal edema (boggy) and rhinorrhea (mild) present. No sinus tenderness.  Throat with significant postnasal drainage.  No exudates, erythema or tonsillar abscesses.  Eyes: Pupils are equal, round, and reactive to light. EOM are normal. Right eye exhibits no  discharge. Left eye exhibits no chemosis, no discharge and no exudate. Left conjunctiva is injected (mildly). Left conjunctiva has no hemorrhage. No scleral icterus.  Cardiovascular: Normal rate, regular rhythm, normal heart sounds and intact distal pulses. Exam reveals no gallop and no friction rub.  No murmur heard. Pulmonary/Chest: Effort normal and breath sounds normal. No stridor. No respiratory distress. She has no wheezes. She has no rales.  Neurological: She is alert and oriented to person, place, and time.   Results for orders placed or performed in visit on 02/12/18 (from the past 24 hour(s))  POCT rapid strep A     Status: None   Collection Time: 02/12/18  2:26 PM  Result Value Ref Range   Rapid Strep A Screen Negative Negative    Assessment and Plan :   Allergic contact dermatitis of eyelid  Redness of eye, left  Itching  Sore throat - Plan: POCT rapid strep A, Culture, Group A Strep, Care order/instruction:  Cough  Elevated blood-pressure reading, without diagnosis of hypertension  Counseled patient on possibility that she has an allergic contact dermatitis/allergic conjunctivitis from using the eyeliner pencil.  I recommend that she avoid using that eyeliner again and use Opcon-A.  To address patient's concern of eye infection, I prescribed her tobramycin again.  Her physical exam findings regarding her cough for very reassuring.  I suspect that there may be an allergy component there as well given her postnasal drainage, nature of her cough.  I  offered to have a chest x-ray done at [facility] imaging given that we do not have the radiology tech today, patient refused this today.  Will use supportive care including cough suppression medications.  I did recommend patient start an allergy medication to address this as a source of her symptoms. Return-to-clinic precautions discussed, patient verbalized understanding.  We will monitor her blood pressure at home.  Julie Eagles,  PA-C [facility] [phone] 02/12/2018  2:17 PM

## 2018-02-12 NOTE — Telephone Encounter (Signed)
Copied from [place] [id number]. Topic: General - Other >> Feb 12, 2018  3:24 PM Judyann Munson wrote: Reason for CRM: [facility] is calling to advise they are out of stock for acetaminophen-codeine 120-12 MG/5ML solution, they are unable to transfer the prescription because it has not been filled yet, they are wanting call back in regards to closing out this request the best contact number is [phone]

## 2018-02-13 ENCOUNTER — Other Ambulatory Visit: Payer: Self-pay | Admitting: Family Medicine

## 2018-02-13 ENCOUNTER — Other Ambulatory Visit: Payer: Self-pay

## 2018-02-13 MED ORDER — ACETAMINOPHEN-CODEINE 120-12 MG/5ML PO SOLN: 5.0000 mL | Freq: Every evening | ORAL | 0 refills | Status: DC | PRN

## 2018-02-14 LAB — CULTURE, GROUP A STREP: Strep A Culture: NEGATIVE

## 2018-03-12 DIAGNOSIS — L821 Other seborrheic keratosis: Secondary | ICD-10-CM | POA: Diagnosis not present

## 2018-03-12 DIAGNOSIS — D229 Melanocytic nevi, unspecified: Secondary | ICD-10-CM | POA: Diagnosis not present

## 2018-06-17 ENCOUNTER — Telehealth: Payer: Self-pay | Admitting: *Deleted

## 2018-06-17 NOTE — Telephone Encounter (Signed)
Left message to schedule AWV for medicare

## 2018-06-24 ENCOUNTER — Telehealth: Payer: Self-pay | Admitting: *Deleted

## 2018-06-24 NOTE — Telephone Encounter (Signed)
Faxed refill request for levothyroxine 50 mc #90 to [facility] in [place]. Confirmation page received at 12:21 pm.

## 2018-07-03 DIAGNOSIS — J22 Unspecified acute lower respiratory infection: Secondary | ICD-10-CM | POA: Diagnosis not present

## 2018-07-23 ENCOUNTER — Other Ambulatory Visit: Payer: Self-pay | Admitting: *Deleted

## 2018-07-23 ENCOUNTER — Ambulatory Visit (INDEPENDENT_AMBULATORY_CARE_PROVIDER_SITE_OTHER): Payer: Medicare Other | Admitting: Emergency Medicine

## 2018-07-23 ENCOUNTER — Ambulatory Visit: Payer: Medicare Other | Admitting: Emergency Medicine

## 2018-07-23 VITALS — BP 146/73 | Ht 62.5 in | Wt 156.4 lb

## 2018-07-23 DIAGNOSIS — Z0000 Encounter for general adult medical examination without abnormal findings: Secondary | ICD-10-CM

## 2018-07-23 MED ORDER — LEVOTHYROXINE SODIUM 50 MCG PO TABS: ORAL_TABLET | ORAL | 0 refills | Status: DC

## 2018-07-23 NOTE — Patient Instructions (Signed)
Advance Directive  Advance directives are legal documents that let you make choices ahead of time about your health care and medical treatment in case you become unable to communicate for yourself. Advance directives are a way for you to communicate your wishes to family, friends, and health care providers. This can help convey your decisions about end-of-life care if you become unable to communicate. Discussing and writing advance directives should happen over time rather than all at once. Advance directives can be changed depending on your situation and what you want, even after you have signed the advance directives. If you do not have an advance directive, some states assign family decision makers to act on your behalf based on how closely you are related to them. Each state has its own laws regarding advance directives. You may want to check with your health care provider, attorney, or state representative about the laws in your state. There are different types of advance directives, such as:  Medical power of attorney.  Living will.  Do not resuscitate (DNR) or do not attempt resuscitation (DNAR) order. Health care proxy and medical power of attorney A health care proxy, also called a health care agent, is a person who is appointed to make medical decisions for you in cases in which you are unable to make the decisions yourself. Generally, people choose someone they know well and trust to represent their preferences. Make sure to ask this person for an agreement to act as your proxy. A proxy may have to exercise judgment in the event of a medical decision for which your wishes are not known. A medical power of attorney is a legal document that names your health care proxy. Depending on the laws in your state, after the document is written, it may also need to be:  Signed.  Notarized.  Dated.  Copied.  Witnessed.  Incorporated into your medical record. You may also want to appoint  someone to manage your financial affairs in a situation in which you are unable to do so. This is called a durable power of attorney for finances. It is a separate legal document from the durable power of attorney for health care. You may choose the same person or someone different from your health care proxy to act as your agent in financial matters. If you do not appoint a proxy, or if there is a concern that the proxy is not acting in your best interests, a court-appointed guardian may be designated to act on your behalf. Living will A living will is a set of instructions documenting your wishes about medical care when you cannot express them yourself. Health care providers should keep a copy of your living will in your medical record. You may want to give a copy to family members or friends. To alert caregivers in case of an emergency, you can place a card in your wallet to let them know that you have a living will and where they can find it. A living will is used if you become:  Terminally ill.  Incapacitated.  Unable to communicate or make decisions. Items to consider in your living will include:  The use or non-use of life-sustaining equipment, such as dialysis machines and breathing machines (ventilators).  A DNR or DNAR order, which is the instruction not to use cardiopulmonary resuscitation (CPR) if breathing or heartbeat stops.  The use or non-use of tube feeding.  Withholding of food and fluids.  Comfort (palliative) care when the goal becomes comfort rather  than a cure.  Organ and tissue donation. A living will does not give instructions for distributing your money and property if you should pass away. It is recommended that you seek the advice of a lawyer when writing a will. Decisions about taxes, beneficiaries, and asset distribution will be legally binding. This process can relieve your family and friends of any concerns surrounding disputes or questions that may come up about  the distribution of your assets. DNR or DNAR A DNR or DNAR order is a request not to have CPR in the event that your heart stops beating or you stop breathing. If a DNR or DNAR order has not been made and shared, a health care provider will try to help any patient whose heart has stopped or who has stopped breathing. If you plan to have surgery, talk with your health care provider about how your DNR or DNAR order will be followed if problems occur. Summary  Advance directives are the legal documents that allow you to make choices ahead of time about your health care and medical treatment in case you become unable to communicate for yourself.  The process of discussing and writing advance directives should happen over time. You can change the advance directives, even after you have signed them.  Advance directives include DNR or DNAR orders, living wills, and designating an agent as your medical power of attorney. This information is not intended to replace advice given to you by your health care provider. Make sure you discuss any questions you have with your health care provider. Document Released: 08/21/2007 Document Revised: 04/02/2016 Document Reviewed: 04/02/2016 Elsevier Interactive Patient Education  2019 [place] Following a healthy eating pattern may help you to achieve and maintain a healthy body weight, reduce the risk of chronic disease, and live a long and productive life. It is important to follow a healthy eating pattern at an appropriate calorie level for your body. Your nutritional needs should be met primarily through food by choosing a variety of nutrient-rich foods. What are tips for following this plan? Reading food labels  Read labels and choose the following: ? Reduced or low sodium. ? Juices with 100% fruit juice. ? Foods with low saturated fats and high polyunsaturated and monounsaturated fats. ? Foods with whole grains, such as whole wheat, cracked  wheat, brown rice, and wild rice. ? Whole grains that are fortified with folic acid. This is recommended for women who are pregnant or who want to become pregnant.  Read labels and avoid the following: ? Foods with a lot of added sugars. These include foods that contain brown sugar, corn sweetener, corn syrup, dextrose, fructose, glucose, high-fructose corn syrup, honey, invert sugar, lactose, malt syrup, maltose, molasses, raw sugar, sucrose, trehalose, or turbinado sugar.  Do not eat more than the following amounts of added sugar per day:  6 teaspoons (25 g) for women.  9 teaspoons (38 g) for men. ? Foods that contain processed or refined starches and grains. ? Refined grain products, such as white flour, degermed cornmeal, white bread, and white rice. Shopping  Choose nutrient-rich snacks, such as vegetables, whole fruits, and nuts. Avoid high-calorie and high-sugar snacks, such as potato chips, fruit snacks, and candy.  Use oil-based dressings and spreads on foods instead of solid fats such as butter, stick margarine, or cream cheese.  Limit pre-made sauces, mixes, and "instant" products such as flavored rice, instant noodles, and ready-made pasta.  Try more plant-protein sources, such as tofu, tempeh,  black beans, edamame, lentils, nuts, and seeds.  Explore eating plans such as the Mediterranean diet or vegetarian diet. Cooking  Use oil to saut or stir-fry foods instead of solid fats such as butter, stick margarine, or lard.  Try baking, boiling, grilling, or broiling instead of frying.  Remove the fatty part of meats before cooking.  Steam vegetables in water or broth. Meal planning   At meals, imagine dividing your plate into fourths: ? One-half of your plate is fruits and vegetables. ? One-fourth of your plate is whole grains. ? One-fourth of your plate is protein, especially lean meats, poultry, eggs, tofu, beans, or nuts.  Include low-fat dairy as part of your  daily diet. Lifestyle  Choose healthy options in all settings, including home, work, school, restaurants, or stores.  Prepare your food safely: ? Wash your hands after handling raw meats. ? Keep food preparation surfaces clean by regularly washing with hot, soapy water. ? Keep raw meats separate from ready-to-eat foods, such as fruits and vegetables. ? Cook seafood, meat, poultry, and eggs to the recommended internal temperature. ? Store foods at safe temperatures. In general:  Keep cold foods at 55F (4.4C) or below.  Keep hot foods at 155F (60C) or above.  Keep your freezer at [facility] (-17.8C) or below.  Foods are no longer safe to eat when they have been between the temperatures of 40-155F (4.4-60C) for more than 2 hours. What foods should I eat? Fruits Aim to eat 2 cup-equivalents of fresh, canned (in natural juice), or frozen fruits each day. Examples of 1 cup-equivalent of fruit include 1 small apple, 8 large strawberries, 1 cup canned fruit,  cup dried fruit, or 1 cup 100% juice. Vegetables Aim to eat 2-3 cup-equivalents of fresh and frozen vegetables each day, including different varieties and colors. Examples of 1 cup-equivalent of vegetables include 2 medium carrots, 2 cups raw, leafy greens, 1 cup chopped vegetable (raw or cooked), or 1 medium baked potato. Grains Aim to eat 6 ounce-equivalents of whole grains each day. Examples of 1 ounce-equivalent of grains include 1 slice of bread, 1 cup ready-to-eat cereal, 3 cups popcorn, or  cup cooked rice, pasta, or cereal. Meats and other proteins Aim to eat 5-6 ounce-equivalents of protein each day. Examples of 1 ounce-equivalent of protein include 1 egg, 1/2 cup nuts or seeds, or 1 tablespoon (16 g) peanut butter. A cut of meat or fish that is the size of a deck of cards is about 3-4 ounce-equivalents.  Of the protein you eat each week, try to have at least 8 ounces come from seafood. This includes salmon, trout, herring, and  anchovies. Dairy Aim to eat 3 cup-equivalents of fat-free or low-fat dairy each day. Examples of 1 cup-equivalent of dairy include 1 cup (240 mL) milk, 8 ounces (250 g) yogurt, 1 ounces (44 g) natural cheese, or 1 cup (240 mL) fortified soy milk. Fats and oils  Aim for about 5 teaspoons (21 g) per day. Choose monounsaturated fats, such as canola and olive oils, avocados, peanut butter, and most nuts, or polyunsaturated fats, such as sunflower, corn, and soybean oils, walnuts, pine nuts, sesame seeds, sunflower seeds, and flaxseed. Beverages  Aim for six 8-oz glasses of water per day. Limit coffee to three to five 8-oz cups per day.  Limit caffeinated beverages that have added calories, such as soda and energy drinks.  Limit alcohol intake to no more than 1 drink a day for nonpregnant women and 2 drinks a day  for men. One drink equals 12 oz of beer (355 mL), 5 oz of wine (148 mL), or 1 oz of hard liquor (44 mL). Seasoning and other foods  Avoid adding excess amounts of salt to your foods. Try flavoring foods with herbs and spices instead of salt.  Avoid adding sugar to foods.  Try using oil-based dressings, sauces, and spreads instead of solid fats. This information is based on general [place] nutrition guidelines. For more information, visit choosemyplate.gov. Exact amounts may vary based on your nutrition needs. Summary  A healthy eating plan may help you to maintain a healthy weight, reduce the risk of chronic diseases, and stay active throughout your life.  Plan your meals. Make sure you eat the right portions of a variety of nutrient-rich foods.  Try baking, boiling, grilling, or broiling instead of frying.  Choose healthy options in all settings, including home, work, school, restaurants, or stores. This information is not intended to replace advice given to you by your health care provider. Make sure you discuss any questions you have with your health care provider. Document  Released: 08/26/2017 Document Revised: 08/26/2017 Document Reviewed: 08/26/2017 Elsevier Interactive Patient Education  2019 Elsevier Inc.  

## 2018-07-23 NOTE — Progress Notes (Addendum)
Presents today for Julie Pacheco     Patient Care Team: Horald Pollen, MD as PCP - General (Internal Medicine)  [facility] [place]    Other Screening:  Immunization status:    There are no preventive care reminders to display for this patient.   Functional Status Survey: Is the patient deaf or have difficulty hearing?: No Does the patient have difficulty concentrating, remembering, or making decisions?: No Does the patient have difficulty walking or climbing stairs?: No Does the patient have difficulty dressing or bathing?: No Does the patient have difficulty doing errands alone such as visiting a doctor's office or shopping?: No   6CIT Screen 07/23/2018  What Year? 0 points  What month? 0 points  What time? 0 points  Count back from 20 0 points  Months in reverse 0 points  Repeat phrase 0 points  Total Score 0        Clinical Support from 07/23/2018 in [place] at [facility]  AUDIT-C Score  4         Patient Active Problem List   Diagnosis Date Noted  . Acute right ankle pain 05/03/2017  . Sprain of right ankle 05/03/2017  . Transient hypertension 09/12/2016  . Lipid screening 09/12/2016  . Elevated blood-pressure reading, without diagnosis of hypertension 09/12/2016  . Screening for cholesterol level 09/12/2016  . Acute bronchitis 06/28/2016  . Hypothyroidism 10/31/2015  . Vitamin D deficiency 10/31/2015  . BMI 28.0-28.9,adult 10/31/2015  . Osteopenia 12/03/2014  . 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 Insomnia 09/07/2011     Past Medical History:  Diagnosis Date  . Migraines   . Thyroid disease      No past surgical history on file.   Family History  Problem Relation Age of Onset  . Breast cancer Maternal Aunt      Social History   Socioeconomic History  . Marital status: Married    Spouse name: Not on file  . Number of children: Not on file  . Years of education: Not on file    . Highest education level: Not on file  Occupational History  . Not on file  Social Needs  . Financial resource strain: Not on file  . Food insecurity:    Worry: Not on file    Inability: Not on file  . Transportation needs:    Medical: Not on file    Non-medical: Not on file  Tobacco Use  . Smoking status: Former Smoker    Last attempt to quit: 12/15/1981    Years since quitting: 36.6  . Smokeless tobacco: Never Used  Substance and Sexual Activity  . Alcohol use: Not on file  . Drug use: Not on file  . Sexual activity: Not on file  Lifestyle  . Physical activity:    Days per week: Not on file    Minutes per session: Not on file  . Stress: Not on file  Relationships  . Social connections:    Talks on phone: Not on file    Gets together: Not on file    Attends religious service: Not on file    Active member of club or organization: Not on file    Attends meetings of clubs or organizations: Not on file    Relationship status: Not on file  . Intimate partner violence:    Fear of current or ex partner: Not on file    Emotionally abused: Not on file  Physically abused: Not on file    Forced sexual activity: Not on file  Other Topics Concern  . Not on file  Social History Narrative  . Not on file     No Known Allergies   Prior to Admission medications   Medication Sig Start Date End Date Taking? Authorizing Provider  acetaminophen-codeine 120-12 MG/5ML solution Take 5-10 mLs by mouth at bedtime as needed. Patient not taking: Reported on 07/23/2018 02/13/18   Rutherford Guys, MD  benzonatate (TESSALON) 100 MG capsule Take 1-2 capsules (100-200 mg total) by mouth 3 (three) times daily as needed. 02/12/18   [name], PA-C  levothyroxine (SYNTHROID, LEVOTHROID) 50 MCG tablet TAKE 1 TABLET BY MOUTH ONCE DAILY BEFORE BREAKFAST 07/15/17   Horald Pollen, MD  naproxen (NAPROSYN) 500 MG tablet Take 1 tablet (500 mg total) by mouth at bedtime. Patient not taking:  Reported on 07/23/2018 11/21/17   Tereasa Coop, PA-C  tobramycin (TOBREX) 0.3 % ophthalmic solution Place 2 drops into the left eye every 4 (four) hours. Patient not taking: Reported on 07/23/2018 02/12/18   [name], PA-C     Depression screen [facility] 2/9 07/23/2018 02/12/2018 11/21/2017 05/03/2017 10/17/2016  Decreased Interest 0 0 0 0 0  Down, Depressed, Hopeless 0 0 0 0 0  PHQ - 2 Score 0 0 0 0 0     Fall Risk  07/23/2018 02/12/2018 11/21/2017 05/03/2017 10/17/2016  Falls in the past year? - No No Yes No  Number falls in past yr: 0 - - 1 -  Injury with Fall? 0 - - Yes -      PHYSICAL EXAM: BP (!) 146/73   Ht 5' 2.5" (1.588 m)   Wt 156 lb 6.4 oz (70.9 kg)   BMI 28.15 kg/m    Wt Readings from Last 3 Encounters:  07/23/18 156 lb 6.4 oz (70.9 kg)  02/12/18 163 lb 6.4 oz (74.1 kg)  11/21/17 161 lb 6.4 oz (73.2 kg)       Education/Counseling provided regarding diet and exercise, prevention of chronic diseases, smoking/tobacco cessation, if applicable, and reviewed "Covered Medicare Preventive Services."   ASSESSMENT/PLAN:    Two story home lives with husband No grab bars  Well light   Went to [place] 2-4 to [place] family. Raised granddaughter she is 24 now having her first child.  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   Now Retired   [employer] 2-3 miles 3 4 times a week.  No trouble getting up 30 sec watched  Patient declined immunizations Mammogram up to date  Will be calling for eye appointment  I have reviewed and agree with the above AWV documentation. Agustina Caroli, MD

## 2018-08-15 ENCOUNTER — Other Ambulatory Visit: Payer: Self-pay | Admitting: Emergency Medicine

## 2018-08-15 DIAGNOSIS — Z1231 Encounter for screening mammogram for malignant neoplasm of breast: Secondary | ICD-10-CM

## 2018-08-21 ENCOUNTER — Other Ambulatory Visit: Payer: Self-pay

## 2018-08-21 ENCOUNTER — Ambulatory Visit (INDEPENDENT_AMBULATORY_CARE_PROVIDER_SITE_OTHER): Payer: Medicare Other | Admitting: Family Medicine

## 2018-08-21 DIAGNOSIS — Z136 Encounter for screening for cardiovascular disorders: Secondary | ICD-10-CM | POA: Diagnosis not present

## 2018-08-21 DIAGNOSIS — Z0000 Encounter for general adult medical examination without abnormal findings: Secondary | ICD-10-CM | POA: Diagnosis not present

## 2018-08-21 DIAGNOSIS — R030 Elevated blood-pressure reading, without diagnosis of hypertension: Secondary | ICD-10-CM | POA: Diagnosis not present

## 2018-08-21 NOTE — Progress Notes (Signed)
Nurse visit only

## 2018-08-22 LAB — LIPID PANEL
CHOL/HDL RATIO: 3.5 ratio (ref 0.0–4.4)
CHOLESTEROL TOTAL: 184 mg/dL (ref 100–199)
HDL: 53 mg/dL (ref >39)
LDL CALC: 113 mg/dL — AB (ref 0–99)
Triglycerides: 90 mg/dL (ref 0–149)
VLDL CHOLESTEROL CAL: 18 mg/dL (ref 5–40)

## 2018-08-22 LAB — COMPREHENSIVE METABOLIC PANEL
ALBUMIN: 4 g/dL (ref 3.7–4.7)
ALT: 17 IU/L (ref 0–32)
AST: 21 IU/L (ref 0–40)
Albumin/Globulin Ratio: 1.5 (ref 1.2–2.2)
Alkaline Phosphatase: 65 IU/L (ref 39–117)
BUN / CREAT RATIO: 16 (ref 12–28)
BUN: 15 mg/dL (ref 8–27)
Bilirubin Total: 0.4 mg/dL (ref 0.0–1.2)
CO2: 22 mmol/L (ref 20–29)
CREATININE: 0.96 mg/dL (ref 0.57–1.00)
Calcium: 9.6 mg/dL (ref 8.7–10.3)
Chloride: 103 mmol/L (ref 96–106)
GFR calc Af Amer: 67 mL/min/{1.73_m2} (ref >59)
GFR, EST NON AFRICAN AMERICAN: 58 mL/min/{1.73_m2} — AB (ref >59)
GLOBULIN, TOTAL: 2.6 g/dL (ref 1.5–4.5)
GLUCOSE: 100 mg/dL — AB (ref 65–99)
Potassium: 4.5 mmol/L (ref 3.5–5.2)
SODIUM: 142 mmol/L (ref 134–144)
Total Protein: 6.6 g/dL (ref 6.0–8.5)

## 2018-08-22 LAB — TSH: TSH: 5.6 u[IU]/mL — AB (ref 0.450–4.500)

## 2018-08-25 ENCOUNTER — Other Ambulatory Visit: Payer: Self-pay

## 2018-08-25 ENCOUNTER — Telehealth: Payer: Self-pay

## 2018-08-25 ENCOUNTER — Telehealth: Payer: Self-pay | Admitting: Emergency Medicine

## 2018-08-25 NOTE — Telephone Encounter (Signed)
Monitor symptoms.  If worse go to urgent care center for further evaluation.  Thanks.

## 2018-08-25 NOTE — Telephone Encounter (Signed)
Pls advise,  Pt is having back pain and stomach pain. Says she just finished a zpak while in [place]. Thinks she may have contracted the corona virus while there in Feb. Had lots of coughing but no fever. After taking the meds she felt fine.

## 2018-08-25 NOTE — Progress Notes (Signed)
Lm for call bk

## 2018-08-25 NOTE — Telephone Encounter (Signed)
Copied from [place] [phone]. Topic: General - Call Back - No Documentation >> Aug 25, 2018  4:18 PM Sheran Luz wrote: Reason for CRM: Patient states that she was being given her lab results when phone disconnected. Advised by practice to send CRM- Not specified if [facility] triage can release results. Patient is requesting her lab results from 3/27.

## 2018-08-27 ENCOUNTER — Encounter: Payer: Medicare Other | Admitting: Emergency Medicine

## 2018-08-28 NOTE — Telephone Encounter (Signed)
Pt called back stating she does not want to go to an urgent care and would like the next available appt to see Dr. Mitchel Honour.Pt states it is too risky to be seen in an urgent care because of all of the people. Pt states her pain is not a matter of life and death and could wait until she could be seen in the office. Please advise.

## 2018-08-29 NOTE — Telephone Encounter (Signed)
Please schedule patient for an appointment.

## 2018-09-01 ENCOUNTER — Telehealth (INDEPENDENT_AMBULATORY_CARE_PROVIDER_SITE_OTHER): Payer: Medicare Other | Admitting: Emergency Medicine

## 2018-09-01 ENCOUNTER — Other Ambulatory Visit: Payer: Self-pay

## 2018-09-01 DIAGNOSIS — R109 Unspecified abdominal pain: Secondary | ICD-10-CM

## 2018-09-01 NOTE — Assessment & Plan Note (Signed)
No red flag signs or symptoms.  Clinically stable.  Pain responsive to Tylenol.  No urinary or bowel symptoms.  Will monitor symptoms and follow-up in 2 to 3 weeks if not better.  ED precautions given.

## 2018-09-01 NOTE — Progress Notes (Signed)
Telemedicine Encounter- SOAP NOTE Established Patient  This telephone encounter was conducted with the patient's (or proxy's) verbal consent via audio telecommunications: yes/no: Yes Patient was instructed to have this encounter in a suitably private space; and to only have persons present to whom they give permission to participate. In addition, patient identity was confirmed by use of name plus two identifiers (DOB and address).  I discussed the limitations, risks, security and privacy concerns of performing an evaluation and management service by telephone and the availability of in person appointments. I also discussed with the patient that there may be a patient responsible charge related to this service. The patient expressed understanding and agreed to proceed.  I spent a total of TIME; 0 MIN TO 60 MIN: 15 minutes talking with the patient or their proxy.  No chief complaint on file. Left flank pain for 2 to 3 weeks  Subjective   Julie Pacheco is a 73 y.o. female established patient. Telephone visit today for left flank pain with some radiation to the front steady for the past 2 to 3 weeks.  Started after lifting heavy wine case.  Feels better today.  No associated symptoms.  No fever or chills.  Eating and drinking well.  Denies nausea or vomiting.  No changes in bowel habits.  Denies urinary symptoms.  Denies hematuria or dysuria.  Able to ambulate well.  Pain is worse when she gets up and starts walking.  Better with lying down and resting.  Has been taking Tylenol with relief. Also had viral respiratory infection 2 months ago.  Was in [place].  Seen at a clinic and started on Z-Pak.  Got better.  Asymptomatic today. Blood results from previous visit reviewed with her. No other complaints or medical concerns today. HPI   Patient Active Problem List   Diagnosis Date Noted  . Transient hypertension 09/12/2016  . Elevated blood-pressure reading, without diagnosis of hypertension  09/12/2016  . Hypothyroidism 10/31/2015  . Vitamin D deficiency 10/31/2015  . BMI 28.0-28.9,adult 10/31/2015  . Osteopenia 12/03/2014  . 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 Insomnia 09/07/2011    Past Medical History:  Diagnosis Date  . Atypical mole 02/05/2011   mild-Right post knee  . Migraines   . Thyroid disease     Current Outpatient Medications  Medication Sig Dispense Refill  . levothyroxine (SYNTHROID, LEVOTHROID) 50 MCG tablet TAKE 1 TABLET BY MOUTH ONCE DAILY BEFORE BREAKFAST 90 tablet 0  . acetaminophen-codeine 120-12 MG/5ML solution Take 5-10 mLs by mouth at bedtime as needed. (Patient not taking: Reported on 09/01/2018) 100 mL 0  . naproxen (NAPROSYN) 500 MG tablet Take 1 tablet (500 mg total) by mouth at bedtime. (Patient not taking: Reported on 09/01/2018) 14 tablet 0  . tobramycin (TOBREX) 0.3 % ophthalmic solution Place 2 drops into the left eye every 4 (four) hours. (Patient not taking: Reported on 09/01/2018) 5 mL 0   No current facility-administered medications for this visit.     No Known Allergies  Social History   Socioeconomic History  . Marital status: Married    Spouse name: Not on file  . Number of children: Not on file  . Years of education: Not on file  . Highest education level: Not on file  Occupational History  . Not on file  Social Needs  . Financial resource strain: Not on file  . Food insecurity:    Worry: Not on file    Inability: Not on file  . Transportation needs:  Medical: Not on file    Non-medical: Not on file  Tobacco Use  . Smoking status: Former Smoker    Last attempt to quit: 12/15/1981    Years since quitting: 36.7  . Smokeless tobacco: Never Used  Substance and Sexual Activity  . Alcohol use: Not on file  . Drug use: Not on file  . Sexual activity: Not on file  Lifestyle  . Physical activity:    Days per week: Not on file    Minutes per session: Not on file  . Stress: Not on file  Relationships  . Social  connections:    Talks on phone: Not on file    Gets together: Not on file    Attends religious service: Not on file    Active member of club or organization: Not on file    Attends meetings of clubs or organizations: Not on file    Relationship status: Not on file  . Intimate partner violence:    Fear of current or ex partner: Not on file    Emotionally abused: Not on file    Physically abused: Not on file    Forced sexual activity: Not on file  Other Topics Concern  . Not on file  Social History Narrative  . Not on file    Review of Systems  Constitutional: Negative.  Negative for chills and fever.  HENT: Negative for congestion and sore throat.   Eyes: Negative.   Respiratory: Negative.  Negative for cough, hemoptysis and shortness of breath.   Cardiovascular: Negative.  Negative for chest pain and palpitations.  Gastrointestinal: Negative.  Negative for abdominal pain, diarrhea, nausea and vomiting.  Genitourinary: Positive for flank pain. Negative for dysuria, frequency, hematuria and urgency.  Musculoskeletal: Positive for back pain. Negative for myalgias and neck pain.  Skin: Negative.  Negative for rash.  Neurological: Negative.  Negative for dizziness and headaches.  Endo/Heme/Allergies: Negative.   All other systems reviewed and are negative.   Objective   Vitals as reported by the patient: None available There were no vitals filed for this visit.  There are no diagnoses linked to this encounter. Diagnoses and all orders for this visit:  Flank pain   Flank pain No red flag signs or symptoms.  Clinically stable.  Pain responsive to Tylenol.  No urinary or bowel symptoms.  Will monitor symptoms and follow-up in 2 to 3 weeks if not better.  ED precautions given.    I discussed the assessment and treatment plan with the patient. The patient was provided an opportunity to ask questions and all were answered. The patient agreed with the plan and demonstrated an  understanding of the instructions.   The patient was advised to call back or seek an in-person evaluation if the symptoms worsen or if the condition fails to improve as anticipated.  I provided 15 minutes of non-face-to-face time during this encounter.  Horald Pollen, MD  Primary Care at [facility]

## 2018-09-01 NOTE — Progress Notes (Signed)
Patient complaining of back pain radiating to the left side x 2 weeks. No problem with urinating.

## 2018-09-11 ENCOUNTER — Other Ambulatory Visit: Payer: Self-pay

## 2018-09-11 ENCOUNTER — Telehealth (INDEPENDENT_AMBULATORY_CARE_PROVIDER_SITE_OTHER): Payer: Medicare Other | Admitting: Family Medicine

## 2018-09-11 ENCOUNTER — Ambulatory Visit: Payer: Medicare Other

## 2018-09-11 DIAGNOSIS — N23 Unspecified renal colic: Secondary | ICD-10-CM

## 2018-09-11 DIAGNOSIS — R3129 Other microscopic hematuria: Secondary | ICD-10-CM | POA: Insufficient documentation

## 2018-09-11 DIAGNOSIS — E039 Hypothyroidism, unspecified: Secondary | ICD-10-CM

## 2018-09-11 LAB — POCT URINALYSIS DIP (MANUAL ENTRY)
Bilirubin, UA: NEGATIVE
Glucose, UA: NEGATIVE mg/dL
Ketones, POC UA: NEGATIVE mg/dL
Leukocytes, UA: NEGATIVE
Nitrite, UA: NEGATIVE
Protein Ur, POC: NEGATIVE mg/dL
Spec Grav, UA: 1.02 (ref 1.010–1.025)
Urobilinogen, UA: 0.2 E.U./dL
pH, UA: 5.5 (ref 5.0–8.0)

## 2018-09-11 LAB — POC MICROSCOPIC URINALYSIS (UMFC): Mucus: ABSENT

## 2018-09-11 MED ORDER — LEVOTHYROXINE SODIUM 75 MCG PO TABS: ORAL_TABLET | ORAL | 1 refills | Status: DC

## 2018-09-11 MED ORDER — SULFAMETHOXAZOLE-TRIMETHOPRIM 400-80 MG PO TABS: 1.0000 | ORAL_TABLET | Freq: Two times a day (BID) | ORAL | 0 refills | Status: DC

## 2018-09-11 NOTE — Progress Notes (Signed)
Telemedicine Encounter- SOAP NOTE Established Patient  I discussed the limitations, risks, security and privacy concerns of performing an evaluation and management service by telephone and the availability of in person appointments. I also discussed with the patient that there may be a patient responsible charge related to this service. The patient expressed understanding and agreed to proceed.  This telephone encounter was conducted with the patient's (or proxy's) verbal consent via audio telecommunications: yes Patient was instructed to have this encounter in a suitably private space; and to only have persons present to whom they give permission to participate. In addition, patient identity was confirmed by use of name plus two identifiers (DOB and address).  I spent a total of 49min talking with the patient   cc I have to drink a lot of liquids to urinate normally. Feels something is wrong with kidney or bladder. She is not urination is irregular.           Subjective   Julie Pacheco is a 73 y.o. female established patient. Telephone visit today for pain with urination.   HPI  Pt with pain with urination described as "pressure" No blood noted. Urine at times is darker color. No h/o of kidney problems-no stones or recurrent infections. Pt has experienced UTI in the distant past.  2/4-2/11-went to [place] started after that trip intermittently-pt drink water, cranberry juice and has taken tylenol 500mg  for pain.  Pt with complete hysterectomy CT completed several years ago for back pain thought to be a kidney stone   Patient Active Problem List   Diagnosis Date Noted  . Flank pain 09/01/2018  . Transient hypertension 09/12/2016  . Elevated blood-pressure reading, without diagnosis of hypertension 09/12/2016  . Hypothyroidism 10/31/2015  . Vitamin D deficiency 10/31/2015  . BMI 28.0-28.9,adult 10/31/2015  . Osteopenia 12/03/2014  . H/O pheochromocytoma 09/07/2011   . Insomnia 09/07/2011    Past Medical History:  Diagnosis Date  . Atypical mole 02/05/2011   mild-Right post knee  . Migraines   . Thyroid disease     Current Outpatient Medications  Medication Sig Dispense Refill  . levothyroxine (SYNTHROID, LEVOTHROID) 50 MCG tablet TAKE 1 TABLET BY MOUTH ONCE DAILY BEFORE BREAKFAST 90 tablet 0   No current facility-administered medications for this visit.     No Known Allergies  Social History   Socioeconomic History  . Marital status: Married    Spouse name: Not on file  . Number of children: Not on file  . Years of education: Not on file  . Highest education level: Not on file  Occupational History  . Not on file  Social Needs  . Financial resource strain: Not on file  . Food insecurity:    Worry: Not on file    Inability: Not on file  . Transportation needs:    Medical: Not on file    Non-medical: Not on file  Tobacco Use  . Smoking status: Former Smoker    Last attempt to quit: 12/15/1981    Years since quitting: 36.7  . Smokeless tobacco: Never Used  Substance and Sexual Activity  . Alcohol use: Not on file  . Drug use: Not on file  . Sexual activity: Not on file  Lifestyle  . Physical activity:    Days per week: Not on file    Minutes per session: Not on file  . Stress: Not on file  Relationships  . Social connections:    Talks on phone: Not on file  Gets together: Not on file    Attends religious service: Not on file    Active member of club or organization: Not on file    Attends meetings of clubs or organizations: Not on file    Relationship status: Not on file  . Intimate partner violence:    Fear of current or ex partner: Not on file    Emotionally abused: Not on file    Physically abused: Not on file    Forced sexual activity: Not on file  Other Topics Concern  . Not on file  Social History Narrative  . Not on file    ROS CONSTITUTIONAL: no fever, RESP: no SOB, or coughxray GI: no  Diarrhea,no  abdominal pain, no CVA tenderness GU: pain with urination, urinary frequency, no blood  ENDO: pt takes thyroid meds-recent labwork-elevated  TSH-was told she needed a higher dose-needs new refill and repeat labwork  Objective  Vitals as reported by the patient:NONE 1. Kidney pain - POCT urinalysis dipstick - POCT Microscopic Urinalysis ([facility]) No h/o kidney stones-trace blood noted with symptoms-denies back pain or abdominal pain 2. Hypothyroidism, unspecified type Increase dose of synthroid to 57mcg daily-recheck TSH in 6 weeks-pt acknowledged understanding of need for additional medication and repeat labwork - TSH; Future  3. Other microscopic hematuria Bactrim-rx-left message on mychart that meds to be sent to pharmacy  - Urine Culture Diagnoses and all orders for this visit:  Kidney pain -     POCT urinalysis dipstick -     POCT Microscopic Urinalysis ([facility])    I discussed the assessment and treatment plan with the patient. The patient was provided an opportunity to ask questions and all were answered. The patient agreed with the plan and demonstrated an understanding of the instructions.   The patient was advised to call back or seek an in-person evaluation if the symptoms worsen or if the condition fails to improve as anticipated.  I provided 20 minutes of non-face-to-face time during this encounter.  Icesis Renn Hannah Beat, MD  Primary Care at [facility] 09-11-18

## 2018-09-11 NOTE — Progress Notes (Signed)
I have to drink a lot of liquids to urinate normally. Feels something is wrong with kidney or bladder. She is not urination is irregular.     Called pt back and relayed the results to the pt per provider message. She did have tace blood in urine and medication has been sent to pharmacy.

## 2018-09-12 LAB — URINE CULTURE

## 2018-09-17 ENCOUNTER — Telehealth: Payer: Self-pay | Admitting: Emergency Medicine

## 2018-09-17 NOTE — Telephone Encounter (Signed)
Seen by Dr. Holly Bodily.

## 2018-09-17 NOTE — Telephone Encounter (Signed)
Copied from [place] [id number]. Topic: General - Other >> Sep 17, 2018  1:26 PM Burchel, Abbi R wrote: Reason for CRM:   Pt called to get results of u/a from 09/11/2018. Please call pt with results on her cell: [phone].

## 2018-09-18 NOTE — Telephone Encounter (Signed)
Please review and advise.

## 2018-09-18 NOTE — Telephone Encounter (Signed)
Pt is calling and would like the u/a results

## 2018-09-24 ENCOUNTER — Ambulatory Visit: Payer: Self-pay

## 2018-09-25 ENCOUNTER — Telehealth: Payer: Medicare Other | Admitting: Emergency Medicine

## 2018-10-29 ENCOUNTER — Emergency Department (HOSPITAL_COMMUNITY): Payer: Medicare Other

## 2018-10-29 ENCOUNTER — Other Ambulatory Visit: Payer: Self-pay

## 2018-10-29 ENCOUNTER — Emergency Department (HOSPITAL_COMMUNITY)
Admission: EM | Admit: 2018-10-29 | Discharge: 2018-10-30 | Disposition: A | Discharge status: Home / Self Care | Payer: Medicare Other | Attending: Emergency Medicine | Admitting: Emergency Medicine

## 2018-10-29 ENCOUNTER — Encounter (HOSPITAL_COMMUNITY): Payer: Self-pay | Admitting: Emergency Medicine

## 2018-10-29 DIAGNOSIS — E039 Hypothyroidism, unspecified: Secondary | ICD-10-CM | POA: Diagnosis not present

## 2018-10-29 DIAGNOSIS — Z87891 Personal history of nicotine dependence: Secondary | ICD-10-CM | POA: Diagnosis not present

## 2018-10-29 DIAGNOSIS — Z79899 Other long term (current) drug therapy: Secondary | ICD-10-CM | POA: Insufficient documentation

## 2018-10-29 DIAGNOSIS — S29001A Unspecified injury of muscle and tendon of front wall of thorax, initial encounter: Secondary | ICD-10-CM | POA: Diagnosis present

## 2018-10-29 DIAGNOSIS — R0781 Pleurodynia: Secondary | ICD-10-CM | POA: Diagnosis not present

## 2018-10-29 DIAGNOSIS — S20219A Contusion of unspecified front wall of thorax, initial encounter: Secondary | ICD-10-CM | POA: Insufficient documentation

## 2018-10-29 DIAGNOSIS — S20211A Contusion of right front wall of thorax, initial encounter: Secondary | ICD-10-CM | POA: Diagnosis not present

## 2018-10-29 DIAGNOSIS — S20212A Contusion of left front wall of thorax, initial encounter: Secondary | ICD-10-CM | POA: Diagnosis not present

## 2018-10-29 DIAGNOSIS — W010XXA Fall on same level from slipping, tripping and stumbling without subsequent striking against object, initial encounter: Secondary | ICD-10-CM | POA: Diagnosis not present

## 2018-10-29 DIAGNOSIS — Y929 Unspecified place or not applicable: Secondary | ICD-10-CM | POA: Insufficient documentation

## 2018-10-29 DIAGNOSIS — W19XXXA Unspecified fall, initial encounter: Secondary | ICD-10-CM

## 2018-10-29 DIAGNOSIS — Y999 Unspecified external cause status: Secondary | ICD-10-CM | POA: Insufficient documentation

## 2018-10-29 DIAGNOSIS — I10 Essential (primary) hypertension: Secondary | ICD-10-CM | POA: Diagnosis not present

## 2018-10-29 DIAGNOSIS — Y939 Activity, unspecified: Secondary | ICD-10-CM | POA: Diagnosis not present

## 2018-10-29 MED ORDER — HYDROCODONE-ACETAMINOPHEN 5-325 MG PO TABS: 1.0000 | ORAL_TABLET | ORAL | 0 refills | Status: DC | PRN

## 2018-10-29 NOTE — Discharge Instructions (Addendum)
Apply ice for 20-30 minutes at a time, 3-4 times a day.  Take acetaminophen and/or ibuprofen as needed for pain.  Take hydrocodone-acetaminophen at bedtime if pain is keeping you from sleeping.  Return if pain is getting worse.  It will take 2-3 months for the pain to go away completely.

## 2018-10-29 NOTE — ED Provider Notes (Signed)
[facility] EMERGENCY DEPARTMENT Provider Note   CSN: [id number] Arrival date & time: 10/29/18  2156    History   Chief Complaint Chief Complaint  Patient presents with  . Fall    HPI Julie Pacheco is a 73 y.o. female.   The history is provided by the patient.  Fall   She has history of pheochromocytoma, and comes in following a trip and fall which occurred 3 days ago.  [place]ed area to a tiled area and her shoe got stuck on the tile and she fell forward.  She is complaining of pain across her lower rib cage.  Pain seems to be getting worse.  It is worse when she moves and worse when she breathes.  She is having difficulty sleeping at night because she is unable to get comfortable.  She denies dyspnea.  She denies weakness or dizziness.  There has been no vomiting.  She denies other injury.  Past Medical History:  Diagnosis Date  . Atypical mole 02/05/2011   mild-Right post knee  . Migraines   . Thyroid disease     Patient Active Problem List   Diagnosis Date Noted  . Kidney pain 09/11/2018  . Other microscopic hematuria 09/11/2018  . Flank pain 09/01/2018  . Transient hypertension 09/12/2016  . Elevated blood-pressure reading, without diagnosis of hypertension 09/12/2016  . Hypothyroidism 10/31/2015  . Vitamin D deficiency 10/31/2015  . BMI 28.0-28.9,adult 10/31/2015  . Osteopenia 12/03/2014  . 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 Insomnia 09/07/2011    Past Surgical History:  Procedure Laterality Date  . ABDOMINAL HYSTERECTOMY       OB History   No obstetric history on file.      Home Medications    Prior to Admission medications   Medication Sig Start Date End Date Taking? Authorizing Provider  HYDROcodone-acetaminophen (NORCO/VICODIN) 5-325 MG tablet Take 1 tablet by mouth every 4 (four) hours as needed for moderate pain or severe pain. 01/30/16   Delora Fuel, MD  levothyroxine (SYNTHROID, LEVOTHROID) 75 MCG tablet  TAKE 1 TABLET BY MOUTH ONCE DAILY BEFORE BREAKFAST 09/11/18   Corum, Rex Kras, MD    Family History Family History  Problem Relation Age of Onset  . Breast cancer Maternal Aunt     Social History Social History   Tobacco Use  . Smoking status: Former Smoker    Last attempt to quit: 12/15/1981    Years since quitting: 36.8  . Smokeless tobacco: Never Used  Substance Use Topics  . Alcohol use: Not on file  . Drug use: Not on file     Allergies   Patient has no known allergies.   Review of Systems Review of Systems  All other systems reviewed and are negative.    Physical Exam Updated Vital Signs BP (!) 163/63   Pulse 67   Temp 97.9 F (36.6 C)   Resp 18   Ht 5\' 3"  (1.6 m)   Wt 69.4 kg   SpO2 97%   BMI 27.10 kg/m   Physical Exam Vitals signs and nursing note reviewed.    73 year old female, resting comfortably and in no acute distress. Vital signs are significant for elevated blood pressure. Oxygen saturation is 97%, which is normal. Head is normocephalic and atraumatic. PERRLA, EOMI. Oropharynx is clear. Neck is nontender and supple without adenopathy or JVD. Back is nontender and there is no CVA tenderness. Lungs are clear without rales, wheezes, or rhonchi.  Chest is moderately tender across the lower rib cage without any crepitus. Heart has regular rate and rhythm without murmur. Abdomen is soft, flat, with moderate tenderness across the upper abdomen in the subcostal region.  There is no rebound or guarding.  There are no masses or hepatosplenomegaly and peristalsis is normoactive. Extremities have no cyanosis or edema, full range of motion is present. Skin is warm and dry without rash. Neurologic: Mental status is normal, cranial nerves are intact, there are no motor or sensory deficits.  ED Treatments / Results   Radiology Dg Ribs Bilateral W/chest  Result Date: 10/29/2018 CLINICAL DATA:  Bilateral rib pain. EXAM: BILATERAL RIBS AND CHEST - 4+ VIEW  COMPARISON:  06/28/2016 FINDINGS: The heart size is normal. There is some blunting of the costophrenic angles bilaterally, likely suggestive of trace bilateral pleural effusions and or atelectasis. There is no pneumothorax. There is no displaced rib fracture. IMPRESSION: 1. No acute displaced fracture.  No pneumothorax. 2. Mild blunting of the left costophrenic angle favored to represent a trace left-sided pleural effusion and/or atelectasis. Electronically Signed   By: Constance Holster M.D.   On: 10/29/2018 23:01    Procedures Procedures   Medications Ordered in ED Medications - No data to display   Initial Impression / Assessment and Plan / ED Course  I have reviewed the triage vital signs and the nursing notes.  Pertinent imaging results that were available during my care of the patient were reviewed by me and considered in my medical decision making (see chart for details).  Chest wall contusion.  X-ray showed no evidence of fracture.  Although there is tenderness in the upper abdomen, she is more than 3 days following the injury and clinically doing well.  Discussion was held whether to proceed with CT scan to see if there is any evidence of liver or spleen injury.  Decision was made to not proceed since she is clinically doing well following 3 days of observation with normal vital signs.  Patient is advised to take acetaminophen and/or ibuprofen for pain, apply ice.  Given a take-home pack of hydrocodone-acetaminophen to use at bedtime so that she can sleep.  Patient also expressed concern about obtaining a an antibody test for COVID-19 since she thinks he may have had it last February.  I have offered to have blood drawn today, but she states she is seeing her PCP in a few weeks and will get that test done there.  Final Clinical Impressions(s) / ED Diagnoses   Final diagnoses:  Fall from slip, trip, or stumble, initial encounter  Contusion of chest wall, initial encounter    ED  Discharge Orders         Ordered    HYDROcodone-acetaminophen (NORCO/VICODIN) 5-325 MG tablet  Every 4 hours PRN     10/29/18 0174           Delora Fuel, MD 94/49/67 [id number]

## 2018-10-29 NOTE — ED Triage Notes (Signed)
Pt states she tripped and fell Sunday and now c/o bilateral rib pain.

## 2018-10-30 ENCOUNTER — Telehealth: Payer: Self-pay | Admitting: Emergency Medicine

## 2018-10-30 DIAGNOSIS — S20219A Contusion of unspecified front wall of thorax, initial encounter: Secondary | ICD-10-CM | POA: Diagnosis not present

## 2018-10-30 MED ORDER — LEVOTHYROXINE SODIUM 75 MCG PO TABS: ORAL_TABLET | ORAL | 0 refills | Status: DC

## 2018-10-30 NOTE — Telephone Encounter (Signed)
Pt  has appt 11/17/2018 needs bridge refill on Thyroid medicine please . FR

## 2018-10-30 NOTE — Telephone Encounter (Signed)
rx has been sent to pharmacy

## 2018-10-31 MED FILL — Hydrocodone-Acetaminophen Tab 5-325 MG: ORAL | Qty: 6 | Status: AC

## 2018-11-03 ENCOUNTER — Ambulatory Visit: Payer: Self-pay

## 2018-11-04 ENCOUNTER — Other Ambulatory Visit: Payer: Self-pay | Admitting: Family Medicine

## 2018-11-10 ENCOUNTER — Telehealth: Payer: Self-pay | Admitting: Emergency Medicine

## 2018-11-10 NOTE — Telephone Encounter (Signed)
Patient stated that her blood work was coded wrong because she received bill   Please adv

## 2018-11-11 NOTE — Telephone Encounter (Signed)
Please look into this. Thanks.

## 2018-11-17 ENCOUNTER — Ambulatory Visit: Payer: Medicare Other | Admitting: Emergency Medicine

## 2018-12-25 ENCOUNTER — Other Ambulatory Visit: Payer: Self-pay

## 2018-12-25 ENCOUNTER — Encounter: Payer: Self-pay | Admitting: Emergency Medicine

## 2018-12-25 ENCOUNTER — Ambulatory Visit (INDEPENDENT_AMBULATORY_CARE_PROVIDER_SITE_OTHER): Payer: Medicare Other | Admitting: Emergency Medicine

## 2018-12-25 VITALS — BP 145/72 | HR 60 | Temp 98.6°F | Resp 16 | Ht 62.5 in | Wt 153.6 lb

## 2018-12-25 DIAGNOSIS — S46812A Strain of other muscles, fascia and tendons at shoulder and upper arm level, left arm, initial encounter: Secondary | ICD-10-CM

## 2018-12-25 DIAGNOSIS — M62838 Other muscle spasm: Secondary | ICD-10-CM | POA: Diagnosis not present

## 2018-12-25 DIAGNOSIS — M7918 Myalgia, other site: Secondary | ICD-10-CM

## 2018-12-25 MED ORDER — TRAMADOL HCL 50 MG PO TABS: 50.0000 mg | ORAL_TABLET | Freq: Three times a day (TID) | ORAL | 0 refills | Status: AC | PRN

## 2018-12-25 MED ORDER — CYCLOBENZAPRINE HCL 10 MG PO TABS: 10.0000 mg | ORAL_TABLET | Freq: Every day | ORAL | 0 refills | Status: DC

## 2018-12-25 NOTE — Patient Instructions (Addendum)
If you have lab work done today you will be contacted with your lab results within the next 2 weeks.  If you have not heard from [place] then please contact us. The fastest way to get your results is to register for My Chart.   IF you received an x-ray today, you will receive an invoice from [facility]. Please contact [facility] at [phone] with questions or concerns regarding your invoice.   IF you received labwork today, you will receive an invoice from [place]. Please contact [facility] at [phone] with questions or concerns regarding your invoice.   Our billing staff will not be able to assist you with questions regarding bills from these companies.  You will be contacted with the lab results as soon as they are available. The fastest way to get your results is to activate your My Chart account. Instructions are located on the last page of this paperwork. If you have not heard from [place] regarding the results in 2 weeks, please contact this office.     Muscle Pain, Adult Muscle pain (myalgia) may be mild or severe. In most cases, the pain lasts only a short time and it goes away without treatment. It is normal to feel some muscle pain after starting a workout program. Muscles that have not been used often will be sore at first. Muscle pain may also be caused by many other things, including:  Overuse or muscle strain, especially if you are not in shape. This is the most common cause of muscle pain.  Injury.  Bruises.  Viruses, such as the flu.  Infectious diseases.  A chronic condition that causes muscle tenderness, fatigue, and headache (fibromyalgia).  A condition, such as lupus, in which the body's disease-fighting system attacks other organs in the body (autoimmune or rheumatologic diseases).  Certain drugs, including ACE inhibitors and statins. To diagnose the cause of your muscle pain, your health care provider will do a physical exam and ask  questions about the pain and when it began. If you have not had muscle pain for very long, your health care provider may want to wait before doing much testing. If your muscle pain has lasted a long time, your health care provider may want to run tests right away. In some cases, this may include tests to rule out certain conditions or illnesses. Treatment for muscle pain depends on the cause. Home care is often enough to relieve muscle pain. Your health care provider may also prescribe anti-inflammatory medicine. Follow these instructions at home: Activity  If overuse is causing your muscle pain: ? Slow down your activities until the pain goes away. ? Do regular, gentle exercises if you are not usually active. ? Warm up before exercising. Stretch before and after exercising. This can help lower the risk of muscle pain.  Do not continue working out if the pain is very bad. Bad pain could mean that you have injured a muscle. Managing pain and discomfort   If directed, apply ice to the sore muscle: ? Put ice in a plastic bag. ? Place a towel between your skin and the bag. ? Leave the ice on for 20 minutes, 2-3 times a day.  You may also alternate between applying ice and applying heat as told by your health care provider. To apply heat, use the heat source that your health care provider recommends, such as a moist heat pack or a heating pad. ? Place a towel between your skin and the  heat source. ? Leave the heat on for 20-30 minutes. ? Remove the heat if your skin turns bright red. This is especially important if you are unable to feel pain, heat, or cold. You may have a greater risk of getting burned. Medicines  Take over-the-counter and prescription medicines only as told by your health care provider.  Do not drive or use heavy machinery while taking prescription pain medicine. Contact a health care provider if:  Your muscle pain gets worse and medicines do not help.  You have muscle  pain that lasts longer than 3 days.  You have a rash or fever along with muscle pain.  You have muscle pain after a tick bite.  You have muscle pain while working out, even though you are in good physical condition.  You have redness, soreness, or swelling along with muscle pain.  You have muscle pain after starting a new medicine or changing the dose of a medicine. Get help right away if:  You have trouble breathing.  You have trouble swallowing.  You have muscle pain along with a stiff neck, fever, and vomiting.  You have severe muscle weakness or cannot move part of your body. This information is not intended to replace advice given to you by your health care provider. Make sure you discuss any questions you have with your health care provider. Document Released: 04/05/2006 Document Revised: 04/26/2017 Document Reviewed: 10/04/2015 Elsevier Patient Education  2020 [employer].

## 2018-12-25 NOTE — Progress Notes (Signed)
Julie Pacheco 73 y.o.   Chief Complaint  Patient presents with  . Shoulder Pain    x 1 week - LEFT radiate to upper back    HISTORY OF PRESENT ILLNESS:  This is a 73 y.o. female complaining of left upper back pain for 1 week.  Denies injury.  Denies neurological symptoms to left arm.  Denies chest pain or difficulty breathing.  Denies fever or chills.  Denies flulike symptoms.  No other significant symptoms.  Took some pain medication from husband with some relief.  HPI   Prior to Admission medications   Medication Sig Start Date End Date Taking? Authorizing Provider  EUTHYROX 75 MCG tablet TAKE 1 TABLET BY MOUTH ONCE DAILY BEFORE BREAKFAST 11/05/18  Yes Corum, Rex Kras, MD  HYDROcodone-acetaminophen (NORCO/VICODIN) 5-325 MG tablet Take 1 tablet by mouth every 4 (four) hours as needed for moderate pain or severe pain. Patient not taking: Reported on 10/12/6158 [birth year]   Delora Fuel, MD    No Known Allergies  Patient Active Problem List   Diagnosis Date Noted  . Kidney pain 09/11/2018  . Other microscopic hematuria 09/11/2018  . Flank pain 09/01/2018  . Transient hypertension 09/12/2016  . Elevated blood-pressure reading, without diagnosis of hypertension 09/12/2016  . Hypothyroidism 10/31/2015  . Vitamin D deficiency 10/31/2015  . BMI 28.0-28.9,adult 10/31/2015  . Osteopenia 12/03/2014  . 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 Insomnia 09/07/2011    Past Medical History:  Diagnosis Date  . Atypical mole 02/05/2011   mild-Right post knee  . Migraines   . Thyroid disease     Past Surgical History:  Procedure Laterality Date  . ABDOMINAL HYSTERECTOMY      Social History   Socioeconomic History  . Marital status: Married    Spouse name: Not on file  . Number of children: Not on file  . Years of education: Not on file  . Highest education level: Not on file  Occupational History  . Not on file  Social Needs  . Financial resource strain: Not on file  . Food  insecurity    Worry: Not on file    Inability: Not on file  . Transportation needs    Medical: Not on file    Non-medical: Not on file  Tobacco Use  . Smoking status: Former Smoker    Quit date: 12/15/1981    Years since quitting: 37.0  . Smokeless tobacco: Never Used  Substance and Sexual Activity  . Alcohol use: Not on file  . Drug use: Not on file  . Sexual activity: Not on file  Lifestyle  . Physical activity    Days per week: Not on file    Minutes per session: Not on file  . Stress: Not on file  Relationships  . Social Herbalist on phone: Not on file    Gets together: Not on file    Attends religious service: Not on file    Active member of club or organization: Not on file    Attends meetings of clubs or organizations: Not on file    Relationship status: Not on file  . Intimate partner violence    Fear of current or ex partner: Not on file    Emotionally abused: Not on file    Physically abused: Not on file    Forced sexual activity: Not on file  Other Topics Concern  . Not on file  Social History Narrative  . Not on file    Family  History  Problem Relation Age of Onset  . Breast cancer Maternal Aunt      Review of Systems  Constitutional: Negative.  Negative for chills and fever.  HENT: Negative.   Respiratory: Negative.  Negative for cough and shortness of breath.   Cardiovascular: Negative.  Negative for chest pain and palpitations.  Gastrointestinal: Negative.  Negative for abdominal pain, diarrhea, nausea and vomiting.  Genitourinary: Negative.  Negative for dysuria.  Musculoskeletal: Positive for back pain.  Skin: Negative.  Negative for rash.  Neurological: Negative for dizziness and headaches.  All other systems reviewed and are negative.  Vitals:   12/25/18 1123  BP: (!) 145/72  Pulse: 60  Resp: 16  Temp: 98.6 F (37 C)  SpO2: 97%     Physical Exam Vitals signs reviewed.  Constitutional:      Appearance: Normal  appearance.  HENT:     Head: Normocephalic and atraumatic.     Nose: Nose normal.  Eyes:     Extraocular Movements: Extraocular movements intact.     Conjunctiva/sclera: Conjunctivae normal.     Pupils: Pupils are equal, round, and reactive to light.  Neck:     Musculoskeletal: Normal range of motion and neck supple.  Cardiovascular:     Rate and Rhythm: Normal rate and regular rhythm.     Pulses: Normal pulses.     Heart sounds: Normal heart sounds.  Pulmonary:     Effort: Pulmonary effort is normal.     Breath sounds: Normal breath sounds.  Musculoskeletal:     Comments: Positive tenderness with muscle spasm to left trapezius muscle.  Full range of motion on left shoulder, left elbow and left wrist.  Skin:    General: Skin is warm and dry.  Neurological:     General: No focal deficit present.     Mental Status: She is alert and oriented to person, place, and time.     Sensory: No sensory deficit.     Motor: No weakness.     Deep Tendon Reflexes: Reflexes normal.  Psychiatric:        Mood and Affect: Mood normal.        Behavior: Behavior normal.      ASSESSMENT & PLAN: Michelene was seen today for shoulder pain.  Diagnoses and all orders for this visit:  Strain of left trapezius muscle, initial encounter  Muscle spasm -     cyclobenzaprine (FLEXERIL) 10 MG tablet; Take 1 tablet (10 mg total) by mouth at bedtime.  Musculoskeletal pain -     traMADol (ULTRAM) 50 MG tablet; Take 1 tablet (50 mg total) by mouth every 8 (eight) hours as needed for up to 5 days for severe pain.    Patient Instructions       If you have lab work done today you will be contacted with your lab results within the next 2 weeks.  If you have not heard from [place] then please contact us. The fastest way to get your results is to register for My Chart.   IF you received an x-ray today, you will receive an invoice from [facility]. Please contact [facility] at [phone]  with questions or concerns regarding your invoice.   IF you received labwork today, you will receive an invoice from [facility]. Please contact [facility] at [phone] with questions or concerns regarding your invoice.   Our billing staff will not be able to assist you with questions regarding bills from these companies.  You will be contacted  with the lab results as soon as they are available. The fastest way to get your results is to activate your My Chart account. Instructions are located on the last page of this paperwork. If you have not heard from [place] regarding the results in 2 weeks, please contact this office.     Muscle Pain, Adult Muscle pain (myalgia) may be mild or severe. In most cases, the pain lasts only a short time and it goes away without treatment. It is normal to feel some muscle pain after starting a workout program. Muscles that have not been used often will be sore at first. Muscle pain may also be caused by many other things, including:  Overuse or muscle strain, especially if you are not in shape. This is the most common cause of muscle pain.  Injury.  Bruises.  Viruses, such as the flu.  Infectious diseases.  A chronic condition that causes muscle tenderness, fatigue, and headache (fibromyalgia).  A condition, such as lupus, in which the body's disease-fighting system attacks other organs in the body (autoimmune or rheumatologic diseases).  Certain drugs, including ACE inhibitors and statins. To diagnose the cause of your muscle pain, your health care provider will do a physical exam and ask questions about the pain and when it began. If you have not had muscle pain for very long, your health care provider may want to wait before doing much testing. If your muscle pain has lasted a long time, your health care provider may want to run tests right away. In some cases, this may include tests to rule out certain conditions or illnesses. Treatment for muscle pain  depends on the cause. Home care is often enough to relieve muscle pain. Your health care provider may also prescribe anti-inflammatory medicine. Follow these instructions at home: Activity  If overuse is causing your muscle pain: ? Slow down your activities until the pain goes away. ? Do regular, gentle exercises if you are not usually active. ? Warm up before exercising. Stretch before and after exercising. This can help lower the risk of muscle pain.  Do not continue working out if the pain is very bad. Bad pain could mean that you have injured a muscle. Managing pain and discomfort   If directed, apply ice to the sore muscle: ? Put ice in a plastic bag. ? Place a towel between your skin and the bag. ? Leave the ice on for 20 minutes, 2-3 times a day.  You may also alternate between applying ice and applying heat as told by your health care provider. To apply heat, use the heat source that your health care provider recommends, such as a moist heat pack or a heating pad. ? Place a towel between your skin and the heat source. ? Leave the heat on for 20-30 minutes. ? Remove the heat if your skin turns bright red. This is especially important if you are unable to feel pain, heat, or cold. You may have a greater risk of getting burned. Medicines  Take over-the-counter and prescription medicines only as told by your health care provider.  Do not drive or use heavy machinery while taking prescription pain medicine. Contact a health care provider if:  Your muscle pain gets worse and medicines do not help.  You have muscle pain that lasts longer than 3 days.  You have a rash or fever along with muscle pain.  You have muscle pain after a tick bite.  You have muscle pain while working out,  even though you are in good physical condition.  You have redness, soreness, or swelling along with muscle pain.  You have muscle pain after starting a new medicine or changing the dose of a  medicine. Get help right away if:  You have trouble breathing.  You have trouble swallowing.  You have muscle pain along with a stiff neck, fever, and vomiting.  You have severe muscle weakness or cannot move part of your body. This information is not intended to replace advice given to you by your health care provider. Make sure you discuss any questions you have with your health care provider. Document Released: 04/05/2006 Document Revised: 04/26/2017 Document Reviewed: 10/04/2015 Elsevier Patient Education  2020 Elsevier Inc.      Agustina Caroli, MD [facility]

## 2018-12-26 ENCOUNTER — Ambulatory Visit
Admission: RE | Admit: 2018-12-26 | Discharge: 2018-12-26 | Disposition: A | Discharge status: Home / Self Care | Payer: Medicare Other | Source: Ambulatory Visit | Attending: Emergency Medicine | Admitting: Emergency Medicine

## 2018-12-26 DIAGNOSIS — Z1231 Encounter for screening mammogram for malignant neoplasm of breast: Secondary | ICD-10-CM

## 2019-02-03 ENCOUNTER — Other Ambulatory Visit: Payer: Self-pay | Admitting: Family Medicine

## 2019-02-03 NOTE — Telephone Encounter (Signed)
Requested medication (s) are due for refill today: yes  Requested medication (s) are on the active medication list: yes  Last refill:  12/02/2018  Future visit scheduled: no  Notes to clinic:  Review for refill   Requested Prescriptions  Pending Prescriptions Disp Refills   EUTHYROX 75 MCG tablet [Pharmacy Med Name: Euthyrox 75 MCG Oral Tablet] 30 tablet 0    Sig: TAKE 1 TABLET BY MOUTH ONCE DAILY BEFORE BREAKFAST     Endocrinology:  Hypothyroid Agents Failed - 02/03/2019  2:09 PM      Failed - TSH needs to be rechecked within 3 months after an abnormal result. Refill until TSH is due.      Failed - TSH in normal range and within 360 days    TSH  Date Value Ref Range Status  08/21/2018 5.600 (H) 0.450 - 4.500 uIU/mL Final         Passed - Valid encounter within last 12 months    Recent Outpatient Visits          1 month ago Strain of left trapezius muscle, initial encounter   [facility], Ines Bloomer, MD   4 months ago Kidney pain   [facility] at [place], [place]   5 months ago Flank pain   [facility] at [facility], Ines Bloomer, MD   5 months ago Health maintenance examination   [facility] at [facility], Ranell Patrick, MD   6 months ago Medicare annual wellness visit, subsequent   [facility] at [facility], [place], MD

## 2019-02-18 ENCOUNTER — Other Ambulatory Visit: Payer: Self-pay

## 2019-02-18 ENCOUNTER — Encounter: Payer: Self-pay | Admitting: Emergency Medicine

## 2019-02-18 ENCOUNTER — Ambulatory Visit (INDEPENDENT_AMBULATORY_CARE_PROVIDER_SITE_OTHER): Payer: Medicare Other

## 2019-02-18 ENCOUNTER — Ambulatory Visit (INDEPENDENT_AMBULATORY_CARE_PROVIDER_SITE_OTHER): Payer: Medicare Other | Admitting: Emergency Medicine

## 2019-02-18 VITALS — BP 153/71 | HR 53 | Temp 98.9°F | Resp 16 | Ht 62.0 in | Wt 154.0 lb

## 2019-02-18 DIAGNOSIS — M545 Low back pain, unspecified: Secondary | ICD-10-CM

## 2019-02-18 DIAGNOSIS — M549 Dorsalgia, unspecified: Secondary | ICD-10-CM | POA: Diagnosis not present

## 2019-02-18 DIAGNOSIS — M7918 Myalgia, other site: Secondary | ICD-10-CM

## 2019-02-18 DIAGNOSIS — M47817 Spondylosis without myelopathy or radiculopathy, lumbosacral region: Secondary | ICD-10-CM | POA: Diagnosis not present

## 2019-02-18 LAB — POCT URINALYSIS DIP (MANUAL ENTRY)
Bilirubin, UA: NEGATIVE
Glucose, UA: NEGATIVE mg/dL
Ketones, POC UA: NEGATIVE mg/dL
Leukocytes, UA: NEGATIVE
Nitrite, UA: NEGATIVE
Protein Ur, POC: NEGATIVE mg/dL
Spec Grav, UA: 1.02 (ref 1.010–1.025)
Urobilinogen, UA: 0.2 E.U./dL
pH, UA: 6 (ref 5.0–8.0)

## 2019-02-18 MED ORDER — LEVOTHYROXINE SODIUM 75 MCG PO TABS: ORAL_TABLET | ORAL | 3 refills | Status: DC

## 2019-02-18 NOTE — Patient Instructions (Addendum)
   If you have lab work done today you will be contacted with your lab results within the next 2 weeks.  If you have not heard from us then please contact us. The fastest way to get your results is to register for My Chart.   IF you received an x-ray today, you will receive an invoice from [facility]. Please contact [facility] at [phone] with questions or concerns regarding your invoice.   IF you received labwork today, you will receive an invoice from [facility]. Please contact [facility] at [phone] with questions or concerns regarding your invoice.   Our billing staff will not be able to assist you with questions regarding bills from these companies.  You will be contacted with the lab results as soon as they are available. The fastest way to get your results is to activate your My Chart account. Instructions are located on the last page of this paperwork. If you have not heard from us regarding the results in 2 weeks, please contact this office.     Acute Back Pain, Adult Acute back pain is sudden and usually short-lived. It is often caused by an injury to the muscles and tissues in the back. The injury may result from:  A muscle or ligament getting overstretched or torn (strained). Ligaments are tissues that connect bones to each other. Lifting something improperly can cause a back strain.  Wear and tear (degeneration) of the spinal disks. Spinal disks are circular tissue that provides cushioning between the bones of the spine (vertebrae).  Twisting motions, such as while playing sports or doing yard work.  A hit to the back.  Arthritis. You may have a physical exam, lab tests, and imaging tests to find the cause of your pain. Acute back pain usually goes away with rest and home care. Follow these instructions at home: Managing pain, stiffness, and swelling  Take over-the-counter and prescription medicines only as told by your health care  provider.  Your health care provider may recommend applying ice during the first 24-48 hours after your pain starts. To do this: ? Put ice in a plastic bag. ? Place a towel between your skin and the bag. ? Leave the ice on for 20 minutes, 2-3 times a day.  If directed, apply heat to the affected area as often as told by your health care provider. Use the heat source that your health care provider recommends, such as a moist heat pack or a heating pad. ? Place a towel between your skin and the heat source. ? Leave the heat on for 20-30 minutes. ? Remove the heat if your skin turns bright red. This is especially important if you are unable to feel pain, heat, or cold. You have a greater risk of getting burned. Activity   Do not stay in bed. Staying in bed for more than 1-2 days can delay your recovery.  Sit up and stand up straight. Avoid leaning forward when you sit, or hunching over when you stand. ? If you work at a desk, sit close to it so you do not need to lean over. Keep your chin tucked in. Keep your neck drawn back, and keep your elbows bent at a right angle. Your arms should look like the letter "L." ? Sit high and close to the steering wheel when you drive. Add lower back (lumbar) support to your car seat, if needed.  Take short walks on even surfaces as soon as you are able. Try   to increase the length of time you walk each day.  Do not sit, drive, or stand in one place for more than 30 minutes at a time. Sitting or standing for long periods of time can put stress on your back.  Do not drive or use heavy machinery while taking prescription pain medicine.  Use proper lifting techniques. When you bend and lift, use positions that put less stress on your back: ? Bend your knees. ? Keep the load close to your body. ? Avoid twisting.  Exercise regularly as told by your health care provider. Exercising helps your back heal faster and helps prevent back injuries by keeping muscles  strong and flexible.  Work with a physical therapist to make a safe exercise program, as recommended by your health care provider. Do any exercises as told by your physical therapist. Lifestyle  Maintain a healthy weight. Extra weight puts stress on your back and makes it difficult to have good posture.  Avoid activities or situations that make you feel anxious or stressed. Stress and anxiety increase muscle tension and can make back pain worse. Learn ways to manage anxiety and stress, such as through exercise. General instructions  Sleep on a firm mattress in a comfortable position. Try lying on your side with your knees slightly bent. If you lie on your back, put a pillow under your knees.  Follow your treatment plan as told by your health care provider. This may include: ? Cognitive or behavioral therapy. ? Acupuncture or massage therapy. ? Meditation or yoga. Contact a health care provider if:  You have pain that is not relieved with rest or medicine.  You have increasing pain going down into your legs or buttocks.  Your pain does not improve after 2 weeks.  You have pain at night.  You lose weight without trying.  You have a fever or chills. Get help right away if:  You develop new bowel or bladder control problems.  You have unusual weakness or numbness in your arms or legs.  You develop nausea or vomiting.  You develop abdominal pain.  You feel faint. Summary  Acute back pain is sudden and usually short-lived.  Use proper lifting techniques. When you bend and lift, use positions that put less stress on your back.  Take over-the-counter and prescription medicines and apply heat or ice as directed by your health care provider. This information is not intended to replace advice given to you by your health care provider. Make sure you discuss any questions you have with your health care provider. Document Released: 05/14/2005 Document Revised: 09/02/2018 Document  Reviewed: 12/26/2016 Elsevier Patient Education  2020 [employer] Inc.  

## 2019-02-18 NOTE — Progress Notes (Signed)
Julie Pacheco 73 y.o.   Chief Complaint  Patient presents with  . Back Pain    lower   x 2 month, per patient not all the time, denies urinary frequency or pain    HISTORY OF PRESENT ILLNESS: This is a 73 y.o. female complaining of low back pain since last June after a fall 10/29/2018.  Slipped and fell forward sustaining significant injury to her abdomen and chest areas.  Was seen in the emergency room and cleared.  Chest and rib x-rays unremarkable.  Denies urinary symptoms or hematuria.  Pain is localized to lumbar area, constant and sharp with no radiation or associated symptoms.  Worse with movement and better with rest.  Denies bowel or bladder issues.  No other significant symptoms.  HPI   Prior to Admission medications   Medication Sig Start Date End Date Taking? Authorizing Provider  EUTHYROX 75 MCG tablet TAKE 1 TABLET BY MOUTH ONCE DAILY BEFORE BREAKFAST 02/04/19  Yes Dionna Wiedemann, Ines Bloomer, MD  cyclobenzaprine (FLEXERIL) 10 MG tablet Take 1 tablet (10 mg total) by mouth at bedtime. Patient not taking: Reported on 02/18/2019 12/25/18   Horald Pollen, MD  HYDROcodone-acetaminophen (NORCO/VICODIN) 5-325 MG tablet Take 1 tablet by mouth every 4 (four) hours as needed for moderate pain or severe pain. Patient not taking: Reported on [id number]   Delora Fuel, MD    No Known Allergies  Patient Active Problem List   Diagnosis Date Noted  . Other microscopic hematuria 09/11/2018  . Transient hypertension 09/12/2016  . Hypothyroidism 10/31/2015  . Vitamin D deficiency 10/31/2015  . BMI 28.0-28.9,adult 10/31/2015  . Osteopenia 12/03/2014  . 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 Insomnia 09/07/2011    Past Medical History:  Diagnosis Date  . Atypical mole 02/05/2011   mild-Right post knee  . Migraines   . Thyroid disease     Past Surgical History:  Procedure Laterality Date  . ABDOMINAL HYSTERECTOMY      Social History   Socioeconomic History  . Marital  status: Married    Spouse name: Not on file  . Number of children: Not on file  . Years of education: Not on file  . Highest education level: Not on file  Occupational History  . Not on file  Social Needs  . Financial resource strain: Not on file  . Food insecurity    Worry: Not on file    Inability: Not on file  . Transportation needs    Medical: Not on file    Non-medical: Not on file  Tobacco Use  . Smoking status: Former Smoker    Quit date: 12/15/1981    Years since quitting: 37.2  . Smokeless tobacco: Never Used  Substance and Sexual Activity  . Alcohol use: Not on file  . Drug use: Not on file  . Sexual activity: Not on file  Lifestyle  . Physical activity    Days per week: Not on file    Minutes per session: Not on file  . Stress: Not on file  Relationships  . Social Herbalist on phone: Not on file    Gets together: Not on file    Attends religious service: Not on file    Active member of club or organization: Not on file    Attends meetings of clubs or organizations: Not on file    Relationship status: Not on file  . Intimate partner violence    Fear of current or ex partner: Not on  file    Emotionally abused: Not on file    Physically abused: Not on file    Forced sexual activity: Not on file  Other Topics Concern  . Not on file  Social History Narrative  . Not on file    Family History  Problem Relation Age of Onset  . Breast cancer Maternal Aunt      Review of Systems  Constitutional: Negative.  Negative for chills and fever.  HENT: Negative.  Negative for congestion and sore throat.   Respiratory: Negative.  Negative for cough and shortness of breath.   Cardiovascular: Negative.  Negative for chest pain and leg swelling.  Gastrointestinal: Negative.  Negative for abdominal pain, blood in stool, diarrhea, nausea and vomiting.  Genitourinary: Negative.  Negative for dysuria, flank pain, frequency, hematuria and urgency.   Musculoskeletal: Positive for back pain. Negative for joint pain and neck pain.  Skin: Negative.  Negative for rash.  Neurological: Negative.  Negative for dizziness, tingling, sensory change, focal weakness and headaches.  Endo/Heme/Allergies: Negative.   All other systems reviewed and are negative.  Vitals:   02/18/19 1137  BP: (!) 153/71  Pulse: (!) 53  Resp: 16  Temp: 98.9 F (37.2 C)  SpO2: 99%     Physical Exam Vitals signs reviewed.  Constitutional:      Appearance: Normal appearance.  HENT:     Head: Normocephalic.  Eyes:     Extraocular Movements: Extraocular movements intact.     Pupils: Pupils are equal, round, and reactive to light.  Neck:     Musculoskeletal: Normal range of motion.  Cardiovascular:     Rate and Rhythm: Normal rate and regular rhythm.     Heart sounds: Normal heart sounds.  Pulmonary:     Effort: Pulmonary effort is normal.     Breath sounds: Normal breath sounds.  Abdominal:     Palpations: Abdomen is soft.     Tenderness: There is no abdominal tenderness. There is no right CVA tenderness or left CVA tenderness.  Musculoskeletal:     Lumbar back: She exhibits decreased range of motion and tenderness. She exhibits no bony tenderness and no spasm.  Skin:    General: Skin is warm and dry.     Capillary Refill: Capillary refill takes less than 2 seconds.  Neurological:     General: No focal deficit present.     Mental Status: She is alert and oriented to person, place, and time.  Psychiatric:        Mood and Affect: Mood normal.        Behavior: Behavior normal.    Dg Lumbar Spine 2-3 Views  Result Date: 02/18/2019 CLINICAL DATA:  Back pain EXAM: LUMBAR SPINE - 2-3 VIEW COMPARISON:  None. FINDINGS: No fracture or dislocation. There is diffuse osteopenia. Disc height loss and facet arthrosis is seen throughout, most notable at L5-S1. Surgical clips seen within the left upper quadrant. IMPRESSION: Lumbar spine spondylosis most notable at  L5-S1. Electronically Signed   By: Prudencio Pair M.D.   On: 02/18/2019 12:18   Results for orders placed or performed in visit on 02/18/19 (from the past 24 hour(s))  POCT urinalysis dipstick     Status: Abnormal   Collection Time: 02/18/19 11:53 AM  Result Value Ref Range   Color, UA yellow yellow   Clarity, UA cloudy (A) clear   Glucose, UA negative negative mg/dL   Bilirubin, UA negative negative   Ketones, POC UA negative negative mg/dL  Spec Grav, UA 1.020 1.010 - 1.025   Blood, UA small (A) negative   pH, UA 6.0 5.0 - 8.0   Protein Ur, POC negative negative mg/dL   Urobilinogen, UA 0.2 0.2 or 1.0 E.U./dL   Nitrite, UA Negative Negative   Leukocytes, UA Negative Negative     ASSESSMENT & PLAN: Sirat was seen today for back pain.  Diagnoses and all orders for this visit:  Lumbar pain -     POCT urinalysis dipstick -     DG Lumbar Spine 2-3 Views; Future -     Urine Culture  Musculoskeletal pain     Patient Instructions       If you have lab work done today you will be contacted with your lab results within the next 2 weeks.  If you have not heard from [place] then please contact us. The fastest way to get your results is to register for My Chart.   IF you received an x-ray today, you will receive an invoice from [facility]. Please contact [facility] at [phone] with questions or concerns regarding your invoice.   IF you received labwork today, you will receive an invoice from [facility]. Please contact [facility] at [phone] with questions or concerns regarding your invoice.   Our billing staff will not be able to assist you with questions regarding bills from these companies.  You will be contacted with the lab results as soon as they are available. The fastest way to get your results is to activate your My Chart account. Instructions are located on the last page of this paperwork. If you have not heard from [place] regarding the results in 2  weeks, please contact this office.     Acute Back Pain, Adult Acute back pain is sudden and usually short-lived. It is often caused by an injury to the muscles and tissues in the back. The injury may result from:  A muscle or ligament getting overstretched or torn (strained). Ligaments are tissues that connect bones to each other. Lifting something improperly can cause a back strain.  Wear and tear (degeneration) of the spinal disks. Spinal disks are circular tissue that provides cushioning between the bones of the spine (vertebrae).  Twisting motions, such as while playing sports or doing yard work.  A hit to the back.  Arthritis. You may have a physical exam, lab tests, and imaging tests to find the cause of your pain. Acute back pain usually goes away with rest and home care. Follow these instructions at home: Managing pain, stiffness, and swelling  Take over-the-counter and prescription medicines only as told by your health care provider.  Your health care provider may recommend applying ice during the first 24-48 hours after your pain starts. To do this: ? Put ice in a plastic bag. ? Place a towel between your skin and the bag. ? Leave the ice on for 20 minutes, 2-3 times a day.  If directed, apply heat to the affected area as often as told by your health care provider. Use the heat source that your health care provider recommends, such as a moist heat pack or a heating pad. ? Place a towel between your skin and the heat source. ? Leave the heat on for 20-30 minutes. ? Remove the heat if your skin turns bright red. This is especially important if you are unable to feel pain, heat, or cold. You have a greater risk of getting burned. Activity   Do not stay in bed.  Staying in bed for more than 1-2 days can delay your recovery.  Sit up and stand up straight. Avoid leaning forward when you sit, or hunching over when you stand. ? If you work at a desk, sit close to it so you do  not need to lean over. Keep your chin tucked in. Keep your neck drawn back, and keep your elbows bent at a right angle. Your arms should look like the letter "L." ? Sit high and close to the steering wheel when you drive. Add lower back (lumbar) support to your car seat, if needed.  Take short walks on even surfaces as soon as you are able. Try to increase the length of time you walk each day.  Do not sit, drive, or stand in one place for more than 30 minutes at a time. Sitting or standing for long periods of time can put stress on your back.  Do not drive or use heavy machinery while taking prescription pain medicine.  Use proper lifting techniques. When you bend and lift, use positions that put less stress on your back: ? Harrold your knees. ? Keep the load close to your body. ? Avoid twisting.  Exercise regularly as told by your health care provider. Exercising helps your back heal faster and helps prevent back injuries by keeping muscles strong and flexible.  Work with a physical therapist to make a safe exercise program, as recommended by your health care provider. Do any exercises as told by your physical therapist. Lifestyle  Maintain a healthy weight. Extra weight puts stress on your back and makes it difficult to have good posture.  Avoid activities or situations that make you feel anxious or stressed. Stress and anxiety increase muscle tension and can make back pain worse. Learn ways to manage anxiety and stress, such as through exercise. General instructions  Sleep on a firm mattress in a comfortable position. Try lying on your side with your knees slightly bent. If you lie on your back, put a pillow under your knees.  Follow your treatment plan as told by your health care provider. This may include: ? Cognitive or behavioral therapy. ? Acupuncture or massage therapy. ? Meditation or yoga. Contact a health care provider if:  You have pain that is not relieved with rest or  medicine.  You have increasing pain going down into your legs or buttocks.  Your pain does not improve after 2 weeks.  You have pain at night.  You lose weight without trying.  You have a fever or chills. Get help right away if:  You develop new bowel or bladder control problems.  You have unusual weakness or numbness in your arms or legs.  You develop nausea or vomiting.  You develop abdominal pain.  You feel faint. Summary  Acute back pain is sudden and usually short-lived.  Use proper lifting techniques. When you bend and lift, use positions that put less stress on your back.  Take over-the-counter and prescription medicines and apply heat or ice as directed by your health care provider. This information is not intended to replace advice given to you by your health care provider. Make sure you discuss any questions you have with your health care provider. Document Released: 05/14/2005 Document Revised: 09/02/2018 Document Reviewed: 12/26/2016 Elsevier Patient Education  2020 Elsevier Inc.      Agustina Caroli, MD [facility]

## 2019-02-19 LAB — URINE CULTURE

## 2019-05-25 DIAGNOSIS — Z20828 Contact with and (suspected) exposure to other viral communicable diseases: Secondary | ICD-10-CM | POA: Diagnosis not present

## 2019-06-26 ENCOUNTER — Ambulatory Visit: Payer: Self-pay

## 2019-07-04 ENCOUNTER — Ambulatory Visit: Payer: Medicare Other | Attending: Internal Medicine

## 2019-07-04 DIAGNOSIS — Z23 Encounter for immunization: Secondary | ICD-10-CM

## 2019-07-04 NOTE — Progress Notes (Signed)
   Covid-19 Vaccination Clinic  Name:  Julie Pacheco    MRN: [id number] DOB: September 19, 1945  07/04/2019  [name] was observed post Covid-19 immunization for 15 minutes without incidence. She was provided with Vaccine Information Sheet and instruction to access the V-Safe system.   [name] was instructed to call 911 with any severe reactions post vaccine: Marland Kitchen Difficulty breathing  . Swelling of your face and throat  . A fast heartbeat  . A bad rash all over your body  . Dizziness and weakness    Immunizations Administered    Name Date Dose VIS Date Route   Pfizer COVID-19 Vaccine 07/04/2019  5:16 PM 0.3 mL 05/08/2019 Intramuscular   Manufacturer: [place]   Lot: [id number]   [place]: [id number]

## 2019-07-07 ENCOUNTER — Ambulatory Visit: Payer: Self-pay

## 2019-07-29 ENCOUNTER — Ambulatory Visit: Payer: Medicare Other | Attending: Internal Medicine

## 2019-07-29 DIAGNOSIS — Z23 Encounter for immunization: Secondary | ICD-10-CM

## 2019-07-29 NOTE — Progress Notes (Signed)
   Covid-19 Vaccination Clinic  Name:  Julie Pacheco    MRN: [id number] DOB: 01-Aug-1945  07/29/2019  [name] was observed post Covid-19 immunization for 15 minutes without incident. She was provided with Vaccine Information Sheet and instruction to access the V-Safe system.   [name] was instructed to call 911 with any severe reactions post vaccine: Marland Kitchen Difficulty breathing  . Swelling of face and throat  . A fast heartbeat  . A bad rash all over body  . Dizziness and weakness   Immunizations Administered    Name Date Dose VIS Date Route   Pfizer COVID-19 Vaccine 07/29/2019  2:42 PM 0.3 mL 05/08/2019 Intramuscular   Manufacturer: [place]   Lot: [id number]   [place]: [id number]

## 2019-08-27 ENCOUNTER — Encounter: Payer: Self-pay | Admitting: Dermatology

## 2019-08-27 ENCOUNTER — Other Ambulatory Visit: Payer: Self-pay

## 2019-08-27 ENCOUNTER — Ambulatory Visit (INDEPENDENT_AMBULATORY_CARE_PROVIDER_SITE_OTHER): Payer: Medicare Other | Admitting: Dermatology

## 2019-08-27 DIAGNOSIS — L821 Other seborrheic keratosis: Secondary | ICD-10-CM

## 2019-08-27 DIAGNOSIS — L570 Actinic keratosis: Secondary | ICD-10-CM | POA: Diagnosis not present

## 2019-08-27 DIAGNOSIS — D225 Melanocytic nevi of trunk: Secondary | ICD-10-CM

## 2019-08-27 DIAGNOSIS — Z1283 Encounter for screening for malignant neoplasm of skin: Secondary | ICD-10-CM

## 2019-08-27 DIAGNOSIS — D2271 Melanocytic nevi of right lower limb, including hip: Secondary | ICD-10-CM

## 2019-08-27 DIAGNOSIS — D2272 Melanocytic nevi of left lower limb, including hip: Secondary | ICD-10-CM

## 2019-08-27 DIAGNOSIS — D229 Melanocytic nevi, unspecified: Secondary | ICD-10-CM

## 2019-08-27 NOTE — Patient Instructions (Signed)
Routine follow-up examination for Julie Pacheco.  There is no recurrence of the atypical mole on her leg.  There are no other visibly atypical moles.  She has a dozen seborrheic keratoses on the top of the scalp, back, and sternum; these require no intervention.  There is a horn-like lesion on the right wrist which represents a precancerous keratosis; this was treated with liquid nitrogen freeze.  There is no special care for this.  Additionally we talked about her annoying persistent itching on her back.  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  She will try a pramoxine-based over-the-counter lotion.  We discussed gabapentin orally should this fail, but we mutually would prefer to avoid this.  Routine follow-up in 1 year unless there are clinical changes.

## 2019-08-29 NOTE — Progress Notes (Addendum)
   Follow-Up Visit   Subjective  Julie Pacheco is a 74 y.o. female who presents for the following: Annual Exam (no concerns).  growth Location: back Duration: 1+ year Quality: Increased number Associated Signs/Symptoms: Modifying Factors:  Severity:  Timing: Context: History of dysplastic nevus  The following portions of the chart were reviewed this encounter and updated as appropriate:     Objective  Well appearing patient in no apparent distress; mood and affect are within normal limits.  A full examination was performed including scalp, head, eyes, ears, nose, lips, neck, chest, axillae, abdomen, back, buttocks, bilateral upper extremities, bilateral lower extremities, hands, feet, fingers, toes, fingernails, and toenails. All findings within normal limits unless otherwise noted below. Routine follow-up examination for [name].  There is no recurrence of the atypical mole on her leg.  There are no other visibly atypical moles.  She has a dozen seborrheic keratoses on the top of the scalp, back, and sternum; these require no intervention.  There is a horn-like lesion on the right wrist which represents a precancerous keratosis; this was treated with liquid nitrogen freeze.  There is no special care for this.  Additionally we talked about her annoying persistent itching on her back.  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  She will try a pramoxine-based over-the-counter lotion.  We discussed gabapentin orally should this fail, but we mutually would prefer to avoid this.  Routine follow-up in 1 year unless there are clinical changes.  Assessment & Plan  AK (actinic keratosis) Right Forearm - Posterior  Destruction of lesion - Right Forearm - Posterior  Destruction method: cryotherapy   Informed consent: discussed and consent obtained   Lesion destroyed using liquid nitrogen: Yes   Region frozen until ice ball  extended beyond lesion: Yes   Outcome: patient tolerated procedure well with no complications    Nevus (5) Left Lower Leg - Anterior; Right Lower Leg - Anterior; Left Flank; Right Lower Back; Mid Back  Continue observation  Seborrheic keratosis (2) Left Upper Back; Right Upper Back

## 2019-09-15 DIAGNOSIS — H5203 Hypermetropia, bilateral: Secondary | ICD-10-CM | POA: Diagnosis not present

## 2019-09-15 DIAGNOSIS — H524 Presbyopia: Secondary | ICD-10-CM | POA: Diagnosis not present

## 2019-09-15 DIAGNOSIS — H2513 Age-related nuclear cataract, bilateral: Secondary | ICD-10-CM | POA: Diagnosis not present

## 2019-09-15 DIAGNOSIS — H52223 Regular astigmatism, bilateral: Secondary | ICD-10-CM | POA: Diagnosis not present

## 2019-09-22 ENCOUNTER — Other Ambulatory Visit: Payer: Self-pay | Admitting: *Deleted

## 2019-09-22 DIAGNOSIS — E039 Hypothyroidism, unspecified: Secondary | ICD-10-CM

## 2019-09-22 DIAGNOSIS — Z13220 Encounter for screening for lipoid disorders: Secondary | ICD-10-CM

## 2019-09-25 ENCOUNTER — Ambulatory Visit (INDEPENDENT_AMBULATORY_CARE_PROVIDER_SITE_OTHER): Payer: Medicare Other | Admitting: Family Medicine

## 2019-09-25 VITALS — BP 153/71 | Ht 62.0 in | Wt 154.0 lb

## 2019-09-25 DIAGNOSIS — Z0000 Encounter for general adult medical examination without abnormal findings: Secondary | ICD-10-CM

## 2019-09-25 NOTE — Progress Notes (Signed)
Presents today for [employer] Visit   Date of last exam: 02/18/2019  Interpreter used for this visit?   No  I connected with  Julie Pacheco on 09/25/19 by telephone  and verified that I am speaking with the correct person using two identifiers.   I discussed the limitations of evaluation and management by telemedicine. The patient expressed understanding and agreed to proceed.    Patient Care Team: Horald Pollen, MD as PCP - General (Internal Medicine) Lavonna Monarch, MD as Consulting Physician (Dermatology)   Other items to address today:   Discussed Eye/Dental Discussed immunizations    Other Screening: Last screening for diabetes: 08-21-2018 Last lipid screening: 08-21-2018  ADVANCE DIRECTIVES: Discussed: yes On File:no Materials Provided: yes  Immunization status:  Immunization History  Administered Date(s) Administered  . PFIZER SARS-COV-2 Vaccination 07/04/2019, 07/29/2019     Health Maintenance Due  Topic Date Due  . TETANUS/TDAP  Never done     Functional Status Survey: Is the patient deaf or have difficulty hearing?: No Does the patient have difficulty seeing, even when wearing glasses/contacts?: No Does the patient have difficulty concentrating, remembering, or making decisions?: No Does the patient have difficulty walking or climbing stairs?: No Does the patient have difficulty dressing or bathing?: No Does the patient have difficulty doing errands alone such as visiting a doctor's office or shopping?: No   6CIT Screen 09/25/2019 07/23/2018  What Year? 0 points 0 points  What month? 0 points 0 points  What time? 0 points 0 points  Count back from 20 0 points 0 points  Months in reverse 0 points 0 points  Repeat phrase 0 points 0 points  Total Score 0 0        Clinical Support from 09/25/2019 in [place] at [place]  AUDIT-C Score  4       Home Environment:   Two story home No trouble climbing stairs No  scattered rugs No grab bars Adequate lighting / no clutter  Patient Active Problem List   Diagnosis Date Noted  . Other microscopic hematuria 09/11/2018  . Transient hypertension 09/12/2016  . Hypothyroidism 10/31/2015  . Vitamin D deficiency 10/31/2015  . BMI 28.0-28.9,adult 10/31/2015  . Osteopenia 12/03/2014  . 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 Insomnia 09/07/2011     Past Medical History:  Diagnosis Date  . Atypical mole 02/05/2011   mild-Right post knee  . Migraines   . Thyroid disease      Past Surgical History:  Procedure Laterality Date  . ABDOMINAL HYSTERECTOMY       Family History  Problem Relation Age of Onset  . Breast cancer Maternal Aunt      Social History   Socioeconomic History  . Marital status: Married    Spouse name: Not on file  . Number of children: Not on file  . Years of education: Not on file  . Highest education level: Not on file  Occupational History  . Not on file  Tobacco Use  . Smoking status: Former Smoker    Quit date: 12/15/1981    Years since quitting: 37.8  . Smokeless tobacco: Never Used  Substance and Sexual Activity  . Alcohol use: Yes  . Drug use: Never  . Sexual activity: Not on file  Other Topics Concern  . Not on file  Social History Narrative  . Not on file   Social Determinants of Health   Financial Resource Strain:   . Difficulty of Paying Living  Expenses:   Food Insecurity:   . Worried About Charity fundraiser in the Last Year:   . Arboriculturist in the Last Year:   Transportation Needs:   . Film/video editor (Medical):   Marland Kitchen Lack of Transportation (Non-Medical):   Physical Activity:   . Days of Exercise per Week:   . Minutes of Exercise per Session:   Stress:   . Feeling of Stress :   Social Connections:   . Frequency of Communication with Friends and Family:   . Frequency of Social Gatherings with Friends and Family:   . Attends Religious Services:   . Active Member of Clubs or  Organizations:   . Attends Archivist Meetings:   Marland Kitchen Marital Status:   Intimate Partner Violence:   . Fear of Current or Ex-Partner:   . Emotionally Abused:   Marland Kitchen Physically Abused:   . Sexually Abused:      No Known Allergies   Prior to Admission medications   Medication Sig Start Date End Date Taking? Authorizing Provider  levothyroxine (EUTHYROX) 75 MCG tablet TAKE 1 TABLET BY MOUTH ONCE DAILY BEFORE BREAKFAST 02/18/19  Yes Horald Pollen, MD     Depression screen [facility] 2/9 09/25/2019 02/18/2019 12/25/2018 09/11/2018 09/01/2018  Decreased Interest 0 0 0 0 0  Down, Depressed, Hopeless 0 0 0 0 0  PHQ - 2 Score 0 0 0 0 0     Fall Risk  09/25/2019 02/18/2019 12/25/2018 09/11/2018 09/01/2018  Falls in the past year? 1 1 1  0 0  Number falls in past yr: 0 0 0 0 -  Injury with Fall? 1 0 1 0 -  [place] walking from carpet to floor/ no broken bones sore - per patient to the stomach area - -  Follow up Falls evaluation completed;Education provided - Falls evaluation completed - -      PHYSICAL EXAM: There were no vitals taken for this visit.   Wt Readings from Last 3 Encounters:  02/18/19 154 lb (69.9 kg)  12/25/18 153 lb 9.6 oz (69.7 kg)  10/29/18 153 lb (69.4 kg)       Education/Counseling provided regarding diet and exercise, prevention of chronic diseases, smoking/tobacco cessation, if applicable, and reviewed "Covered Medicare Preventive Services."

## 2019-09-25 NOTE — Patient Instructions (Addendum)
Thank you for taking time to come for your Medicare Wellness Visit. I appreciate your ongoing commitment to your health goals. Please review the following plan we discussed and let me know if I can assist you in the future.  Julie Greer LPN  Preventive Care 76 Years and Older, Female Preventive care refers to lifestyle choices and visits with your health care provider that can promote health and wellness. This includes:  A yearly physical exam. This is also called an annual well check.  Regular dental and eye exams.  Immunizations.  Screening for certain conditions.  Healthy lifestyle choices, such as diet and exercise. What can I expect for my preventive care visit? Physical exam Your health care provider will check:  Height and weight. These may be used to calculate body mass index (BMI), which is a measurement that tells if you are at a healthy weight.  Heart rate and blood pressure.  Your skin for abnormal spots. Counseling Your health care provider may ask you questions about:  Alcohol, tobacco, and drug use.  Emotional well-being.  Home and relationship well-being.  Sexual activity.  Eating habits.  History of falls.  Memory and ability to understand (cognition).  Work and work environment.  Pregnancy and menstrual history. What immunizations do I need?  Influenza (flu) vaccine  This is recommended every year. Tetanus, diphtheria, and pertussis (Tdap) vaccine  You may need a Td booster every 10 years. Varicella (chickenpox) vaccine  You may need this vaccine if you have not already been vaccinated. Zoster (shingles) vaccine  You may need this after age 60. Pneumococcal conjugate (PCV13) vaccine  One dose is recommended after age 76. Pneumococcal polysaccharide (PPSV23) vaccine  One dose is recommended after age 76. Measles, mumps, and rubella (MMR) vaccine  You may need at least one dose of MMR if you were born in 1957 or later. You may also  need a second dose. Meningococcal conjugate (MenACWY) vaccine  You may need this if you have certain conditions. Hepatitis A vaccine  You may need this if you have certain conditions or if you travel or work in places where you may be exposed to hepatitis A. Hepatitis B vaccine  You may need this if you have certain conditions or if you travel or work in places where you may be exposed to hepatitis B. Haemophilus influenzae type b (Hib) vaccine  You may need this if you have certain conditions. You may receive vaccines as individual doses or as more than one vaccine together in one shot (combination vaccines). Talk with your health care provider about the risks and benefits of combination vaccines. What tests do I need? Blood tests  Lipid and cholesterol levels. These may be checked every 5 years, or more frequently depending on your overall health.  Hepatitis C test.  Hepatitis B test. Screening  Lung cancer screening. You may have this screening every year starting at age 55 if you have a 30-pack-year history of smoking and currently smoke or have quit within the past 15 years.  Colorectal cancer screening. All adults should have this screening starting at age 76 and continuing until age 76. Your health care provider may recommend screening at age 45 if you are at increased risk. You will have tests every 1-10 years, depending on your results and the type of screening test.  Diabetes screening. This is done by checking your blood sugar (glucose) after you have not eaten for a while (fasting). You may have this done every 1-3   years.  Mammogram. This may be done every 1-2 years. Talk with your health care provider about how often you should have regular mammograms.  BRCA-related cancer screening. This may be done if you have a family history of breast, ovarian, tubal, or peritoneal cancers. Other tests  Sexually transmitted disease (STD) testing.  Bone density scan. This is done  to screen for osteoporosis. You may have this done starting at age 46. Follow these instructions at home: Eating and drinking  Eat a diet that includes fresh fruits and vegetables, whole grains, lean protein, and low-fat dairy products. Limit your intake of foods with high amounts of sugar, saturated fats, and salt.  Take vitamin and mineral supplements as recommended by your health care provider.  Do not drink alcohol if your health care provider tells you not to drink.  If you drink alcohol: ? Limit how much you have to 0-1 drink a day. ? Be aware of how much alcohol is in your drink. In the [place] one drink equals one 12 oz bottle of beer (355 mL), one 5 oz glass of wine (148 mL), or one 1 oz glass of hard liquor (44 mL). Lifestyle  Take daily care of your teeth and gums.  Stay active. Exercise for at least 30 minutes on 5 or more days each week.  Do not use any products that contain nicotine or tobacco, such as cigarettes, e-cigarettes, and chewing tobacco. If you need help quitting, ask your health care provider.  If you are sexually active, practice safe sex. Use a condom or other form of protection in order to prevent STIs (sexually transmitted infections).  Talk with your health care provider about taking a low-dose aspirin or statin. What's next?  Go to your health care provider once a year for a well check visit.  Ask your health care provider how often you should have your eyes and teeth checked.  Stay up to date on all vaccines. This information is not intended to replace advice given to you by your health care provider. Make sure you discuss any questions you have with your health care provider. Document Revised: 05/08/2018 Document Reviewed: 05/08/2018 Elsevier Patient Education  2020 [employer].

## 2019-09-30 ENCOUNTER — Ambulatory Visit (INDEPENDENT_AMBULATORY_CARE_PROVIDER_SITE_OTHER): Payer: Medicare Other | Admitting: Emergency Medicine

## 2019-09-30 ENCOUNTER — Other Ambulatory Visit: Payer: Self-pay

## 2019-09-30 DIAGNOSIS — Z13220 Encounter for screening for lipoid disorders: Secondary | ICD-10-CM

## 2019-09-30 DIAGNOSIS — E039 Hypothyroidism, unspecified: Secondary | ICD-10-CM

## 2019-10-01 LAB — COMPREHENSIVE METABOLIC PANEL
ALT: 17 IU/L (ref 0–32)
AST: 17 IU/L (ref 0–40)
Albumin/Globulin Ratio: 1.5 (ref 1.2–2.2)
Albumin: 4.1 g/dL (ref 3.7–4.7)
Alkaline Phosphatase: 80 IU/L (ref 39–117)
BUN/Creatinine Ratio: 12 (ref 12–28)
BUN: 9 mg/dL (ref 8–27)
Bilirubin Total: 0.6 mg/dL (ref 0.0–1.2)
CO2: 22 mmol/L (ref 20–29)
Calcium: 9.2 mg/dL (ref 8.7–10.3)
Chloride: 107 mmol/L — ABNORMAL HIGH (ref 96–106)
Creatinine, Ser: 0.78 mg/dL (ref 0.57–1.00)
GFR calc Af Amer: 86 mL/min/{1.73_m2} (ref >59)
GFR calc non Af Amer: 75 mL/min/{1.73_m2} (ref >59)
Globulin, Total: 2.8 g/dL (ref 1.5–4.5)
Glucose: 101 mg/dL — ABNORMAL HIGH (ref 65–99)
Potassium: 4.6 mmol/L (ref 3.5–5.2)
Sodium: 142 mmol/L (ref 134–144)
Total Protein: 6.9 g/dL (ref 6.0–8.5)

## 2019-10-01 LAB — LIPID PANEL
Chol/HDL Ratio: 3.1 ratio (ref 0.0–4.4)
Cholesterol, Total: 191 mg/dL (ref 100–199)
HDL: 62 mg/dL (ref >39)
LDL Chol Calc (NIH): 114 mg/dL — ABNORMAL HIGH (ref 0–99)
Triglycerides: 81 mg/dL (ref 0–149)
VLDL Cholesterol Cal: 15 mg/dL (ref 5–40)

## 2019-10-01 LAB — TSH: TSH: 0.043 u[IU]/mL — ABNORMAL LOW (ref 0.450–4.500)

## 2019-10-05 ENCOUNTER — Ambulatory Visit (INDEPENDENT_AMBULATORY_CARE_PROVIDER_SITE_OTHER): Payer: Medicare Other | Admitting: Emergency Medicine

## 2019-10-05 ENCOUNTER — Encounter: Payer: Self-pay | Admitting: Emergency Medicine

## 2019-10-05 ENCOUNTER — Other Ambulatory Visit: Payer: Self-pay

## 2019-10-05 VITALS — BP 143/73 | HR 53 | Temp 98.0°F | Resp 16 | Ht 62.0 in | Wt 151.0 lb

## 2019-10-05 DIAGNOSIS — E039 Hypothyroidism, unspecified: Secondary | ICD-10-CM | POA: Diagnosis not present

## 2019-10-05 DIAGNOSIS — E785 Hyperlipidemia, unspecified: Secondary | ICD-10-CM

## 2019-10-05 MED ORDER — ROSUVASTATIN CALCIUM 10 MG PO TABS: 10.0000 mg | ORAL_TABLET | Freq: Every day | ORAL | 3 refills | Status: DC

## 2019-10-05 NOTE — Assessment & Plan Note (Signed)
Based on 10-year cardiovascular risk score, 19.5%, patient may benefit from statin therapy.  Will start Crestor 10 mg daily.

## 2019-10-05 NOTE — Progress Notes (Addendum)
Julie Pacheco 74 y.o.   Chief Complaint  Patient presents with  . Hyperlipidemia    per pt to review labs    HISTORY OF PRESENT ILLNESS: This is a 74 y.o. female with history of hypothyroidism.  Had blood work done last week.  Here for follow-up. Has no complaints or medical concerns.  Takes Synthroid 75 mcg daily.  Doing well. Recent Results (from the past 2160 hour(s))  Comprehensive metabolic panel     Status: Abnormal   Collection Time: 09/30/19 10:16 AM  Result Value Ref Range   Glucose 101 (H) 65 - 99 mg/dL   BUN 9 8 - 27 mg/dL   Creatinine, Ser 0.78 0.57 - 1.00 mg/dL   GFR calc non Af Amer 75 >59 mL/min/1.73   GFR calc Af Amer 86 >59 mL/min/1.73    Comment: **[facility] currently reports eGFR in compliance with the current**   recommendations of the [employer]. [facility] will   update reporting as new guidelines are published from the NKF-ASN   Task force.    BUN/Creatinine Ratio 12 12 - 28   Sodium 142 134 - 144 mmol/L   Potassium 4.6 3.5 - 5.2 mmol/L   Chloride 107 (H) 96 - 106 mmol/L   CO2 22 20 - 29 mmol/L   Calcium 9.2 8.7 - 10.3 mg/dL   Total Protein 6.9 6.0 - 8.5 g/dL   Albumin 4.1 3.7 - 4.7 g/dL   Globulin, Total 2.8 1.5 - 4.5 g/dL   Albumin/Globulin Ratio 1.5 1.2 - 2.2   Bilirubin Total 0.6 0.0 - 1.2 mg/dL   Alkaline Phosphatase 80 39 - 117 IU/L   AST 17 0 - 40 IU/L   ALT 17 0 - 32 IU/L  TSH     Status: Abnormal   Collection Time: 09/30/19 10:16 AM  Result Value Ref Range   TSH 0.043 (L) 0.450 - 4.500 uIU/mL  Lipid panel     Status: Abnormal   Collection Time: 09/30/19 10:16 AM  Result Value Ref Range   Cholesterol, Total 191 100 - 199 mg/dL   Triglycerides 81 0 - 149 mg/dL   HDL 62 >39 mg/dL   VLDL Cholesterol Cal 15 5 - 40 mg/dL   LDL Chol Calc (NIH) 114 (H) 0 - 99 mg/dL   Chol/HDL Ratio 3.1 0.0 - 4.4 ratio    Comment:                                   T. Chol/HDL Ratio                                             Men  Women                                1/2 Avg.Risk  3.4    3.3                                   Avg.Risk  5.0    4.4  2X Avg.Risk  9.6    7.1                                3X Avg.Risk 23.4   11.0    Blood results reviewed and discussed with patient in the room.  The 10-year ASCVD risk score Mikey Bussing DC Brooke Bonito., et al., 2013) is: 19.5%   Values used to calculate the score:     Age: 13 years     Sex: Female     Is Non-Hispanic African American: No     Diabetic: No     Tobacco smoker: No     Systolic Blood Pressure: 536 mmHg     Is BP treated: No     HDL Cholesterol: 62 mg/dL     Total Cholesterol: 191 mg/dL   HPI   Prior to Admission medications   Medication Sig Start Date End Date Taking? Authorizing Provider  levothyroxine (EUTHYROX) 75 MCG tablet TAKE 1 TABLET BY MOUTH ONCE DAILY BEFORE BREAKFAST 02/18/19  Yes Allen Egerton, Ines Bloomer, MD    No Known Allergies  Patient Active Problem List   Diagnosis Date Noted  . Other microscopic hematuria 09/11/2018  . Transient hypertension 09/12/2016  . Hypothyroidism 10/31/2015  . Vitamin D deficiency 10/31/2015  . BMI 28.0-28.9,adult 10/31/2015  . Osteopenia 12/03/2014  . 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 Insomnia 09/07/2011    Past Medical History:  Diagnosis Date  . Atypical mole 02/05/2011   mild-Right post knee  . Migraines   . Thyroid disease     Past Surgical History:  Procedure Laterality Date  . ABDOMINAL HYSTERECTOMY      Social History   Socioeconomic History  . Marital status: Married    Spouse name: Not on file  . Number of children: Not on file  . Years of education: Not on file  . Highest education level: Not on file  Occupational History  . Not on file  Tobacco Use  . Smoking status: Former Smoker    Quit date: 12/15/1981    Years since quitting: 37.8  . Smokeless tobacco: Never Used  Substance and Sexual Activity  . Alcohol use: Yes  . Drug use: Never  . Sexual activity: Not on  file  Other Topics Concern  . Not on file  Social History Narrative  . Not on file   Social Determinants of Health   Financial Resource Strain:   . Difficulty of Paying Living Expenses:   Food Insecurity:   . Worried About Charity fundraiser in the Last Year:   . Arboriculturist in the Last Year:   Transportation Needs:   . Film/video editor (Medical):   Marland Kitchen Lack of Transportation (Non-Medical):   Physical Activity:   . Days of Exercise per Week:   . Minutes of Exercise per Session:   Stress:   . Feeling of Stress :   Social Connections:   . Frequency of Communication with Friends and Family:   . Frequency of Social Gatherings with Friends and Family:   . Attends Religious Services:   . Active Member of Clubs or Organizations:   . Attends Archivist Meetings:   Marland Kitchen Marital Status:   Intimate Partner Violence:   . Fear of Current or Ex-Partner:   . Emotionally Abused:   Marland Kitchen Physically Abused:   . Sexually Abused:     Family History  Problem Relation Age of Onset  .  Breast cancer Maternal Aunt      Review of Systems  Constitutional: Negative.  Negative for fever.  HENT: Negative.  Negative for congestion and sore throat.   Respiratory: Negative.  Negative for cough and shortness of breath.   Cardiovascular: Negative.  Negative for chest pain and palpitations.  Gastrointestinal: Negative.  Negative for abdominal pain, blood in stool, diarrhea, melena, nausea and vomiting.  Genitourinary: Negative.  Negative for dysuria and hematuria.  Musculoskeletal: Negative.  Negative for back pain, myalgias and neck pain.  Skin: Negative.  Negative for rash.  Neurological: Negative.  Negative for dizziness and headaches.  Endo/Heme/Allergies: Negative.   All other systems reviewed and are negative.  Today's Vitals   10/05/19 1022  BP: (!) 143/73  Pulse: (!) 53  Resp: 16  Temp: 98 F (36.7 C)  TempSrc: Temporal  SpO2: 98%  Weight: 151 lb (68.5 kg)  Height: 5'  2" (1.575 m)   Body mass index is 27.62 kg/m.   Physical Exam Vitals reviewed.  Constitutional:      Appearance: Normal appearance.  HENT:     Head: Normocephalic.  Eyes:     Extraocular Movements: Extraocular movements intact.     Conjunctiva/sclera: Conjunctivae normal.     Pupils: Pupils are equal, round, and reactive to light.  Cardiovascular:     Rate and Rhythm: Normal rate and regular rhythm.     Pulses: Normal pulses.     Heart sounds: Normal heart sounds.  Pulmonary:     Effort: Pulmonary effort is normal.     Breath sounds: Normal breath sounds.  Musculoskeletal:        General: Normal range of motion.     Cervical back: Normal range of motion and neck supple.  Skin:    General: Skin is warm and dry.     Capillary Refill: Capillary refill takes less than 2 seconds.  Neurological:     General: No focal deficit present.     Mental Status: She is alert and oriented to person, place, and time.  Psychiatric:        Mood and Affect: Mood normal.        Behavior: Behavior normal.     A total of 30 minutes was spent with the patient, greater than 50% of which was in counseling/coordination of care regarding chronic medical problems, review of most recent blood work which shows increased LDL cholesterol, benefits of statin therapy to reduce cardiovascular risk associated with this condition, side effects, diet and nutrition, review of thyroid medication and TSH result, CMP results and improvement in GFR, health maintenance items, prognosis and need for follow-up.   ASSESSMENT & PLAN: Dyslipidemia Based on 10-year cardiovascular risk score, 19.5%, patient may benefit from statin therapy.  Will start Crestor 10 mg daily.  Chaniqua was seen today for hyperlipidemia.  Diagnoses and all orders for this visit:  Hypothyroidism, unspecified type  Dyslipidemia -     rosuvastatin (CRESTOR) 10 MG tablet; Take 1 tablet (10 mg total) by mouth daily.    Patient Instructions         If you have lab work done today you will be contacted with your lab results within the next 2 weeks.  If you have not heard from [place] then please contact us. The fastest way to get your results is to register for My Chart.   IF you received an x-ray today, you will receive an invoice from [facility]. Please contact [facility] at [phone] with questions  or concerns regarding your invoice.   IF you received labwork today, you will receive an invoice from [facility]. Please contact [facility] at [phone] with questions or concerns regarding your invoice.   Our billing staff will not be able to assist you with questions regarding bills from these companies.  You will be contacted with the lab results as soon as they are available. The fastest way to get your results is to activate your My Chart account. Instructions are located on the last page of this paperwork. If you have not heard from [place] regarding the results in 2 weeks, please contact this office.     Dyslipidemia Dyslipidemia is an imbalance of waxy, fat-like substances (lipids) in the blood. The body needs lipids in small amounts. Dyslipidemia often involves a high level of cholesterol or triglycerides, which are types of lipids. Common forms of dyslipidemia include:  High levels of LDL cholesterol. LDL is the type of cholesterol that causes fatty deposits (plaques) to build up in the blood vessels that carry blood away from your heart (arteries).  Low levels of HDL cholesterol. HDL cholesterol is the type of cholesterol that protects against heart disease. High levels of HDL remove the LDL buildup from arteries.  High levels of triglycerides. Triglycerides are a fatty substance in the blood that is linked to a buildup of plaques in the arteries. What are the causes? Primary dyslipidemia is caused by changes (mutations) in genes that are passed down through families (inherited). These mutations cause  several types of dyslipidemia. Secondary dyslipidemia is caused by lifestyle choices and diseases that lead to dyslipidemia, such as:  Eating a diet that is high in animal fat.  Not getting enough exercise.  Having diabetes, kidney disease, liver disease, or thyroid disease.  Drinking large amounts of alcohol.  Using certain medicines. What increases the risk? You are more likely to develop this condition if you are an older man or if you are a woman who has gone through menopause. Other risk factors include:  Having a family history of dyslipidemia.  Taking certain medicines, including birth control pills, steroids, some diuretics, and beta-blockers.  Smoking cigarettes.  Eating a high-fat diet.  Having certain medical conditions such as diabetes, polycystic ovary syndrome (PCOS), kidney disease, liver disease, or hypothyroidism.  Not exercising regularly.  Being overweight or obese with too much belly fat. What are the signs or symptoms? In most cases, dyslipidemia does not usually cause any symptoms. In severe cases, very high lipid levels can cause:  Fatty bumps under the skin (xanthomas).  White or gray ring around the black center (pupil) of the eye. Very high triglyceride levels can cause inflammation of the pancreas (pancreatitis). How is this diagnosed? Your health care provider may diagnose dyslipidemia based on a routine blood test (fasting blood test). Because most people do not have symptoms of the condition, this blood testing (lipid profile) is done on adults age 84 and older and is repeated every 5 years. This test checks:  Total cholesterol. This measures the total amount of cholesterol in your blood, including LDL cholesterol, HDL cholesterol, and triglycerides. A healthy number is below 200.  LDL cholesterol. The target number for LDL cholesterol is different for each person, depending on individual risk factors. Ask your health care provider what your LDL  cholesterol should be.  HDL cholesterol. An HDL level of 60 or higher is best because it helps to protect against heart disease. A number below 80 for men or below 50 for  women increases the risk for heart disease.  Triglycerides. A healthy triglyceride number is below 150. If your lipid profile is abnormal, your health care provider may do other blood tests. How is this treated? Treatment depends on the type of dyslipidemia that you have and your other risk factors for heart disease and stroke. Your health care provider will have a target range for your lipid levels based on this information. For many people, this condition may be treated by lifestyle changes, such as diet and exercise. Your health care provider may recommend that you:  Get regular exercise.  Make changes to your diet.  Quit smoking if you smoke. If diet changes and exercise do not help you reach your goals, your health care provider may also prescribe medicine to lower lipids. The most commonly prescribed type of medicine lowers your LDL cholesterol (statin drug). If you have a high triglyceride level, your provider may prescribe another type of drug (fibrate) or an omega-3 fish oil supplement, or both. Follow these instructions at home:  Eating and drinking  Follow instructions from your health care provider or dietitian about eating or drinking restrictions.  Eat a healthy diet as told by your health care provider. This can help you reach and maintain a healthy weight, lower your LDL cholesterol, and raise your HDL cholesterol. This may include: ? Limiting your calories, if you are overweight. ? Eating more fruits, vegetables, whole grains, fish, and lean meats. ? Limiting saturated fat, trans fat, and cholesterol.  If you drink alcohol: ? Limit how much you use. ? Be aware of how much alcohol is in your drink. In the [place] one drink equals one 12 oz bottle of beer (355 mL), one 5 oz glass of wine (148 mL), or one 1  oz glass of hard liquor (44 mL).  Do not drink alcohol if: ? Your health care provider tells you not to drink. ? You are pregnant, may be pregnant, or are planning to become pregnant. Activity  Get regular exercise. Start an exercise and strength training program as told by your health care provider. Ask your health care provider what activities are safe for you. Your health care provider may recommend: ? 30 minutes of aerobic activity 4-6 days a week. Brisk walking is an example of aerobic activity. ? Strength training 2 days a week. General instructions  Do not use any products that contain nicotine or tobacco, such as cigarettes, e-cigarettes, and chewing tobacco. If you need help quitting, ask your health care provider.  Take over-the-counter and prescription medicines only as told by your health care provider. This includes supplements.  Keep all follow-up visits as told by your health care provider. Contact a health care provider if:  You are: ? Having trouble sticking to your exercise or diet plan. ? Struggling to quit smoking or control your use of alcohol. Summary  Dyslipidemia often involves a high level of cholesterol or triglycerides, which are types of lipids.  Treatment depends on the type of dyslipidemia that you have and your other risk factors for heart disease and stroke.  For many people, treatment starts with lifestyle changes, such as diet and exercise.  Your health care provider may prescribe medicine to lower lipids. This information is not intended to replace advice given to you by your health care provider. Make sure you discuss any questions you have with your health care provider. Document Revised: 01/06/2018 Document Reviewed: 12/13/2017 Elsevier Patient Education  [place].  Barbarann Kelly, MD [facility] 

## 2019-10-05 NOTE — Patient Instructions (Addendum)
If you have lab work done today you will be contacted with your lab results within the next 2 weeks.  If you have not heard from [place] then please contact us. The fastest way to get your results is to register for My Chart.   IF you received an x-ray today, you will receive an invoice from [facility]. Please contact [facility]iology at [phone] with questions or concerns regarding your invoice.   IF you received labwork today, you will receive an invoice from [place]. Please contact [facility] at [phone] with questions or concerns regarding your invoice.   Our billing staff will not be able to assist you with questions regarding bills from these companies.  You will be contacted with the lab results as soon as they are available. The fastest way to get your results is to activate your My Chart account. Instructions are located on the last page of this paperwork. If you have not heard from [place] regarding the results in 2 weeks, please contact this office.     Dyslipidemia Dyslipidemia is an imbalance of waxy, fat-like substances (lipids) in the blood. The body needs lipids in small amounts. Dyslipidemia often involves a high level of cholesterol or triglycerides, which are types of lipids. Common forms of dyslipidemia include:  High levels of LDL cholesterol. LDL is the type of cholesterol that causes fatty deposits (plaques) to build up in the blood vessels that carry blood away from your heart (arteries).  Low levels of HDL cholesterol. HDL cholesterol is the type of cholesterol that protects against heart disease. High levels of HDL remove the LDL buildup from arteries.  High levels of triglycerides. Triglycerides are a fatty substance in the blood that is linked to a buildup of plaques in the arteries. What are the causes? Primary dyslipidemia is caused by changes (mutations) in genes that are passed down through families (inherited). These mutations cause several  types of dyslipidemia. Secondary dyslipidemia is caused by lifestyle choices and diseases that lead to dyslipidemia, such as:  Eating a diet that is high in animal fat.  Not getting enough exercise.  Having diabetes, kidney disease, liver disease, or thyroid disease.  Drinking large amounts of alcohol.  Using certain medicines. What increases the risk? You are more likely to develop this condition if you are an older man or if you are a woman who has gone through menopause. Other risk factors include:  Having a family history of dyslipidemia.  Taking certain medicines, including birth control pills, steroids, some diuretics, and beta-blockers.  Smoking cigarettes.  Eating a high-fat diet.  Having certain medical conditions such as diabetes, polycystic ovary syndrome (PCOS), kidney disease, liver disease, or hypothyroidism.  Not exercising regularly.  Being overweight or obese with too much belly fat. What are the signs or symptoms? In most cases, dyslipidemia does not usually cause any symptoms. In severe cases, very high lipid levels can cause:  Fatty bumps under the skin (xanthomas).  White or gray ring around the black center (pupil) of the eye. Very high triglyceride levels can cause inflammation of the pancreas (pancreatitis). How is this diagnosed? Your health care provider may diagnose dyslipidemia based on a routine blood test (fasting blood test). Because most people do not have symptoms of the condition, this blood testing (lipid profile) is done on adults age 12 and older and is repeated every 5 years. This test checks:  Total cholesterol. This measures the total amount of cholesterol in your blood, including LDL  cholesterol, HDL cholesterol, and triglycerides. A healthy number is below 200.  LDL cholesterol. The target number for LDL cholesterol is different for each person, depending on individual risk factors. Ask your health care provider what your LDL  cholesterol should be.  HDL cholesterol. An HDL level of 60 or higher is best because it helps to protect against heart disease. A number below 40 for men or below 50 for women increases the risk for heart disease.  Triglycerides. A healthy triglyceride number is below 150. If your lipid profile is abnormal, your health care provider may do other blood tests. How is this treated? Treatment depends on the type of dyslipidemia that you have and your other risk factors for heart disease and stroke. Your health care provider will have a target range for your lipid levels based on this information. For many people, this condition may be treated by lifestyle changes, such as diet and exercise. Your health care provider may recommend that you:  Get regular exercise.  Make changes to your diet.  Quit smoking if you smoke. If diet changes and exercise do not help you reach your goals, your health care provider may also prescribe medicine to lower lipids. The most commonly prescribed type of medicine lowers your LDL cholesterol (statin drug). If you have a high triglyceride level, your provider may prescribe another type of drug (fibrate) or an omega-3 fish oil supplement, or both. Follow these instructions at home:  Eating and drinking  Follow instructions from your health care provider or dietitian about eating or drinking restrictions.  Eat a healthy diet as told by your health care provider. This can help you reach and maintain a healthy weight, lower your LDL cholesterol, and raise your HDL cholesterol. This may include: ? Limiting your calories, if you are overweight. ? Eating more fruits, vegetables, whole grains, fish, and lean meats. ? Limiting saturated fat, trans fat, and cholesterol.  If you drink alcohol: ? Limit how much you use. ? Be aware of how much alcohol is in your drink. In the [place] one drink equals one 12 oz bottle of beer (355 mL), one 5 oz glass of wine (148 mL), or one 1  oz glass of hard liquor (44 mL).  Do not drink alcohol if: ? Your health care provider tells you not to drink. ? You are pregnant, may be pregnant, or are planning to become pregnant. Activity  Get regular exercise. Start an exercise and strength training program as told by your health care provider. Ask your health care provider what activities are safe for you. Your health care provider may recommend: ? 30 minutes of aerobic activity 4-6 days a week. Brisk walking is an example of aerobic activity. ? Strength training 2 days a week. General instructions  Do not use any products that contain nicotine or tobacco, such as cigarettes, e-cigarettes, and chewing tobacco. If you need help quitting, ask your health care provider.  Take over-the-counter and prescription medicines only as told by your health care provider. This includes supplements.  Keep all follow-up visits as told by your health care provider. Contact a health care provider if:  You are: ? Having trouble sticking to your exercise or diet plan. ? Struggling to quit smoking or control your use of alcohol. Summary  Dyslipidemia often involves a high level of cholesterol or triglycerides, which are types of lipids.  Treatment depends on the type of dyslipidemia that you have and your other risk factors for heart disease and   stroke.  For many people, treatment starts with lifestyle changes, such as diet and exercise.  Your health care provider may prescribe medicine to lower lipids. This information is not intended to replace advice given to you by your health care provider. Make sure you discuss any questions you have with your health care provider. Document Revised: 01/06/2018 Document Reviewed: 12/13/2017 Elsevier Patient Education  [place].

## 2019-11-12 ENCOUNTER — Other Ambulatory Visit: Payer: Self-pay | Admitting: Emergency Medicine

## 2019-11-12 DIAGNOSIS — Z1231 Encounter for screening mammogram for malignant neoplasm of breast: Secondary | ICD-10-CM

## 2019-12-03 DIAGNOSIS — M546 Pain in thoracic spine: Secondary | ICD-10-CM | POA: Diagnosis not present

## 2019-12-03 DIAGNOSIS — M542 Cervicalgia: Secondary | ICD-10-CM | POA: Diagnosis not present

## 2019-12-04 DIAGNOSIS — M546 Pain in thoracic spine: Secondary | ICD-10-CM | POA: Diagnosis not present

## 2019-12-07 DIAGNOSIS — M546 Pain in thoracic spine: Secondary | ICD-10-CM | POA: Diagnosis not present

## 2019-12-30 ENCOUNTER — Ambulatory Visit
Admission: RE | Admit: 2019-12-30 | Discharge: 2019-12-30 | Disposition: A | Discharge status: Home / Self Care | Payer: Medicare Other | Source: Ambulatory Visit | Attending: Emergency Medicine | Admitting: Emergency Medicine

## 2019-12-30 ENCOUNTER — Other Ambulatory Visit: Payer: Self-pay

## 2019-12-30 DIAGNOSIS — Z1231 Encounter for screening mammogram for malignant neoplasm of breast: Secondary | ICD-10-CM

## 2020-03-01 ENCOUNTER — Other Ambulatory Visit: Payer: Self-pay | Admitting: Emergency Medicine

## 2020-03-01 NOTE — Telephone Encounter (Signed)
Requested Prescriptions  Pending Prescriptions Disp Refills  . EUTHYROX 75 MCG tablet [Pharmacy Med Name: Euthyrox 75 MCG Oral Tablet] 90 tablet 0    Sig: TAKE 1 TABLET BY MOUTH ONCE DAILY BEFORE BREAKFAST     Endocrinology:  Hypothyroid Agents Failed - 03/01/2020 10:40 AM      Failed - TSH needs to be rechecked within 3 months after an abnormal result. Refill until TSH is due.      Failed - TSH in normal range and within 360 days    TSH  Date Value Ref Range Status  09/30/2019 0.043 (L) 0.450 - 4.500 uIU/mL Final         Passed - Valid encounter within last 12 months    Recent Outpatient Visits          4 months ago Hypothyroidism, unspecified type   Primary Care at [facility], Ines Bloomer, MD   5 months ago Screening for hyperlipidemia   Primary Care at [facility], Ines Bloomer, MD   5 months ago Medicare annual wellness visit, subsequent   Primary Care at [facility], Arlie Solomons, MD   1 year ago Lumbar pain   Primary Care at [facility], Ines Bloomer, MD   1 year ago Strain of left trapezius muscle, initial encounter   Primary Care at [facility], Ines Bloomer, MD      Future Appointments            In 1 month Sagard[place], Ines Bloomer, MD Primary Care at [place], [facility]

## 2020-04-08 ENCOUNTER — Other Ambulatory Visit: Payer: Self-pay | Admitting: Obstetrics and Gynecology

## 2020-04-08 DIAGNOSIS — N6452 Nipple discharge: Secondary | ICD-10-CM | POA: Diagnosis not present

## 2020-04-08 DIAGNOSIS — Z6827 Body mass index (BMI) 27.0-27.9, adult: Secondary | ICD-10-CM | POA: Diagnosis not present

## 2020-04-11 ENCOUNTER — Encounter: Payer: Self-pay | Admitting: Emergency Medicine

## 2020-04-11 ENCOUNTER — Ambulatory Visit (INDEPENDENT_AMBULATORY_CARE_PROVIDER_SITE_OTHER): Payer: Medicare Other | Admitting: Emergency Medicine

## 2020-04-11 ENCOUNTER — Other Ambulatory Visit: Payer: Self-pay

## 2020-04-11 VITALS — BP 160/60 | HR 59 | Temp 98.0°F | Resp 16 | Ht 62.0 in | Wt 153.0 lb

## 2020-04-11 DIAGNOSIS — E785 Hyperlipidemia, unspecified: Secondary | ICD-10-CM

## 2020-04-11 DIAGNOSIS — E039 Hypothyroidism, unspecified: Secondary | ICD-10-CM

## 2020-04-11 MED ORDER — ROSUVASTATIN CALCIUM 10 MG PO TABS: 10.0000 mg | ORAL_TABLET | Freq: Every day | ORAL | 3 refills | Status: DC

## 2020-04-11 MED ORDER — LEVOTHYROXINE SODIUM 75 MCG PO TABS: ORAL_TABLET | ORAL | 3 refills | Status: DC

## 2020-04-11 NOTE — Progress Notes (Signed)
Julie Pacheco 74 y.o.   Chief Complaint  Patient presents with   Hypothyroidism    follow up-last office visit 10/05/2019   Medication Refill    levothyroxine    HISTORY OF PRESENT ILLNESS: This is a 74 y.o. female with history of hypothyroidism here for follow-up and medication refill.  Takes levothyroxine 75 mcg daily. Lipid panel last May showed increased LDL.  Prescribed rosuvastatin 10 mg daily but patient not taking it. Fully vaccinated against Covid. Feels well.  Has no complaints or medical concerns today. Health maintenance items reviewed with patient. Recently saw her OB/GYN doctor due to right bloody nipple discharge.  Negative work-up so far.  Has history of total abdominal hysterectomy in the past.   HPI   Prior to Admission medications   Medication Sig Start Date End Date Taking? Authorizing Provider  levothyroxine (EUTHYROX) 75 MCG tablet TAKE 1 TABLET BY MOUTH ONCE DAILY BEFORE BREAKFAST 04/11/20  Yes Laquasha Groome, Ines Bloomer, MD  rosuvastatin (CRESTOR) 10 MG tablet Take 1 tablet (10 mg total) by mouth daily. Patient not taking: Reported on 04/11/2020 10/05/19   Horald Pollen, MD    No Known Allergies  Patient Active Problem List   Diagnosis Date Noted   Dyslipidemia 10/05/2019   Other microscopic hematuria 09/11/2018   Transient hypertension 09/12/2016   Hypothyroidism 10/31/2015   Vitamin D deficiency 10/31/2015   BMI 28.0-28.9,adult 10/31/2015   Osteopenia 12/03/2014   H/O pheochromocytoma 09/07/2011   Insomnia 09/07/2011    Past Medical History:  Diagnosis Date   Atypical mole 02/05/2011   mild-Right post knee   Migraines    Thyroid disease     Past Surgical History:  Procedure Laterality Date   ABDOMINAL HYSTERECTOMY      Social History   Socioeconomic History   Marital status: Married    Spouse name: Not on file   Number of children: Not on file   Years of education: Not on file   Highest education level:  Not on file  Occupational History   Not on file  Tobacco Use   Smoking status: Former Smoker    Quit date: 12/15/1981    Years since quitting: 38.3   Smokeless tobacco: Never Used  Substance and Sexual Activity   Alcohol use: Yes   Drug use: Never   Sexual activity: Not on file  Other Topics Concern   Not on file  Social History Narrative   Not on file   Social Determinants of Health   Financial Resource Strain:    Difficulty of Paying Living Expenses: Not on file  Food Insecurity:    Worried About [place] in the Last Year: Not on file   [employer] of Food in the Last Year: Not on file  Transportation Needs:    Lack of Transportation (Medical): Not on file   Lack of Transportation (Non-Medical): Not on file  Physical Activity:    Days of Exercise per Week: Not on file   Minutes of Exercise per Session: Not on file  Stress:    Feeling of Stress : Not on file  Social Connections:    Frequency of Communication with Friends and Family: Not on file   Frequency of Social Gatherings with Friends and Family: Not on file   Attends Religious Services: Not on file   Active Member of Clubs or Organizations: Not on file   Attends Archivist Meetings: Not on file   Marital Status: Not on file  Intimate Partner  Violence:    Fear of Current or Ex-Partner: Not on file   Emotionally Abused: Not on file   Physically Abused: Not on file   Sexually Abused: Not on file    Family History  Problem Relation Age of Onset   Breast cancer Maternal Aunt      Review of Systems  Constitutional: Negative.  Negative for chills and fever.  HENT: Negative.  Negative for congestion and sore throat.   Respiratory: Negative.  Negative for cough and shortness of breath.   Cardiovascular: Negative.  Negative for chest pain and palpitations.  Gastrointestinal: Negative.  Negative for abdominal pain, blood in stool, constipation, diarrhea, melena, nausea  and vomiting.  Genitourinary: Negative.  Negative for dysuria, flank pain and hematuria.  Musculoskeletal: Negative.  Negative for back pain, myalgias and neck pain.  Skin: Negative.  Negative for rash.  Neurological: Negative.  Negative for dizziness and headaches.  All other systems reviewed and are negative.   Today's Vitals   04/11/20 0829  BP: (!) 150/78  Pulse: (!) 59  Resp: 16  Temp: 98 F (36.7 C)  TempSrc: Temporal  SpO2: 96%  Weight: 153 lb (69.4 kg)  Height: 5\' 2"  (1.575 m)   Body mass index is 27.98 kg/m.  Physical Exam Vitals reviewed.  Constitutional:      Appearance: Normal appearance.  HENT:     Head: Normocephalic.  Eyes:     Extraocular Movements: Extraocular movements intact.     Conjunctiva/sclera: Conjunctivae normal.     Pupils: Pupils are equal, round, and reactive to light.  Cardiovascular:     Rate and Rhythm: Normal rate and regular rhythm.     Pulses: Normal pulses.     Heart sounds: Normal heart sounds.  Pulmonary:     Effort: Pulmonary effort is normal.     Breath sounds: Normal breath sounds.  Musculoskeletal:        General: Normal range of motion.     Cervical back: Normal range of motion and neck supple.  Skin:    General: Skin is warm and dry.     Capillary Refill: Capillary refill takes less than 2 seconds.  Neurological:     General: No focal deficit present.     Mental Status: She is alert and oriented to person, place, and time.  Psychiatric:        Mood and Affect: Mood normal.        Behavior: Behavior normal.    A total of 30 minutes was spent with the patient, greater than 50% of which was in counseling/coordination of care regarding chronic medical problems and cardiovascular risks associated with these, review of all medications, review of most recent blood work results in particular lipid panel and possible need of starting therapy, education on nutrition, review of health maintenance items, review of most recent  office visit notes, documentation, prognosis and need for follow-up.   ASSESSMENT & PLAN: Clinically stable.  No medical concerns identified during this visit.  Continue present medications.  No changes. Follow-up in 6 months.  Julie Pacheco was seen today for hypothyroidism and medication refill.  Diagnoses and all orders for this visit:  Hypothyroidism, unspecified type -     levothyroxine (EUTHYROX) 75 MCG tablet; TAKE 1 TABLET BY MOUTH ONCE DAILY BEFORE BREAKFAST -     Comprehensive metabolic panel -     TSH  Dyslipidemia -     Lipid panel    Patient Instructions       If  you have lab work done today you will be contacted with your lab results within the next 2 weeks.  If you have not heard from [place] then please contact us. The fastest way to get your results is to register for My Chart.   IF you received an x-ray today, you will receive an invoice from [facility]iology. Please contact [facility] Radiology at [phone] with questions or concerns regarding your invoice.   IF you received labwork today, you will receive an invoice from [place]. Please contact [facility] at [phone] with questions or concerns regarding your invoice.   Our billing staff will not be able to assist you with questions regarding bills from these companies.  You will be contacted with the lab results as soon as they are available. The fastest way to get your results is to activate your My Chart account. Instructions are located on the last page of this paperwork. If you have not heard from [place] regarding the results in 2 weeks, please contact this office.     Health Maintenance After Age 77 After age 39, you are at a higher risk for certain long-term diseases and infections as well as injuries from falls. Falls are a major cause of broken bones and head injuries in people who are older than age 90. Getting regular preventive care can help to keep you healthy and well. Preventive care includes  getting regular testing and making lifestyle changes as recommended by your health care provider. Talk with your health care provider about:  Which screenings and tests you should have. A screening is a test that checks for a disease when you have no symptoms.  A diet and exercise plan that is right for you. What should I know about screenings and tests to prevent falls? Screening and testing are the best ways to find a health problem early. Early diagnosis and treatment give you the best chance of managing medical conditions that are common after age 30. Certain conditions and lifestyle choices may make you more likely to have a fall. Your health care provider may recommend:  Regular vision checks. Poor vision and conditions such as cataracts can make you more likely to have a fall. If you wear glasses, make sure to get your prescription updated if your vision changes.  Medicine review. Work with your health care provider to regularly review all of the medicines you are taking, including over-the-counter medicines. Ask your health care provider about any side effects that may make you more likely to have a fall. Tell your health care provider if any medicines that you take make you feel dizzy or sleepy.  Osteoporosis screening. Osteoporosis is a condition that causes the bones to get weaker. This can make the bones weak and cause them to break more easily.  Blood pressure screening. Blood pressure changes and medicines to control blood pressure can make you feel dizzy.  Strength and balance checks. Your health care provider may recommend certain tests to check your strength and balance while standing, walking, or changing positions.  Foot health exam. Foot pain and numbness, as well as not wearing proper footwear, can make you more likely to have a fall.  Depression screening. You may be more likely to have a fall if you have a fear of falling, feel emotionally low, or feel unable to do  activities that you used to do.  Alcohol use screening. Using too much alcohol can affect your balance and may make you more likely to have a fall.  What actions can I take to lower my risk of falls? General instructions  Talk with your health care provider about your risks for falling. Tell your health care provider if: ? You fall. Be sure to tell your health care provider about all falls, even ones that seem minor. ? You feel dizzy, sleepy, or off-balance.  Take over-the-counter and prescription medicines only as told by your health care provider. These include any supplements.  Eat a healthy diet and maintain a healthy weight. A healthy diet includes low-fat dairy products, low-fat (lean) meats, and fiber from whole grains, beans, and lots of fruits and vegetables. Home safety  Remove any tripping hazards, such as rugs, cords, and clutter.  Install safety equipment such as grab bars in bathrooms and safety rails on stairs.  Keep rooms and walkways well-lit. Activity   Follow a regular exercise program to stay fit. This will help you maintain your balance. Ask your health care provider what types of exercise are appropriate for you.  If you need a cane or walker, use it as recommended by your health care provider.  Wear supportive shoes that have nonskid soles. Lifestyle  Do not drink alcohol if your health care provider tells you not to drink.  If you drink alcohol, limit how much you have: ? 0-1 drink a day for women. ? 0-2 drinks a day for men.  Be aware of how much alcohol is in your drink. In the [place] one drink equals one typical bottle of beer (12 oz), one-half glass of wine (5 oz), or one shot of hard liquor (1 oz).  Do not use any products that contain nicotine or tobacco, such as cigarettes and e-cigarettes. If you need help quitting, ask your health care provider. Summary  Having a healthy lifestyle and getting preventive care can help to protect your health and  wellness after age 5.  Screening and testing are the best way to find a health problem early and help you avoid having a fall. Early diagnosis and treatment give you the best chance for managing medical conditions that are more common for people who are older than age 48.  Falls are a major cause of broken bones and head injuries in people who are older than age 77. Take precautions to prevent a fall at home.  Work with your health care provider to learn what changes you can make to improve your health and wellness and to prevent falls. This information is not intended to replace advice given to you by your health care provider. Make sure you discuss any questions you have with your health care provider. Document Revised: 09/04/2018 Document Reviewed: 03/27/2017 Elsevier Patient Education  2020 [employer]    Agustina Caroli, MD [facility]

## 2020-04-11 NOTE — Patient Instructions (Addendum)
   If you have lab work done today you will be contacted with your lab results within the next 2 weeks.  If you have not heard from us then please contact us. The fastest way to get your results is to register for My Chart.   IF you received an x-ray today, you will receive an invoice from [facility]. Please contact [facility] at [phone] with questions or concerns regarding your invoice.   IF you received labwork today, you will receive an invoice from [facility]. Please contact [facility] at [phone] with questions or concerns regarding your invoice.   Our billing staff will not be able to assist you with questions regarding bills from these companies.  You will be contacted with the lab results as soon as they are available. The fastest way to get your results is to activate your My Chart account. Instructions are located on the last page of this paperwork. If you have not heard from us regarding the results in 2 weeks, please contact this office.     Health Maintenance After Age 65 After age 65, you are at a higher risk for certain long-term diseases and infections as well as injuries from falls. Falls are a major cause of broken bones and head injuries in people who are older than age 65. Getting regular preventive care can help to keep you healthy and well. Preventive care includes getting regular testing and making lifestyle changes as recommended by your health care provider. Talk with your health care provider about:  Which screenings and tests you should have. A screening is a test that checks for a disease when you have no symptoms.  A diet and exercise plan that is right for you. What should I know about screenings and tests to prevent falls? Screening and testing are the best ways to find a health problem early. Early diagnosis and treatment give you the best chance of managing medical conditions that are common after age 65. Certain conditions and  lifestyle choices may make you more likely to have a fall. Your health care provider may recommend:  Regular vision checks. Poor vision and conditions such as cataracts can make you more likely to have a fall. If you wear glasses, make sure to get your prescription updated if your vision changes.  Medicine review. Work with your health care provider to regularly review all of the medicines you are taking, including over-the-counter medicines. Ask your health care provider about any side effects that may make you more likely to have a fall. Tell your health care provider if any medicines that you take make you feel dizzy or sleepy.  Osteoporosis screening. Osteoporosis is a condition that causes the bones to get weaker. This can make the bones weak and cause them to break more easily.  Blood pressure screening. Blood pressure changes and medicines to control blood pressure can make you feel dizzy.  Strength and balance checks. Your health care provider may recommend certain tests to check your strength and balance while standing, walking, or changing positions.  Foot health exam. Foot pain and numbness, as well as not wearing proper footwear, can make you more likely to have a fall.  Depression screening. You may be more likely to have a fall if you have a fear of falling, feel emotionally low, or feel unable to do activities that you used to do.  Alcohol use screening. Using too much alcohol can affect your balance and may make you more likely to   have a fall. What actions can I take to lower my risk of falls? General instructions  Talk with your health care provider about your risks for falling. Tell your health care provider if: ? You fall. Be sure to tell your health care provider about all falls, even ones that seem minor. ? You feel dizzy, sleepy, or off-balance.  Take over-the-counter and prescription medicines only as told by your health care provider. These include any  supplements.  Eat a healthy diet and maintain a healthy weight. A healthy diet includes low-fat dairy products, low-fat (lean) meats, and fiber from whole grains, beans, and lots of fruits and vegetables. Home safety  Remove any tripping hazards, such as rugs, cords, and clutter.  Install safety equipment such as grab bars in bathrooms and safety rails on stairs.  Keep rooms and walkways well-lit. Activity   Follow a regular exercise program to stay fit. This will help you maintain your balance. Ask your health care provider what types of exercise are appropriate for you.  If you need a cane or walker, use it as recommended by your health care provider.  Wear supportive shoes that have nonskid soles. Lifestyle  Do not drink alcohol if your health care provider tells you not to drink.  If you drink alcohol, limit how much you have: ? 0-1 drink a day for women. ? 0-2 drinks a day for men.  Be aware of how much alcohol is in your drink. In the [place] one drink equals one typical bottle of beer (12 oz), one-half glass of wine (5 oz), or one shot of hard liquor (1 oz).  Do not use any products that contain nicotine or tobacco, such as cigarettes and e-cigarettes. If you need help quitting, ask your health care provider. Summary  Having a healthy lifestyle and getting preventive care can help to protect your health and wellness after age 65.  Screening and testing are the best way to find a health problem early and help you avoid having a fall. Early diagnosis and treatment give you the best chance for managing medical conditions that are more common for people who are older than age 65.  Falls are a major cause of broken bones and head injuries in people who are older than age 65. Take precautions to prevent a fall at home.  Work with your health care provider to learn what changes you can make to improve your health and wellness and to prevent falls. This information is not intended  to replace advice given to you by your health care provider. Make sure you discuss any questions you have with your health care provider. Document Revised: 09/04/2018 Document Reviewed: 03/27/2017 Elsevier Patient Education  2020 [employer]  

## 2020-04-11 NOTE — Addendum Note (Signed)
Addended by: Davina Poke on: 04/11/2020 09:36 AM   Modules accepted: Orders

## 2020-04-11 NOTE — Progress Notes (Signed)
The 10-year ASCVD risk score Mikey Bussing DC Brooke Bonito., et al., 2013) is: 23.6%   Values used to calculate the score:     Age: 74 years     Sex: Female     Is Non-Hispanic African American: No     Diabetic: No     Tobacco smoker: No     Systolic Blood Pressure: 923 mmHg     Is BP treated: No     HDL Cholesterol: 62 mg/dL     Total Cholesterol: 191 mg/dL

## 2020-04-12 LAB — LIPID PANEL
Chol/HDL Ratio: 3 ratio (ref 0.0–4.4)
Cholesterol, Total: 175 mg/dL (ref 100–199)
HDL: 58 mg/dL (ref >39)
LDL Chol Calc (NIH): 103 mg/dL — ABNORMAL HIGH (ref 0–99)
Triglycerides: 75 mg/dL (ref 0–149)
VLDL Cholesterol Cal: 14 mg/dL (ref 5–40)

## 2020-04-12 LAB — COMPREHENSIVE METABOLIC PANEL
ALT: 15 IU/L (ref 0–32)
AST: 16 IU/L (ref 0–40)
Albumin/Globulin Ratio: 1.5 (ref 1.2–2.2)
Albumin: 3.8 g/dL (ref 3.7–4.7)
Alkaline Phosphatase: 77 IU/L (ref 44–121)
BUN/Creatinine Ratio: 14 (ref 12–28)
BUN: 11 mg/dL (ref 8–27)
Bilirubin Total: 0.4 mg/dL (ref 0.0–1.2)
CO2: 22 mmol/L (ref 20–29)
Calcium: 9 mg/dL (ref 8.7–10.3)
Chloride: 106 mmol/L (ref 96–106)
Creatinine, Ser: 0.8 mg/dL (ref 0.57–1.00)
GFR calc Af Amer: 83 mL/min/{1.73_m2} (ref >59)
GFR calc non Af Amer: 72 mL/min/{1.73_m2} (ref >59)
Globulin, Total: 2.5 g/dL (ref 1.5–4.5)
Glucose: 100 mg/dL — ABNORMAL HIGH (ref 65–99)
Potassium: 4.3 mmol/L (ref 3.5–5.2)
Sodium: 141 mmol/L (ref 134–144)
Total Protein: 6.3 g/dL (ref 6.0–8.5)

## 2020-04-12 LAB — TSH: TSH: 0.095 u[IU]/mL — ABNORMAL LOW (ref 0.450–4.500)

## 2020-04-26 ENCOUNTER — Other Ambulatory Visit: Payer: Self-pay

## 2020-04-26 ENCOUNTER — Ambulatory Visit
Admission: RE | Admit: 2020-04-26 | Discharge: 2020-04-26 | Disposition: A | Discharge status: Home / Self Care | Payer: Medicare Other | Source: Ambulatory Visit | Attending: Obstetrics and Gynecology | Admitting: Obstetrics and Gynecology

## 2020-04-26 DIAGNOSIS — N6452 Nipple discharge: Secondary | ICD-10-CM

## 2020-04-26 DIAGNOSIS — N6341 Unspecified lump in right breast, subareolar: Secondary | ICD-10-CM | POA: Diagnosis not present

## 2020-05-11 DIAGNOSIS — Z23 Encounter for immunization: Secondary | ICD-10-CM | POA: Diagnosis not present

## 2020-05-25 ENCOUNTER — Encounter: Payer: Self-pay | Admitting: Emergency Medicine

## 2020-05-25 ENCOUNTER — Other Ambulatory Visit: Payer: Self-pay

## 2020-05-25 ENCOUNTER — Ambulatory Visit
Admission: EM | Admit: 2020-05-25 | Discharge: 2020-05-25 | Disposition: A | Discharge status: Home / Self Care | Payer: Medicare Other | Attending: Family Medicine | Admitting: Family Medicine

## 2020-05-25 DIAGNOSIS — R350 Frequency of micturition: Secondary | ICD-10-CM | POA: Diagnosis not present

## 2020-05-25 DIAGNOSIS — R109 Unspecified abdominal pain: Secondary | ICD-10-CM | POA: Insufficient documentation

## 2020-05-25 LAB — POCT URINALYSIS DIP (MANUAL ENTRY)
Bilirubin, UA: NEGATIVE
Glucose, UA: NEGATIVE mg/dL
Ketones, POC UA: NEGATIVE mg/dL
Leukocytes, UA: NEGATIVE
Nitrite, UA: NEGATIVE
Protein Ur, POC: NEGATIVE mg/dL
Spec Grav, UA: 1.01 (ref 1.010–1.025)
Urobilinogen, UA: 0.2 E.U./dL
pH, UA: 5.5 (ref 5.0–8.0)

## 2020-05-25 MED ORDER — PHENAZOPYRIDINE HCL 200 MG PO TABS: 200.0000 mg | ORAL_TABLET | Freq: Three times a day (TID) | ORAL | 0 refills | Status: DC

## 2020-05-25 NOTE — Discharge Instructions (Addendum)
You may have a urinary tract infection. We are going to culture your urine and will call you as soon as we have the results.   Drink plenty of water, 8-10 glasses per day.   You may take AZO over the counter for painful urination.  Follow up with your primary care provider as needed.   Go to the Emergency Department if you experience severe pain, shortness of breath, high fever, or other concerns.   

## 2020-05-25 NOTE — ED Triage Notes (Addendum)
Urinary frequency and pressure x 2 days

## 2020-05-25 NOTE — ED Provider Notes (Signed)
[facility]   CC: UTI  SUBJECTIVE:  Julie Pacheco is a 74 y.o. female who complains of urinary frequency, urgency and low abdominal pressure the past 2 days. Patient denies a precipitating event, recent sexual encounter, excessive caffeine intake. Localizes the pain to the lower abdomen. Pain is intermittent and describes it as pressure.  Has not tried OTC medications without relief.  Symptoms are made worse with urination. Admits to similar symptoms in the past. Denies fever, chills, nausea, vomiting,  flank pain, abnormal vaginal discharge or bleeding, hematuria.    LMP: No LMP recorded. Patient is postmenopausal.  ROS: As in HPI.  All other pertinent ROS negative.     Past Medical History:  Diagnosis Date   Atypical mole 02/05/2011   mild-Right post knee   Migraines    Thyroid disease    Past Surgical History:  Procedure Laterality Date   ABDOMINAL HYSTERECTOMY     No Known Allergies No current facility-administered medications on file prior to encounter.   Current Outpatient Medications on File Prior to Encounter  Medication Sig Dispense Refill   levothyroxine (EUTHYROX) 75 MCG tablet TAKE 1 TABLET BY MOUTH ONCE DAILY BEFORE BREAKFAST 90 tablet 3   rosuvastatin (CRESTOR) 10 MG tablet Take 1 tablet (10 mg total) by mouth daily. 90 tablet 3   Social History   Socioeconomic History   Marital status: Married    Spouse name: Not on file   Number of children: Not on file   Years of education: Not on file   Highest education level: Not on file  Occupational History   Not on file  Tobacco Use   Smoking status: Former Smoker    Quit date: 12/15/1981    Years since quitting: 38.4   Smokeless tobacco: Never Used  Substance and Sexual Activity   Alcohol use: Yes   Drug use: Never   Sexual activity: Not on file  Other Topics Concern   Not on file  Social History Narrative   Not on file   Social Determinants of Health   Financial Resource  Strain: Not on file  Food Insecurity: Not on file  Transportation Needs: Not on file  Physical Activity: Not on file  Stress: Not on file  Social Connections: Not on file  Intimate Partner Violence: Not on file   Family History  Problem Relation Age of Onset   Breast cancer Maternal Aunt     OBJECTIVE:  Vitals:   05/25/20 1233  BP: (!) 159/70  Pulse: 60  Resp: 16  Temp: 98.2 F (36.8 C)  TempSrc: Oral  SpO2: 95%   General appearance: AOx3 in no acute distress HEENT: NCAT. Oropharynx clear.  Lungs: clear to auscultation bilaterally without adventitious breath sounds Heart: regular rate and rhythm. Radial pulses 2+ symmetrical bilaterally Abdomen: soft; non-distended; suprapubic tenderness; bowel sounds present; no guarding or rebound tenderness Back: no CVA tenderness Extremities: no edema; symmetrical with no gross deformities Skin: warm and dry Neurologic: Ambulates from chair to exam table without difficulty Psychological: alert and cooperative; normal mood and affect  Labs Reviewed  POCT URINALYSIS DIP (MANUAL ENTRY) - Abnormal; Notable for the following components:      Result Value   Blood, UA small (*)    All other components within normal limits  URINE CULTURE    ASSESSMENT & PLAN:  1. Urinary frequency   2. Abdominal pressure     Meds ordered this encounter  Medications   phenazopyridine (PYRIDIUM) 200 MG tablet  Sig: Take 1 tablet (200 mg total) by mouth 3 (three) times daily.    Dispense:  6 tablet    Refill:  0    Order Specific Question:   Supervising Provider    Answer:   Merrilee Jansky [[id number]]   UA with small amount of blood in office today Prescribed Pyridium Urine culture sent  We will call you with abnormal results that need further treatment Push fluids and get plenty of rest Take antibiotic as directed and to completion Take pyridium as prescribed and as needed for symptomatic relief Follow up with PCP if symptoms  persists Return here or go to ER if you have any new or worsening symptoms such as fever, worsening abdominal pain, nausea/vomiting, flank pain  Outlined signs and symptoms indicating need for more acute intervention Patient verbalized understanding After Visit Summary given     Moshe Cipro, NP 05/25/20 1312

## 2020-05-27 LAB — URINE CULTURE

## 2020-05-30 DIAGNOSIS — N6452 Nipple discharge: Secondary | ICD-10-CM | POA: Diagnosis not present

## 2020-06-08 IMAGING — DX BILATERAL RIBS AND CHEST - 4+ VIEW
8 of 9 series · 8 of 9 positions shown · non-contrast
Comparison: 06/28/2016

CLINICAL DATA: Bilateral rib pain.

EXAM:
BILATERAL RIBS AND CHEST - 4+ VIEW

[chest pa]
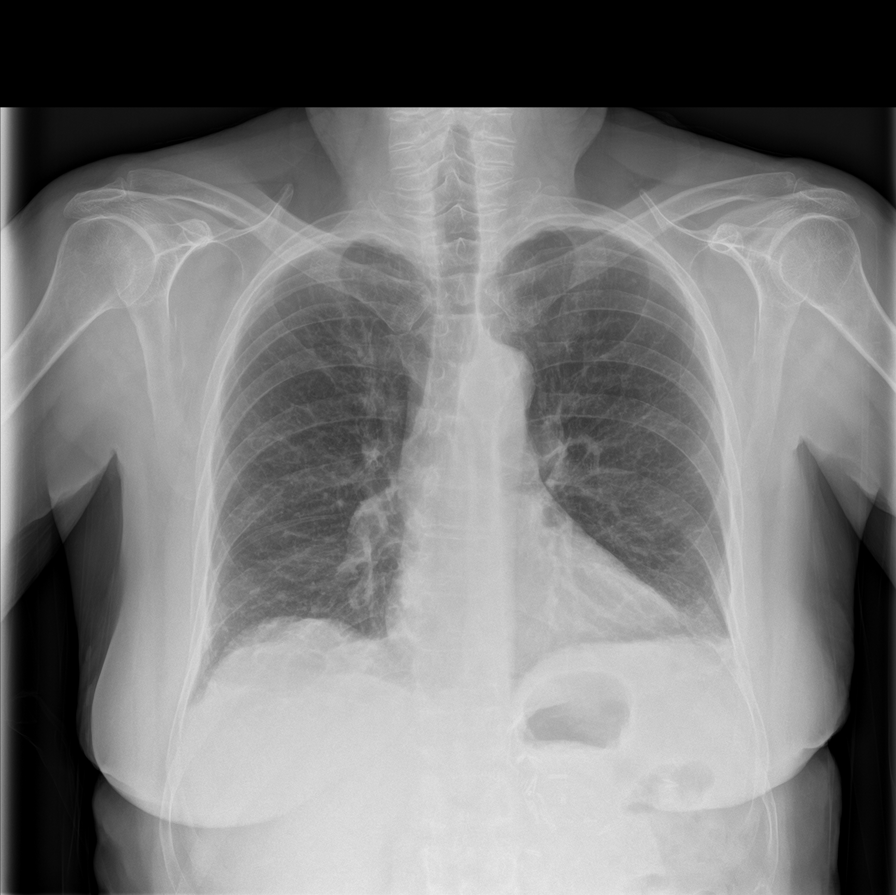

[rib pa obl (1 of 3)]
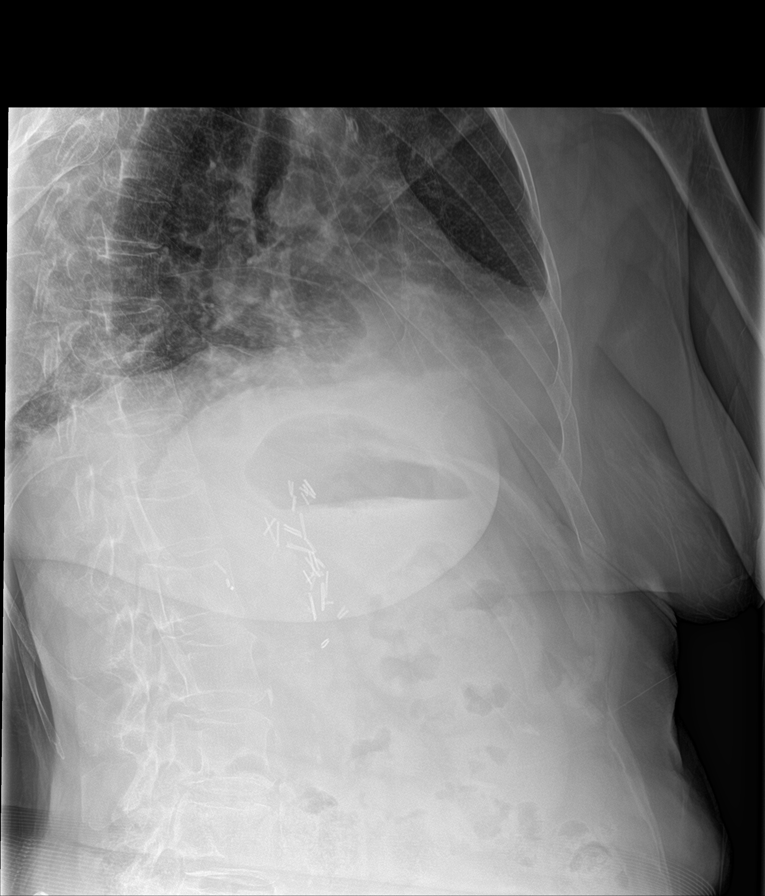

[rib pa obl (2 of 3)]
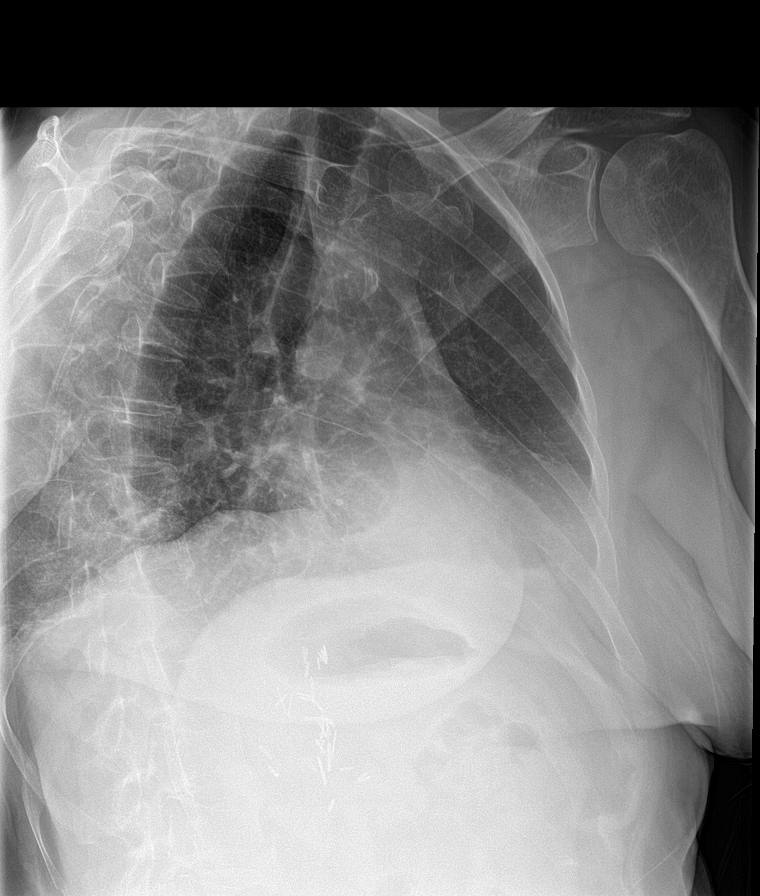

[rib ap]
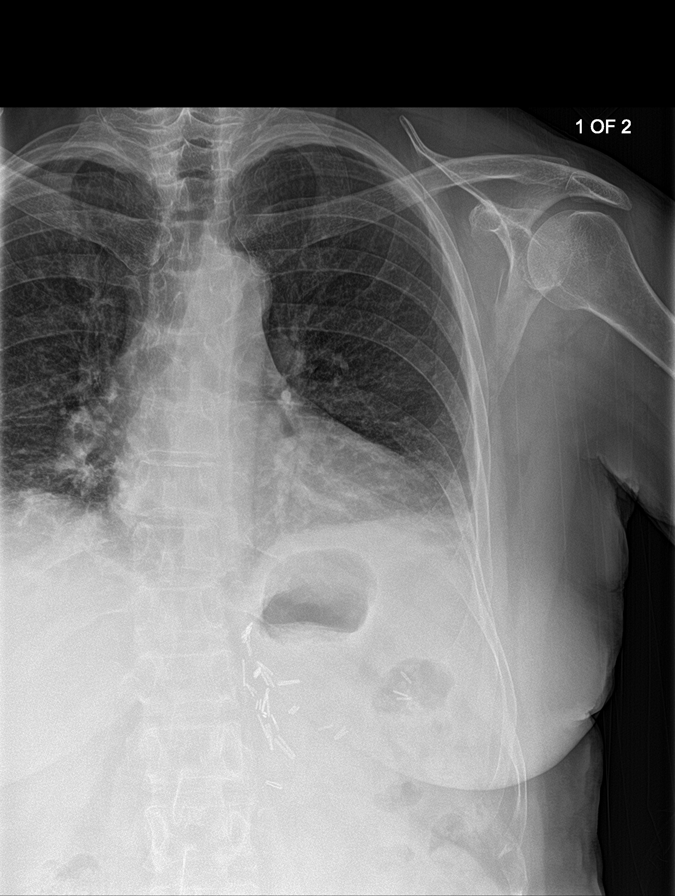

[rib pa (1 of 3)]
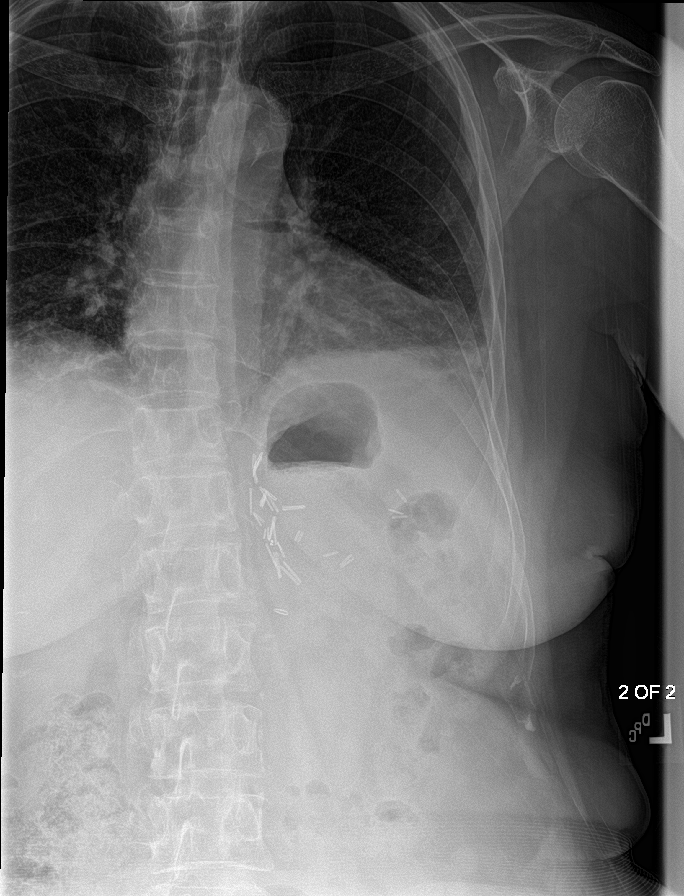

[rib pa (2 of 3)]
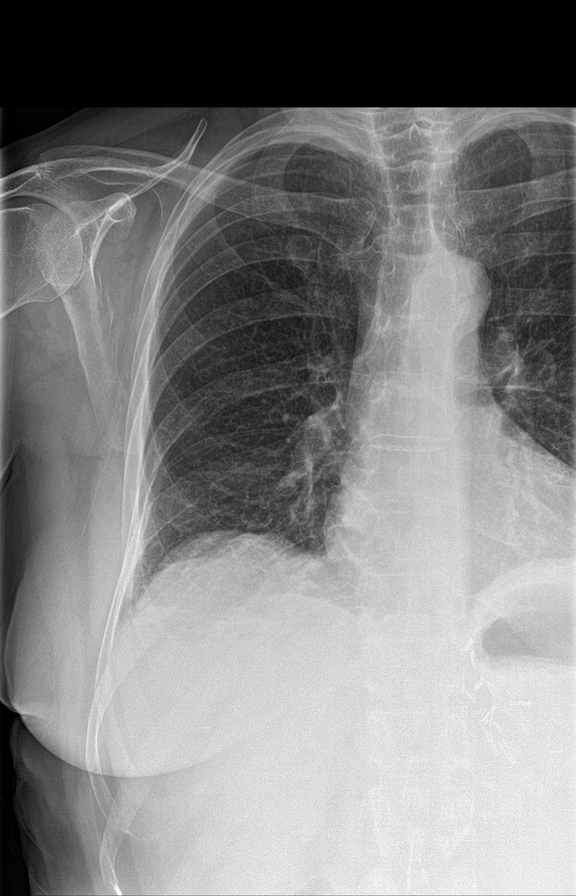

[rib pa (3 of 3)]
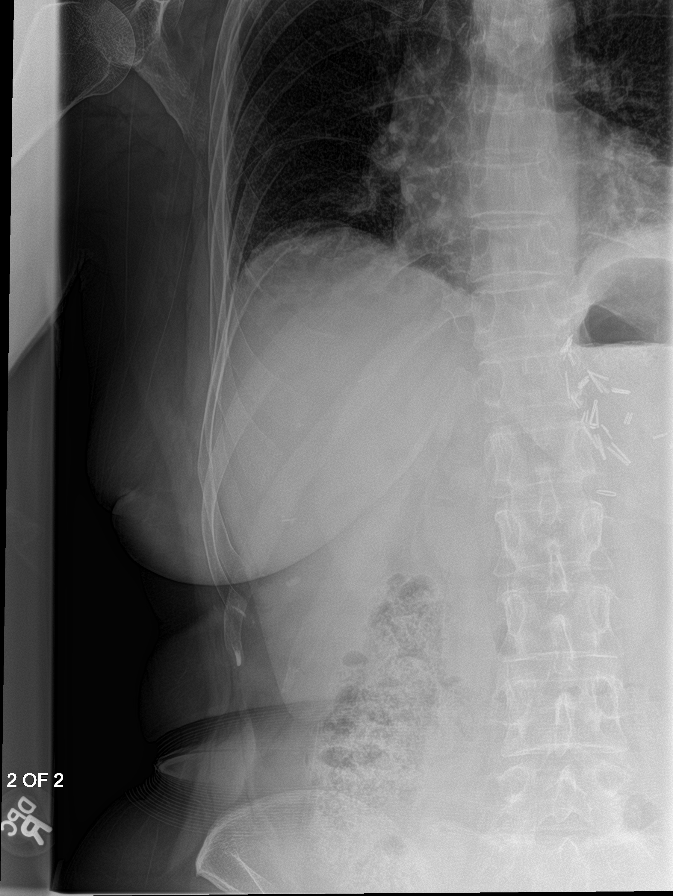

[rib pa obl (3 of 3)]
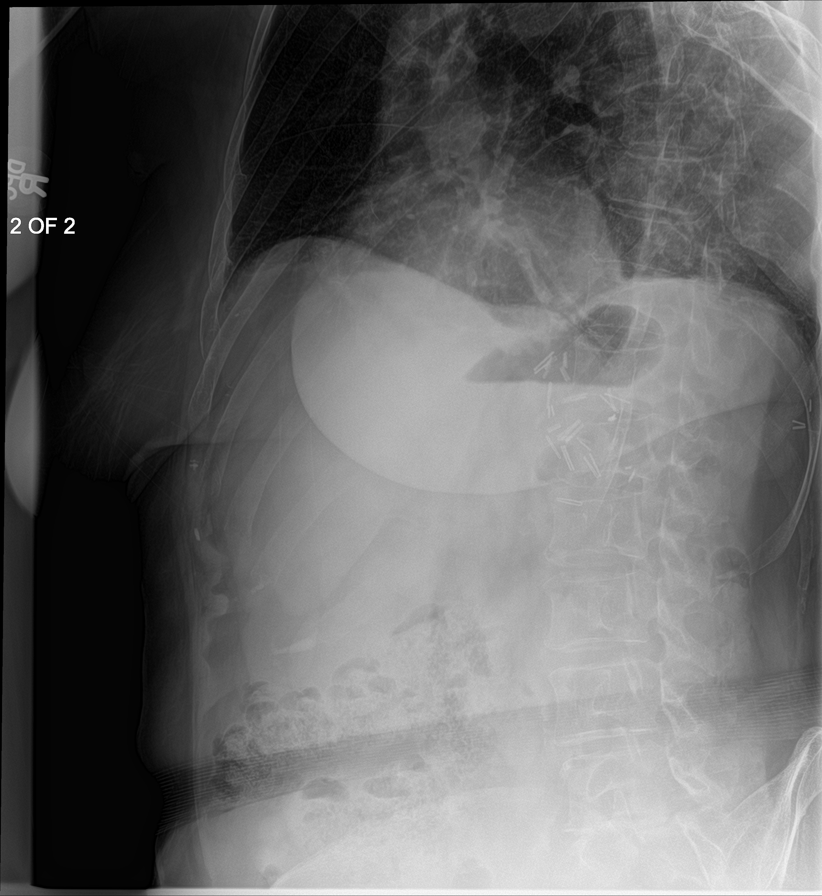

[8 of 9 positions shown; findings below may reference images not displayed]

FINDINGS: The heart size is normal. There is some blunting of the costophrenic
angles bilaterally, likely suggestive of trace bilateral pleural
effusions and or atelectasis. There is no pneumothorax. There is no
displaced rib fracture.
IMPRESSION: 1. No acute displaced fracture.  No pneumothorax.
2. Mild blunting of the left costophrenic angle favored to represent
a trace left-sided pleural effusion and/or atelectasis.

## 2020-06-28 ENCOUNTER — Other Ambulatory Visit: Payer: Self-pay | Admitting: General Surgery

## 2020-06-28 DIAGNOSIS — N6452 Nipple discharge: Secondary | ICD-10-CM

## 2020-08-10 ENCOUNTER — Ambulatory Visit
Admission: RE | Admit: 2020-08-10 | Discharge: 2020-08-10 | Disposition: A | Discharge status: Home / Self Care | Payer: Medicare Other | Source: Ambulatory Visit | Attending: General Surgery | Admitting: General Surgery

## 2020-08-10 ENCOUNTER — Other Ambulatory Visit: Payer: Self-pay | Admitting: General Surgery

## 2020-08-10 ENCOUNTER — Other Ambulatory Visit: Payer: Self-pay

## 2020-08-10 DIAGNOSIS — N6310 Unspecified lump in the right breast, unspecified quadrant: Secondary | ICD-10-CM | POA: Diagnosis not present

## 2020-08-10 DIAGNOSIS — N6452 Nipple discharge: Secondary | ICD-10-CM

## 2020-08-11 ENCOUNTER — Other Ambulatory Visit: Payer: Self-pay | Admitting: General Surgery

## 2020-08-11 DIAGNOSIS — N6452 Nipple discharge: Secondary | ICD-10-CM

## 2020-09-07 DIAGNOSIS — N6452 Nipple discharge: Secondary | ICD-10-CM | POA: Diagnosis not present

## 2020-09-27 ENCOUNTER — Ambulatory Visit (INDEPENDENT_AMBULATORY_CARE_PROVIDER_SITE_OTHER): Payer: Medicare Other | Admitting: Dermatology

## 2020-09-27 ENCOUNTER — Other Ambulatory Visit: Payer: Self-pay

## 2020-09-27 DIAGNOSIS — Z1283 Encounter for screening for malignant neoplasm of skin: Secondary | ICD-10-CM | POA: Diagnosis not present

## 2020-09-27 DIAGNOSIS — Z86018 Personal history of other benign neoplasm: Secondary | ICD-10-CM

## 2020-09-27 DIAGNOSIS — L821 Other seborrheic keratosis: Secondary | ICD-10-CM | POA: Diagnosis not present

## 2020-10-05 ENCOUNTER — Ambulatory Visit: Payer: Medicare Other | Admitting: Dermatology

## 2020-10-09 ENCOUNTER — Encounter: Payer: Self-pay | Admitting: Dermatology

## 2020-10-09 NOTE — Progress Notes (Signed)
   Follow-Up Visit   Subjective  Julie Pacheco is a 75 y.o. female who presents for the following: Annual Exam (Full body skin exam concerns are lesion on chest is brown and a little irritated. Also check patients scalp. No history of skin cancers. Patient does have history of atypical mole mild right knee. ).  General skin examination Location:  Duration:  Quality:  Associated Signs/Symptoms: Modifying Factors:  Severity:  Timing: Context:   Objective  Well appearing patient in no apparent distress; mood and affect are within normal limits. Objective  Head - Anterior (Face): General skin examination.  No new or recurrent atypical mole or pigmented lesion.  No nonmelanoma skin cancer.  Small textured seborrheic keratosis on chest is safe to leave if stable.    A full examination was performed including scalp, head, eyes, ears, nose, lips, neck, chest, axillae, abdomen, back, buttocks, bilateral upper extremities, bilateral lower extremities, hands, feet, fingers, toes, fingernails, and toenails. All findings within normal limits unless otherwise noted below.  Areas beneath undergarments not fully examined.Skin cancer screening performed today.    Assessment & Plan    Skin exam for malignant neoplasm Head - Anterior (Face)  Annual skin examination.  Encouraged to self examine twice annually.      I, Lavonna Monarch, MD, have reviewed all documentation for this visit.  The documentation on 10/09/20 for the exam, diagnosis, procedures, and orders are all accurate and complete.

## 2020-10-12 ENCOUNTER — Ambulatory Visit: Payer: Self-pay | Admitting: Emergency Medicine

## 2020-10-19 ENCOUNTER — Ambulatory Visit (INDEPENDENT_AMBULATORY_CARE_PROVIDER_SITE_OTHER): Payer: Medicare Other | Admitting: Emergency Medicine

## 2020-10-19 ENCOUNTER — Encounter: Payer: Self-pay | Admitting: Emergency Medicine

## 2020-10-19 ENCOUNTER — Other Ambulatory Visit: Payer: Self-pay

## 2020-10-19 VITALS — BP 130/72 | HR 56 | Temp 98.3°F | Ht 62.0 in | Wt 153.2 lb

## 2020-10-19 DIAGNOSIS — E785 Hyperlipidemia, unspecified: Secondary | ICD-10-CM | POA: Diagnosis not present

## 2020-10-19 DIAGNOSIS — E039 Hypothyroidism, unspecified: Secondary | ICD-10-CM

## 2020-10-19 LAB — TSH: TSH: 0.06 u[IU]/mL — ABNORMAL LOW (ref 0.35–4.50)

## 2020-10-19 LAB — COMPREHENSIVE METABOLIC PANEL
ALT: 17 U/L (ref 0–35)
AST: 20 U/L (ref 0–37)
Albumin: 3.8 g/dL (ref 3.5–5.2)
Alkaline Phosphatase: 59 U/L (ref 39–117)
BUN: 14 mg/dL (ref 6–23)
CO2: 24 mEq/L (ref 19–32)
Calcium: 9 mg/dL (ref 8.4–10.5)
Chloride: 107 mEq/L (ref 96–112)
Creatinine, Ser: 0.68 mg/dL (ref 0.40–1.20)
GFR: 84.41 mL/min (ref >60.00)
Glucose, Bld: 95 mg/dL (ref 70–99)
Potassium: 4.3 mEq/L (ref 3.5–5.1)
Sodium: 140 mEq/L (ref 135–145)
Total Bilirubin: 0.6 mg/dL (ref 0.2–1.2)
Total Protein: 6.9 g/dL (ref 6.0–8.3)

## 2020-10-19 LAB — LIPID PANEL
Cholesterol: 172 mg/dL (ref 0–200)
HDL: 54.4 mg/dL (ref >39.00)
LDL Cholesterol: 102 mg/dL — ABNORMAL HIGH (ref 0–99)
NonHDL: 117.91
Total CHOL/HDL Ratio: 3
Triglycerides: 81 mg/dL (ref 0.0–149.0)
VLDL: 16.2 mg/dL (ref 0.0–40.0)

## 2020-10-19 NOTE — Assessment & Plan Note (Signed)
Diet and nutrition discussed.  We will do fasting lipid profile today.

## 2020-10-19 NOTE — Progress Notes (Signed)
Julie Pacheco 75 y.o.   Chief Complaint  Patient presents with  . Hypothyroidism    Pt here for 6 month follow up on thyroid.    HISTORY OF PRESENT ILLNESS: This is a 75 y.o. female with history of hypothyroidism here for follow-up. Doing well.  Taking levothyroxine 75 mcg daily.  No complaints.  No side effects. History of dyslipidemia.  Was started on rosuvastatin but did not like how it made her feel so she stopped it.  States she has been eating better.  Fasting today.  We will do lipid profile today. No other complaints or medical concerns today.  HPI   Prior to Admission medications   Medication Sig Start Date End Date Taking? Authorizing Provider  levothyroxine (EUTHYROX) 75 MCG tablet TAKE 1 TABLET BY MOUTH ONCE DAILY BEFORE BREAKFAST 04/11/20  Yes Zachery Niswander, Ines Bloomer, MD    No Known Allergies  Patient Active Problem List   Diagnosis Date Noted  . Dyslipidemia 10/05/2019  . Other microscopic hematuria 09/11/2018  . Transient hypertension 09/12/2016  . Hypothyroidism 10/31/2015  . Vitamin D deficiency 10/31/2015  . BMI 28.0-28.9,adult 10/31/2015  . Osteopenia 12/03/2014  . 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 Insomnia 09/07/2011    Past Medical History:  Diagnosis Date  . Atypical mole 02/05/2011   mild-Right post knee  . Migraines   . Thyroid disease     Past Surgical History:  Procedure Laterality Date  . ABDOMINAL HYSTERECTOMY      Social History   Socioeconomic History  . Marital status: Married    Spouse name: Not on file  . Number of children: Not on file  . Years of education: Not on file  . Highest education level: Not on file  Occupational History  . Not on file  Tobacco Use  . Smoking status: Former Smoker    Quit date: 12/15/1981    Years since quitting: 38.8  . Smokeless tobacco: Never Used  Substance and Sexual Activity  . Alcohol use: Yes  . Drug use: Never  . Sexual activity: Not on file  Other Topics Concern  . Not on file   Social History Narrative  . Not on file   Social Determinants of Health   Financial Resource Strain: Not on file  Food Insecurity: Not on file  Transportation Needs: Not on file  Physical Activity: Not on file  Stress: Not on file  Social Connections: Not on file  Intimate Partner Violence: Not on file    Family History  Problem Relation Age of Onset  . Breast cancer Maternal Aunt      Review of Systems  Constitutional: Negative.  Negative for chills and fever.  HENT: Negative.  Negative for congestion and sore throat.   Respiratory: Negative.  Negative for cough and shortness of breath.   Cardiovascular: Negative.  Negative for chest pain and palpitations.  Gastrointestinal: Negative for abdominal pain, diarrhea, nausea and vomiting.  Genitourinary: Negative.  Negative for dysuria and hematuria.  Musculoskeletal: Negative.  Negative for back pain, myalgias and neck pain.  Skin: Negative.  Negative for rash.  Neurological: Negative.  Negative for dizziness and headaches.  All other systems reviewed and are negative.     Today's Vitals   10/19/20 1033  BP: 130/72  Pulse: (!) 56  Temp: 98.3 F (36.8 C)  TempSrc: Oral  SpO2: 98%  Weight: 153 lb 3.2 oz (69.5 kg)  Height: 5\' 2"  (1.575 m)   Body mass index is 28.02 kg/m. Wt Readings from  Last 3 Encounters:  10/19/20 153 lb 3.2 oz (69.5 kg)  04/11/20 153 lb (69.4 kg)  10/05/19 151 lb (68.5 kg)    Physical Exam Vitals reviewed.  Constitutional:      Appearance: Normal appearance.  HENT:     Head: Normocephalic.  Eyes:     Extraocular Movements: Extraocular movements intact.     Conjunctiva/sclera: Conjunctivae normal.     Pupils: Pupils are equal, round, and reactive to light.  Cardiovascular:     Rate and Rhythm: Normal rate and regular rhythm.     Pulses: Normal pulses.     Heart sounds: Normal heart sounds.  Pulmonary:     Effort: Pulmonary effort is normal.     Breath sounds: Normal breath sounds.   Abdominal:     General: There is no distension.     Palpations: Abdomen is soft.     Tenderness: There is no abdominal tenderness.  Musculoskeletal:        General: Normal range of motion.     Cervical back: Normal range of motion and neck supple.     Right lower leg: No edema.     Left lower leg: No edema.  Skin:    General: Skin is warm and dry.     Capillary Refill: Capillary refill takes less than 2 seconds.  Neurological:     General: No focal deficit present.     Mental Status: She is alert and oriented to person, place, and time.  Psychiatric:        Mood and Affect: Mood normal.        Behavior: Behavior normal.      ASSESSMENT & PLAN: A total of 30 minutes was spent with the patient and counseling/coordination of care regarding chronic medical problems and management, dyslipidemia and cardiovascular risks associated with this condition, education on nutrition, review of all medications, review of most recent office visit notes, health maintenance items, prognosis, documentation, and need for follow-up.  Hypothyroidism Clinically euthyroid.  Continue Synthroid 75 mcg daily. TSH level done today.  Dyslipidemia Diet and nutrition discussed.  We will do fasting lipid profile today.  Julie Pacheco was seen today for hypothyroidism.  Diagnoses and all orders for this visit:  Hypothyroidism, unspecified type -     TSH -     Comprehensive metabolic panel  Dyslipidemia -     Comprehensive metabolic panel -     Lipid panel    Patient Instructions   Health Maintenance After Age 20 After age 41, you are at a higher risk for certain long-term diseases and infections as well as injuries from falls. Falls are a major cause of broken bones and head injuries in people who are older than age 81. Getting regular preventive care can help to keep you healthy and well. Preventive care includes getting regular testing and making lifestyle changes as recommended by your health care  provider. Talk with your health care provider about:  Which screenings and tests you should have. A screening is a test that checks for a disease when you have no symptoms.  A diet and exercise plan that is right for you. What should I know about screenings and tests to prevent falls? Screening and testing are the best ways to find a health problem early. Early diagnosis and treatment give you the best chance of managing medical conditions that are common after age 22. Certain conditions and lifestyle choices may make you more likely to have a fall. Your health care provider  may recommend:  Regular vision checks. Poor vision and conditions such as cataracts can make you more likely to have a fall. If you wear glasses, make sure to get your prescription updated if your vision changes.  Medicine review. Work with your health care provider to regularly review all of the medicines you are taking, including over-the-counter medicines. Ask your health care provider about any side effects that may make you more likely to have a fall. Tell your health care provider if any medicines that you take make you feel dizzy or sleepy.  Osteoporosis screening. Osteoporosis is a condition that causes the bones to get weaker. This can make the bones weak and cause them to break more easily.  Blood pressure screening. Blood pressure changes and medicines to control blood pressure can make you feel dizzy.  Strength and balance checks. Your health care provider may recommend certain tests to check your strength and balance while standing, walking, or changing positions.  Foot health exam. Foot pain and numbness, as well as not wearing proper footwear, can make you more likely to have a fall.  Depression screening. You may be more likely to have a fall if you have a fear of falling, feel emotionally low, or feel unable to do activities that you used to do.  Alcohol use screening. Using too much alcohol can affect your  balance and may make you more likely to have a fall. What actions can I take to lower my risk of falls? General instructions  Talk with your health care provider about your risks for falling. Tell your health care provider if: ? You fall. Be sure to tell your health care provider about all falls, even ones that seem minor. ? You feel dizzy, sleepy, or off-balance.  Take over-the-counter and prescription medicines only as told by your health care provider. These include any supplements.  Eat a healthy diet and maintain a healthy weight. A healthy diet includes low-fat dairy products, low-fat (lean) meats, and fiber from whole grains, beans, and lots of fruits and vegetables. Home safety  Remove any tripping hazards, such as rugs, cords, and clutter.  Install safety equipment such as grab bars in bathrooms and safety rails on stairs.  Keep rooms and walkways well-lit. Activity  Follow a regular exercise program to stay fit. This will help you maintain your balance. Ask your health care provider what types of exercise are appropriate for you.  If you need a cane or walker, use it as recommended by your health care provider.  Wear supportive shoes that have nonskid soles.   Lifestyle  Do not drink alcohol if your health care provider tells you not to drink.  If you drink alcohol, limit how much you have: ? 0-1 drink a day for women. ? 0-2 drinks a day for men.  Be aware of how much alcohol is in your drink. In the [place] one drink equals one typical bottle of beer (12 oz), one-half glass of wine (5 oz), or one shot of hard liquor (1 oz).  Do not use any products that contain nicotine or tobacco, such as cigarettes and e-cigarettes. If you need help quitting, ask your health care provider. Summary  Having a healthy lifestyle and getting preventive care can help to protect your health and wellness after age 34.  Screening and testing are the best way to find a health problem early  and help you avoid having a fall. Early diagnosis and treatment give you the best chance for  managing medical conditions that are more common for people who are older than age 32.  Falls are a major cause of broken bones and head injuries in people who are older than age 52. Take precautions to prevent a fall at home.  Work with your health care provider to learn what changes you can make to improve your health and wellness and to prevent falls. This information is not intended to replace advice given to you by your health care provider. Make sure you discuss any questions you have with your health care provider. Document Revised: 09/04/2018 Document Reviewed: 03/27/2017 Elsevier Patient Education  2021 [place], MD [facility]

## 2020-10-19 NOTE — Assessment & Plan Note (Signed)
Clinically euthyroid.  Continue Synthroid 75 mcg daily. TSH level done today.

## 2020-10-19 NOTE — Patient Instructions (Signed)
Health Maintenance After Age 77 After age 77, you are at a higher risk for certain long-term diseases and infections as well as injuries from falls. Falls are a major cause of broken bones and head injuries in people who are older than age 77. Getting regular preventive care can help to keep you healthy and well. Preventive care includes getting regular testing and making lifestyle changes as recommended by your health care provider. Talk with your health care provider about:  Which screenings and tests you should have. A screening is a test that checks for a disease when you have no symptoms.  A diet and exercise plan that is right for you. What should I know about screenings and tests to prevent falls? Screening and testing are the best ways to find a health problem early. Early diagnosis and treatment give you the best chance of managing medical conditions that are common after age 77. Certain conditions and lifestyle choices may make you more likely to have a fall. Your health care provider may recommend:  Regular vision checks. Poor vision and conditions such as cataracts can make you more likely to have a fall. If you wear glasses, make sure to get your prescription updated if your vision changes.  Medicine review. Work with your health care provider to regularly review all of the medicines you are taking, including over-the-counter medicines. Ask your health care provider about any side effects that may make you more likely to have a fall. Tell your health care provider if any medicines that you take make you feel dizzy or sleepy.  Osteoporosis screening. Osteoporosis is a condition that causes the bones to get weaker. This can make the bones weak and cause them to break more easily.  Blood pressure screening. Blood pressure changes and medicines to control blood pressure can make you feel dizzy.  Strength and balance checks. Your health care provider may recommend certain tests to check your  strength and balance while standing, walking, or changing positions.  Foot health exam. Foot pain and numbness, as well as not wearing proper footwear, can make you more likely to have a fall.  Depression screening. You may be more likely to have a fall if you have a fear of falling, feel emotionally low, or feel unable to do activities that you used to do.  Alcohol use screening. Using too much alcohol can affect your balance and may make you more likely to have a fall. What actions can I take to lower my risk of falls? General instructions  Talk with your health care provider about your risks for falling. Tell your health care provider if: ? You fall. Be sure to tell your health care provider about all falls, even ones that seem minor. ? You feel dizzy, sleepy, or off-balance.  Take over-the-counter and prescription medicines only as told by your health care provider. These include any supplements.  Eat a healthy diet and maintain a healthy weight. A healthy diet includes low-fat dairy products, low-fat (lean) meats, and fiber from whole grains, beans, and lots of fruits and vegetables. Home safety  Remove any tripping hazards, such as rugs, cords, and clutter.  Install safety equipment such as grab bars in bathrooms and safety rails on stairs.  Keep rooms and walkways well-lit. Activity  Follow a regular exercise program to stay fit. This will help you maintain your balance. Ask your health care provider what types of exercise are appropriate for you.  If you need a cane or walker,   use it as recommended by your health care provider.  Wear supportive shoes that have nonskid soles.   Lifestyle  Do not drink alcohol if your health care provider tells you not to drink.  If you drink alcohol, limit how much you have: ? 0-1 drink a day for women. ? 0-2 drinks a day for men.  Be aware of how much alcohol is in your drink. In the [place] one drink equals one typical bottle of beer (12  oz), one-half glass of wine (5 oz), or one shot of hard liquor (1 oz).  Do not use any products that contain nicotine or tobacco, such as cigarettes and e-cigarettes. If you need help quitting, ask your health care provider. Summary  Having a healthy lifestyle and getting preventive care can help to protect your health and wellness after age 77.  Screening and testing are the best way to find a health problem early and help you avoid having a fall. Early diagnosis and treatment give you the best chance for managing medical conditions that are more common for people who are older than age 77.  Falls are a major cause of broken bones and head injuries in people who are older than age 77. Take precautions to prevent a fall at home.  Work with your health care provider to learn what changes you can make to improve your health and wellness and to prevent falls. This information is not intended to replace advice given to you by your health care provider. Make sure you discuss any questions you have with your health care provider. Document Revised: 09/04/2018 Document Reviewed: 03/27/2017 Elsevier Patient Education  2021 [employer]  

## 2021-02-17 ENCOUNTER — Other Ambulatory Visit: Payer: Self-pay

## 2021-02-17 ENCOUNTER — Other Ambulatory Visit: Payer: Medicare Other

## 2021-02-17 ENCOUNTER — Ambulatory Visit
Admission: RE | Admit: 2021-02-17 | Discharge: 2021-02-17 | Disposition: A | Discharge status: Home / Self Care | Payer: Medicare Other | Source: Ambulatory Visit | Attending: General Surgery | Admitting: General Surgery

## 2021-02-17 DIAGNOSIS — N6452 Nipple discharge: Secondary | ICD-10-CM

## 2021-02-17 DIAGNOSIS — R922 Inconclusive mammogram: Secondary | ICD-10-CM | POA: Diagnosis not present

## 2021-02-21 ENCOUNTER — Ambulatory Visit: Payer: Medicare Other

## 2021-02-27 ENCOUNTER — Ambulatory Visit: Payer: Medicare Other

## 2021-02-27 ENCOUNTER — Other Ambulatory Visit: Payer: Self-pay

## 2021-02-27 ENCOUNTER — Other Ambulatory Visit: Payer: Self-pay | Admitting: Emergency Medicine

## 2021-02-27 ENCOUNTER — Ambulatory Visit (INDEPENDENT_AMBULATORY_CARE_PROVIDER_SITE_OTHER): Payer: Medicare Other | Admitting: Emergency Medicine

## 2021-02-27 ENCOUNTER — Encounter: Payer: Self-pay | Admitting: Emergency Medicine

## 2021-02-27 DIAGNOSIS — E039 Hypothyroidism, unspecified: Secondary | ICD-10-CM | POA: Diagnosis not present

## 2021-02-27 DIAGNOSIS — Z23 Encounter for immunization: Secondary | ICD-10-CM

## 2021-02-27 DIAGNOSIS — E785 Hyperlipidemia, unspecified: Secondary | ICD-10-CM

## 2021-02-27 MED ORDER — LEVOTHYROXINE SODIUM 75 MCG PO TABS: ORAL_TABLET | ORAL | 3 refills | Status: DC

## 2021-02-27 NOTE — Assessment & Plan Note (Signed)
Lab Results  Component Value Date   CHOL 172 10/19/2020   HDL 54.40 10/19/2020   LDLCALC 102 (H) 10/19/2020   TRIG 81.0 10/19/2020   CHOLHDL 3 10/19/2020  Diet and nutrition discussed.  Stable.

## 2021-02-27 NOTE — Progress Notes (Signed)
Lab Results  Component Value Date   TSH 0.06 (L) 10/19/2020   Julie Pacheco 75 y.o.   Chief Complaint  Patient presents with   Hypothyroidism    F/u    HISTORY OF PRESENT ILLNESS: This is a 75 y.o. female with a history of hypothyroidism on Synthroid 75 mcg here for follow-up. Doing well.  Has no complaints or medical concerns. Requesting shingles shot. Recent mammogram and ultrasound report reviewed with patient.  No sign of malignancy.  HPI   Prior to Admission medications   Medication Sig Start Date End Date Taking? Authorizing Provider  levothyroxine (EUTHYROX) 75 MCG tablet TAKE 1 TABLET BY MOUTH ONCE DAILY BEFORE BREAKFAST 04/11/20  Yes Horald Pollen, MD    No Known Allergies  Patient Active Problem List   Diagnosis Date Noted   Dyslipidemia 10/05/2019   Other microscopic hematuria 09/11/2018   Transient hypertension 09/12/2016   Hypothyroidism 10/31/2015   Vitamin D deficiency 10/31/2015   BMI 28.0-28.9,adult 10/31/2015   Osteopenia 12/03/2014   H/O pheochromocytoma 09/07/2011   Insomnia 09/07/2011    Past Medical History:  Diagnosis Date   Atypical mole 02/05/2011   mild-Right post knee   Migraines    Thyroid disease     Past Surgical History:  Procedure Laterality Date   ABDOMINAL HYSTERECTOMY      Social History   Socioeconomic History   Marital status: Married    Spouse name: Not on file   Number of children: Not on file   Years of education: Not on file   Highest education level: Not on file  Occupational History   Not on file  Tobacco Use   Smoking status: Former    Types: Cigarettes    Quit date: 12/15/1981    Years since quitting: 39.2   Smokeless tobacco: Never  Substance and Sexual Activity   Alcohol use: Yes   Drug use: Never   Sexual activity: Not on file  Other Topics Concern   Not on file  Social History Narrative   Not on file   Social Determinants of Health   Financial Resource Strain: Not on file  Food  Insecurity: Not on file  Transportation Needs: Not on file  Physical Activity: Not on file  Stress: Not on file  Social Connections: Not on file  Intimate Partner Violence: Not on file    Family History  Problem Relation Age of Onset   Breast cancer Maternal Aunt      Review of Systems  Constitutional: Negative.  Negative for chills and fever.  HENT: Negative.  Negative for congestion and sore throat.   Respiratory: Negative.  Negative for cough and shortness of breath.   Cardiovascular:  Negative for chest pain and palpitations.  Gastrointestinal: Negative.  Negative for abdominal pain, diarrhea, nausea and vomiting.  Genitourinary: Negative.  Negative for dysuria and hematuria.  Skin: Negative.  Negative for rash.  Neurological: Negative.  Negative for dizziness and headaches.  All other systems reviewed and are negative.  Today's Vitals   02/27/21 1314  BP: 120/68  Pulse: 97  Temp: 98 F (36.7 C)  TempSrc: Oral  SpO2: 98%  Weight: 154 lb (69.9 kg)  Height: 5\' 2"  (1.575 m)   Body mass index is 28.17 kg/m. Wt Readings from Last 3 Encounters:  02/27/21 154 lb (69.9 kg)  10/19/20 153 lb 3.2 oz (69.5 kg)  04/11/20 153 lb (69.4 kg)    Physical Exam Vitals reviewed.  Constitutional:      Appearance:  Normal appearance.  HENT:     Head: Normocephalic.  Eyes:     Extraocular Movements: Extraocular movements intact.     Conjunctiva/sclera: Conjunctivae normal.     Pupils: Pupils are equal, round, and reactive to light.  Cardiovascular:     Rate and Rhythm: Normal rate and regular rhythm.     Pulses: Normal pulses.     Heart sounds: Normal heart sounds.  Pulmonary:     Effort: Pulmonary effort is normal.     Breath sounds: Normal breath sounds.  Musculoskeletal:     Cervical back: Normal range of motion and neck supple.     Right lower leg: No edema.     Left lower leg: No edema.  Skin:    General: Skin is warm and dry.  Neurological:     General: No focal  deficit present.     Mental Status: She is alert and oriented to person, place, and time.  Psychiatric:        Mood and Affect: Mood normal.        Behavior: Behavior normal.     ASSESSMENT & PLAN: Problem List Items Addressed This Visit       Endocrine   Hypothyroidism    Clinically euthyroid.  Continue Synthroid 75 mcg daily.        Other   Dyslipidemia    Lab Results  Component Value Date   CHOL 172 10/19/2020   HDL 54.40 10/19/2020   LDLCALC 102 (H) 10/19/2020   TRIG 81.0 10/19/2020   CHOLHDL 3 10/19/2020  Diet and nutrition discussed.  Stable.        Patient Instructions  Hypothyroidism Hypothyroidism is when the thyroid gland does not make enough of certain hormones (it is underactive). The thyroid gland is a small gland located in the lower front part of the neck, just in front of the windpipe (trachea). This gland makes hormones that help control how the body uses food for energy (metabolism) as well as how the heart and brain function. These hormones also play a role in keeping your bones strong. When the thyroid is underactive, it produces too little of the hormones thyroxine (T4) and triiodothyronine (T3). What are the causes? This condition may be caused by: Hashimoto's disease. This is a disease in which the body's disease-fighting system (immune system) attacks the thyroid gland. This is the most common cause. Viral infections. Pregnancy. Certain medicines. Birth defects. Past radiation treatments to the head or neck for cancer. Past treatment with radioactive iodine. Past exposure to radiation in the environment. Past surgical removal of part or all of the thyroid. Problems with a gland in the center of the brain (pituitary gland). Lack of enough iodine in the diet. What increases the risk? You are more likely to develop this condition if: You are female. You have a family history of thyroid conditions. You use a medicine called lithium. You take  medicines that affect the immune system (immunosuppressants). What are the signs or symptoms? Symptoms of this condition include: Feeling as though you have no energy (lethargy). Not being able to tolerate cold. Weight gain that is not explained by a change in diet or exercise habits. Lack of appetite. Dry skin. Coarse hair. Menstrual irregularity. Slowing of thought processes. Constipation. Sadness or depression. How is this diagnosed? This condition may be diagnosed based on: Your symptoms, your medical history, and a physical exam. Blood tests. You may also have imaging tests, such as an ultrasound or MRI. How is  this treated? This condition is treated with medicine that replaces the thyroid hormones that your body does not make. After you begin treatment, it may take several weeks for symptoms to go away. Follow these instructions at home: Take over-the-counter and prescription medicines only as told by your health care provider. If you start taking any new medicines, tell your health care provider. Keep all follow-up visits as told by your health care provider. This is important. As your condition improves, your dosage of thyroid hormone medicine may change. You will need to have blood tests regularly so that your health care provider can monitor your condition. Contact a health care provider if: Your symptoms do not get better with treatment. You are taking thyroid hormone replacement medicine and you: Sweat a lot. Have tremors. Feel anxious. Lose weight rapidly. Cannot tolerate heat. Have emotional swings. Have diarrhea. Feel weak. Get help right away if you have: Chest pain. An irregular heartbeat. A rapid heartbeat. Difficulty breathing. Summary Hypothyroidism is when the thyroid gland does not make enough of certain hormones (it is underactive). When the thyroid is underactive, it produces too little of the hormones thyroxine (T4) and triiodothyronine (T3). The  most common cause is Hashimoto's disease, a disease in which the body's disease-fighting system (immune system) attacks the thyroid gland. The condition can also be caused by viral infections, medicine, pregnancy, or past radiation treatment to the head or neck. Symptoms may include weight gain, dry skin, constipation, feeling as though you do not have energy, and not being able to tolerate cold. This condition is treated with medicine to replace the thyroid hormones that your body does not make. This information is not intended to replace advice given to you by your health care provider. Make sure you discuss any questions you have with your health care provider. Document Revised: 02/12/2020 Document Reviewed: 01/28/2020 Elsevier Patient Education  2022 [place], MD [facility]

## 2021-02-27 NOTE — Assessment & Plan Note (Signed)
Clinically euthyroid.  Continue Synthroid 75 mcg daily. 

## 2021-02-27 NOTE — Patient Instructions (Signed)

## 2021-03-08 ENCOUNTER — Other Ambulatory Visit: Payer: Self-pay

## 2021-03-08 ENCOUNTER — Encounter (HOSPITAL_COMMUNITY): Payer: Self-pay | Admitting: *Deleted

## 2021-03-08 ENCOUNTER — Emergency Department (HOSPITAL_COMMUNITY)
Admission: EM | Admit: 2021-03-08 | Discharge: 2021-03-08 | Disposition: A | Discharge status: Home / Self Care | Payer: Medicare Other | Attending: Emergency Medicine | Admitting: Emergency Medicine

## 2021-03-08 DIAGNOSIS — E039 Hypothyroidism, unspecified: Secondary | ICD-10-CM | POA: Insufficient documentation

## 2021-03-08 DIAGNOSIS — Z79899 Other long term (current) drug therapy: Secondary | ICD-10-CM | POA: Diagnosis not present

## 2021-03-08 DIAGNOSIS — I10 Essential (primary) hypertension: Secondary | ICD-10-CM | POA: Insufficient documentation

## 2021-03-08 DIAGNOSIS — R42 Dizziness and giddiness: Secondary | ICD-10-CM | POA: Diagnosis present

## 2021-03-08 DIAGNOSIS — Z87891 Personal history of nicotine dependence: Secondary | ICD-10-CM | POA: Insufficient documentation

## 2021-03-08 LAB — CBC WITH DIFFERENTIAL/PLATELET
Abs Immature Granulocytes: 0.04 10*3/uL (ref 0.00–0.07)
Basophils Absolute: 0.1 10*3/uL (ref 0.0–0.1)
Basophils Relative: 1 %
Eosinophils Absolute: 0.1 10*3/uL (ref 0.0–0.5)
Eosinophils Relative: 1 %
HCT: 43.6 % (ref 36.0–46.0)
Hemoglobin: 14.1 g/dL (ref 12.0–15.0)
Immature Granulocytes: 0 %
Lymphocytes Relative: 24 %
Lymphs Abs: 2.2 10*3/uL (ref 0.7–4.0)
MCH: 31.8 pg (ref 26.0–34.0)
MCHC: 32.3 g/dL (ref 30.0–36.0)
MCV: 98.2 fL (ref 80.0–100.0)
Monocytes Absolute: 0.6 10*3/uL (ref 0.1–1.0)
Monocytes Relative: 6 %
Neutro Abs: 6 10*3/uL (ref 1.7–7.7)
Neutrophils Relative %: 68 %
Platelets: 155 10*3/uL (ref 150–400)
RBC: 4.44 MIL/uL (ref 3.87–5.11)
RDW: 13.3 % (ref 11.5–15.5)
WBC: 9 10*3/uL (ref 4.0–10.5)
nRBC: 0 % (ref 0.0–0.2)

## 2021-03-08 LAB — BASIC METABOLIC PANEL
Anion gap: 6 (ref 5–15)
BUN: 9 mg/dL (ref 8–23)
CO2: 26 mmol/L (ref 22–32)
Calcium: 8.7 mg/dL — ABNORMAL LOW (ref 8.9–10.3)
Chloride: 107 mmol/L (ref 98–111)
Creatinine, Ser: 0.64 mg/dL (ref 0.44–1.00)
GFR, Estimated: >60 mL/min (ref >60)
Glucose, Bld: 88 mg/dL (ref 70–99)
Potassium: 3.9 mmol/L (ref 3.5–5.1)
Sodium: 139 mmol/L (ref 135–145)

## 2021-03-08 LAB — TROPONIN I (HIGH SENSITIVITY): Troponin I (High Sensitivity): 7 ng/L (ref <18)

## 2021-03-08 MED ORDER — HYDRALAZINE HCL 25 MG PO TABS
25.0000 mg | ORAL_TABLET | Freq: Once | ORAL | Status: AC
  Administered 2021-03-08: 25 mg via ORAL

## 2021-03-08 MED ORDER — HYDRALAZINE HCL 10 MG PO TABS: 10.0000 mg | ORAL_TABLET | Freq: Three times a day (TID) | ORAL | 0 refills | Status: DC

## 2021-03-08 MED ORDER — HYDRALAZINE HCL 25 MG PO TABS
25.0000 mg | ORAL_TABLET | Freq: Once | ORAL | Status: DC
Start: 2021-03-08 — End: 2021-03-08
  Filled 2021-03-08: qty 1

## 2021-03-08 NOTE — ED Provider Notes (Signed)
[facility] EMERGENCY DEPARTMENT Provider Note   CSN: [id number] Arrival date & time: 03/08/21  1552     History Chief Complaint  Patient presents with   Dizziness    Julie Pacheco is a 75 y.o. female.  Patient presents chief complaint of lightheadedness dizziness, high blood pressure.  She states 2 days ago she had an episode of vertigo-like symptoms where she felt the room was spinning.  Symptoms resolved yesterday.  She checked her blood pressure yesterday and was 676 systolic.  Today she woke up with dizziness resolved, she checked her blood pressure again today and it was again elevated greater than 195 systolic.  She otherwise denies any headache or chest pain denies abdominal pain no fever no cough no vomiting or diarrhea.  She states that she did receive the shingles vaccine about 4 -5 days ago.      Past Medical History:  Diagnosis Date   Atypical mole 02/05/2011   mild-Right post knee   Migraines    Thyroid disease     Patient Active Problem List   Diagnosis Date Noted   Dyslipidemia 10/05/2019   Other microscopic hematuria 09/11/2018   Transient hypertension 09/12/2016   Hypothyroidism 10/31/2015   Vitamin D deficiency 10/31/2015   BMI 28.0-28.9,adult 10/31/2015   Osteopenia 12/03/2014   H/O pheochromocytoma 09/07/2011   Insomnia 09/07/2011    Past Surgical History:  Procedure Laterality Date   ABDOMINAL HYSTERECTOMY       OB History   No obstetric history on file.     Family History  Problem Relation Age of Onset   Breast cancer Maternal Aunt     Social History   Tobacco Use   Smoking status: Former    Types: Cigarettes    Quit date: 12/15/1981    Years since quitting: 39.2   Smokeless tobacco: Never  Substance Use Topics   Alcohol use: Yes   Drug use: Never    Home Medications Prior to Admission medications   Medication Sig Start Date End Date Taking? Authorizing Provider  hydrALAZINE (APRESOLINE) 10 MG tablet Take 1 tablet (10 mg  total) by mouth 3 (three) times daily for 10 days. 03/08/21 03/18/21 Yes Luna Fuse, MD  levothyroxine (EUTHYROX) 75 MCG tablet TAKE 1 TABLET BY MOUTH ONCE DAILY BEFORE BREAKFAST 02/27/21   Horald Pollen, MD    Allergies    Patient has no known allergies.  Review of Systems   Review of Systems  Constitutional:  Negative for fever.  HENT:  Negative for ear pain.   Eyes:  Negative for pain.  Respiratory:  Negative for cough.   Cardiovascular:  Negative for chest pain.  Gastrointestinal:  Negative for abdominal pain.  Genitourinary:  Negative for flank pain.  Musculoskeletal:  Negative for back pain.  Skin:  Negative for rash.  Neurological:  Negative for headaches.   Physical Exam Updated Vital Signs BP (!) 164/67   Pulse (!) 52   Temp 97.9 F (36.6 C) (Oral)   Resp 20   Ht 5\' 2"  (1.575 m)   Wt 69.9 kg   SpO2 96%   BMI 28.17 kg/m   Physical Exam Constitutional:      General: She is not in acute distress.    Appearance: Normal appearance.  HENT:     Head: Normocephalic.     Nose: Nose normal.  Eyes:     Extraocular Movements: Extraocular movements intact.  Cardiovascular:     Rate and Rhythm: Normal rate.  Pulmonary:  Effort: Pulmonary effort is normal.  Musculoskeletal:        General: Normal range of motion.     Cervical back: Normal range of motion.  Neurological:     General: No focal deficit present.     Mental Status: She is alert and oriented to person, place, and time. Mental status is at baseline.     Cranial Nerves: No cranial nerve deficit.     Motor: No weakness.     Gait: Gait normal.    ED Results / Procedures / Treatments   Labs (all labs ordered are listed, but only abnormal results are displayed) Labs Reviewed  BASIC METABOLIC PANEL - Abnormal; Notable for the following components:      Result Value   Calcium 8.7 (*)    All other components within normal limits  CBC WITH DIFFERENTIAL/PLATELET  TROPONIN I (HIGH SENSITIVITY)     EKG None  Radiology No results found.  Procedures Procedures   Medications Ordered in ED Medications  hydrALAZINE (APRESOLINE) tablet 25 mg (has no administration in time range)    ED Course  I have reviewed the triage vital signs and the nursing notes.  Pertinent labs & imaging results that were available during my care of the patient were reviewed by me and considered in my medical decision making (see chart for details).    MDM Rules/Calculators/A&P                           The patient has no symptoms no pain no discomfort.  Blood pressure was elevated on arrival, repeat appears mildly improved.  Patient given hydralazine as heart rate was in the 40s to 50 bpm range.  Labs and work-up otherwise unremarkable.  Patient started on blood pressure medications here, advised outpatient follow-up with her doctor within the week.  Advised immediate return if she has difficulty breathing chest pain or any additional concerns.  Final Clinical Impression(s) / ED Diagnoses Final diagnoses:  Hypertension, unspecified type    Rx / DC Orders ED Discharge Orders          Ordered    hydrALAZINE (APRESOLINE) 10 MG tablet  3 times daily        03/08/21 2230             Luna Fuse, MD 03/08/21 2231

## 2021-03-08 NOTE — ED Triage Notes (Signed)
Pt woke up with dizziness at 0300 early Tuesday morning, states the room was spinning.  Yesterday morning took her BP and was 177/77, which pt states is high for her.

## 2021-03-08 NOTE — Discharge Instructions (Addendum)
Call your primary care doctor or specialist as discussed in the next 2-3 days.   Return immediately back to the ER if:  Your symptoms worsen within the next 12-24 hours. You develop new symptoms such as new fevers, persistent vomiting, new pain, shortness of breath, or new weakness or numbness, or if you have any other concerns.  

## 2021-03-08 NOTE — ED Triage Notes (Signed)
Pt states she has not felt right since taking shingles shot on Oct. 3.

## 2021-03-09 ENCOUNTER — Telehealth: Payer: Self-pay | Admitting: Emergency Medicine

## 2021-03-09 NOTE — Telephone Encounter (Signed)
[facility] FYI.Julie Pacheco states she had shingle shot on 10/3 and started feeling dizzy yesterday. Caller states she also has pain on left arm from shoulder from neck and blood pressure reading is at 177/73. Caller has dizziness.  Advised to go to ED now.  Patient understood and decided to go to [facility]

## 2021-03-21 ENCOUNTER — Ambulatory Visit (INDEPENDENT_AMBULATORY_CARE_PROVIDER_SITE_OTHER): Payer: Medicare Other | Admitting: Emergency Medicine

## 2021-03-21 ENCOUNTER — Encounter: Payer: Self-pay | Admitting: Emergency Medicine

## 2021-03-21 ENCOUNTER — Other Ambulatory Visit: Payer: Self-pay

## 2021-03-21 VITALS — BP 128/82 | HR 69 | Ht 62.0 in | Wt 155.0 lb

## 2021-03-21 DIAGNOSIS — M25512 Pain in left shoulder: Secondary | ICD-10-CM

## 2021-03-21 DIAGNOSIS — E039 Hypothyroidism, unspecified: Secondary | ICD-10-CM

## 2021-03-21 DIAGNOSIS — R030 Elevated blood-pressure reading, without diagnosis of hypertension: Secondary | ICD-10-CM

## 2021-03-21 MED ORDER — CYCLOBENZAPRINE HCL 5 MG PO TABS: 5.0000 mg | ORAL_TABLET | Freq: Every evening | ORAL | 1 refills | Status: DC | PRN

## 2021-03-21 MED ORDER — HYDROCODONE-ACETAMINOPHEN 5-325 MG PO TABS: 1.0000 | ORAL_TABLET | Freq: Four times a day (QID) | ORAL | 0 refills | Status: DC | PRN

## 2021-03-21 NOTE — Progress Notes (Signed)
Julie Pacheco 75 y.o.   Chief Complaint  Patient presents with   Hypertension    Pt has concerns about her BP    HISTORY OF PRESENT ILLNESS: This is a 75 y.o. female here for follow-up of emergency department visit on 03/08/2021 when she presented with left shoulder pain and elevated blood pressure.  Pain started shortly after getting shingles vaccine on her left arm.  On arrival to the emergency department systolic blood pressure was 180.  Had normal CBC BMP and troponin were normal EKG.  Was given hydralazine 10 mg to take 3 times a day but she is not taking it at present time.  She went home and took an old Vicodin with a muscle relaxant and the next day her pain was much better.  Blood pressure went back to normal.  Patient does not have a history of essential hypertension. Requesting pain medication and muscle relaxant to have at home in case she needs it again. BP Readings from Last 3 Encounters:  03/08/21 (!) 154/68  02/27/21 120/68  10/19/20 130/72     Hypertension Pertinent negatives include no chest pain, headaches, palpitations or shortness of breath.    Prior to Admission medications   Medication Sig Start Date End Date Taking? Authorizing Provider  levothyroxine (EUTHYROX) 75 MCG tablet TAKE 1 TABLET BY MOUTH ONCE DAILY BEFORE BREAKFAST 02/27/21  Yes Alitzel Cookson, Ines Bloomer, MD  hydrALAZINE (APRESOLINE) 10 MG tablet Take 1 tablet (10 mg total) by mouth 3 (three) times daily for 10 days. 03/08/21 03/18/21  Luna Fuse, MD    No Known Allergies  Patient Active Problem List   Diagnosis Date Noted   Dyslipidemia 10/05/2019   Other microscopic hematuria 09/11/2018   Transient hypertension 09/12/2016   Hypothyroidism 10/31/2015   Vitamin D deficiency 10/31/2015   BMI 28.0-28.9,adult 10/31/2015   Osteopenia 12/03/2014   H/O pheochromocytoma 09/07/2011   Insomnia 09/07/2011    Past Medical History:  Diagnosis Date   Atypical mole 02/05/2011   mild-Right post knee    Migraines    Thyroid disease     Past Surgical History:  Procedure Laterality Date   ABDOMINAL HYSTERECTOMY      Social History   Socioeconomic History   Marital status: Married    Spouse name: Not on file   Number of children: Not on file   Years of education: Not on file   Highest education level: Not on file  Occupational History   Not on file  Tobacco Use   Smoking status: Former    Types: Cigarettes    Quit date: 12/15/1981    Years since quitting: 39.2   Smokeless tobacco: Never  Substance and Sexual Activity   Alcohol use: Yes   Drug use: Never   Sexual activity: Not on file  Other Topics Concern   Not on file  Social History Narrative   Not on file   Social Determinants of Health   Financial Resource Strain: Not on file  Food Insecurity: Not on file  Transportation Needs: Not on file  Physical Activity: Not on file  Stress: Not on file  Social Connections: Not on file  Intimate Partner Violence: Not on file    Family History  Problem Relation Age of Onset   Breast cancer Maternal Aunt      Review of Systems  Constitutional: Negative.  Negative for chills and fever.  HENT: Negative.  Negative for congestion and sore throat.   Respiratory: Negative.  Negative for cough and  shortness of breath.   Cardiovascular: Negative.  Negative for chest pain and palpitations.  Gastrointestinal: Negative.  Negative for abdominal pain, diarrhea, nausea and vomiting.  Genitourinary: Negative.   Musculoskeletal:  Positive for joint pain (Left shoulder).  Skin: Negative.  Negative for rash.  Neurological:  Negative for dizziness and headaches.  All other systems reviewed and are negative.   Physical Exam Vitals reviewed.  Constitutional:      Appearance: Normal appearance.  HENT:     Head: Normocephalic.  Eyes:     Extraocular Movements: Extraocular movements intact.     Pupils: Pupils are equal, round, and reactive to light.  Cardiovascular:     Rate  and Rhythm: Normal rate and regular rhythm.     Pulses: Normal pulses.     Heart sounds: Normal heart sounds.  Pulmonary:     Effort: Pulmonary effort is normal.     Breath sounds: Normal breath sounds.  Musculoskeletal:        General: Normal range of motion.     Cervical back: Normal range of motion and neck supple. No tenderness.     Comments: Left shoulder: Full range of motion but complaining of some discomfort. No other significant findings.  Skin:    General: Skin is warm and dry.     Capillary Refill: Capillary refill takes less than 2 seconds.  Neurological:     General: No focal deficit present.     Mental Status: She is alert and oriented to person, place, and time.  Psychiatric:        Mood and Affect: Mood normal.        Behavior: Behavior normal.     ASSESSMENT & PLAN: Clinically stable.  No red flag signs or symptoms.  Stable left shoulder. Normotensive.  No history of hypertension.  Hypertensive episode triggered by pain.  No need for antihypertensive medication.  Problem List Items Addressed This Visit       Cardiovascular and Mediastinum   Transient hypertension     Endocrine   Hypothyroidism   Other Visit Diagnoses     Acute pain of left shoulder    -  Primary   Relevant Medications   HYDROcodone-acetaminophen (NORCO) 5-325 MG tablet   cyclobenzaprine (FLEXERIL) 5 MG tablet        Patient Instructions  Health Maintenance After Age 77 After age 68, you are at a higher risk for certain long-term diseases and infections as well as injuries from falls. Falls are a major cause of broken bones and head injuries in people who are older than age 46. Getting regular preventive care can help to keep you healthy and well. Preventive care includes getting regular testing and making lifestyle changes as recommended by your health care provider. Talk with your health care provider about: Which screenings and tests you should have. A screening is a test that  checks for a disease when you have no symptoms. A diet and exercise plan that is right for you. What should I know about screenings and tests to prevent falls? Screening and testing are the best ways to find a health problem early. Early diagnosis and treatment give you the best chance of managing medical conditions that are common after age 49. Certain conditions and lifestyle choices may make you more likely to have a fall. Your health care provider may recommend: Regular vision checks. Poor vision and conditions such as cataracts can make you more likely to have a fall. If you wear glasses, make  sure to get your prescription updated if your vision changes. Medicine review. Work with your health care provider to regularly review all of the medicines you are taking, including over-the-counter medicines. Ask your health care provider about any side effects that may make you more likely to have a fall. Tell your health care provider if any medicines that you take make you feel dizzy or sleepy. Osteoporosis screening. Osteoporosis is a condition that causes the bones to get weaker. This can make the bones weak and cause them to break more easily. Blood pressure screening. Blood pressure changes and medicines to control blood pressure can make you feel dizzy. Strength and balance checks. Your health care provider may recommend certain tests to check your strength and balance while standing, walking, or changing positions. Foot health exam. Foot pain and numbness, as well as not wearing proper footwear, can make you more likely to have a fall. Depression screening. You may be more likely to have a fall if you have a fear of falling, feel emotionally low, or feel unable to do activities that you used to do. Alcohol use screening. Using too much alcohol can affect your balance and may make you more likely to have a fall. What actions can I take to lower my risk of falls? General instructions Talk with your  health care provider about your risks for falling. Tell your health care provider if: You fall. Be sure to tell your health care provider about all falls, even ones that seem minor. You feel dizzy, sleepy, or off-balance. Take over-the-counter and prescription medicines only as told by your health care provider. These include any supplements. Eat a healthy diet and maintain a healthy weight. A healthy diet includes low-fat dairy products, low-fat (lean) meats, and fiber from whole grains, beans, and lots of fruits and vegetables. Home safety Remove any tripping hazards, such as rugs, cords, and clutter. Install safety equipment such as grab bars in bathrooms and safety rails on stairs. Keep rooms and walkways well-lit. Activity  Follow a regular exercise program to stay fit. This will help you maintain your balance. Ask your health care provider what types of exercise are appropriate for you. If you need a cane or walker, use it as recommended by your health care provider. Wear supportive shoes that have nonskid soles. Lifestyle Do not drink alcohol if your health care provider tells you not to drink. If you drink alcohol, limit how much you have: 0-1 drink a day for women. 0-2 drinks a day for men. Be aware of how much alcohol is in your drink. In the [place] one drink equals one typical bottle of beer (12 oz), one-half glass of wine (5 oz), or one shot of hard liquor (1 oz). Do not use any products that contain nicotine or tobacco, such as cigarettes and e-cigarettes. If you need help quitting, ask your health care provider. Summary Having a healthy lifestyle and getting preventive care can help to protect your health and wellness after age 68. Screening and testing are the best way to find a health problem early and help you avoid having a fall. Early diagnosis and treatment give you the best chance for managing medical conditions that are more common for people who are older than age  62. Falls are a major cause of broken bones and head injuries in people who are older than age 67. Take precautions to prevent a fall at home. Work with your health care provider to learn what changes you can  make to improve your health and wellness and to prevent falls. This information is not intended to replace advice given to you by your health care provider. Make sure you discuss any questions you have with your health care provider. Document Revised: 07/22/2020 Document Reviewed: 04/29/2020 Elsevier Patient Education  2022 [place], MD [facility]

## 2021-03-21 NOTE — Patient Instructions (Signed)
Health Maintenance After Age 77 After age 77, you are at a higher risk for certain long-term diseases and infections as well as injuries from falls. Falls are a major cause of broken bones and head injuries in people who are older than age 77. Getting regular preventive care can help to keep you healthy and well. Preventive care includes getting regular testing and making lifestyle changes as recommended by your health care provider. Talk with your health care provider about: Which screenings and tests you should have. A screening is a test that checks for a disease when you have no symptoms. A diet and exercise plan that is right for you. What should I know about screenings and tests to prevent falls? Screening and testing are the best ways to find a health problem early. Early diagnosis and treatment give you the best chance of managing medical conditions that are common after age 77. Certain conditions and lifestyle choices may make you more likely to have a fall. Your health care provider may recommend: Regular vision checks. Poor vision and conditions such as cataracts can make you more likely to have a fall. If you wear glasses, make sure to get your prescription updated if your vision changes. Medicine review. Work with your health care provider to regularly review all of the medicines you are taking, including over-the-counter medicines. Ask your health care provider about any side effects that may make you more likely to have a fall. Tell your health care provider if any medicines that you take make you feel dizzy or sleepy. Osteoporosis screening. Osteoporosis is a condition that causes the bones to get weaker. This can make the bones weak and cause them to break more easily. Blood pressure screening. Blood pressure changes and medicines to control blood pressure can make you feel dizzy. Strength and balance checks. Your health care provider may recommend certain tests to check your strength and  balance while standing, walking, or changing positions. Foot health exam. Foot pain and numbness, as well as not wearing proper footwear, can make you more likely to have a fall. Depression screening. You may be more likely to have a fall if you have a fear of falling, feel emotionally low, or feel unable to do activities that you used to do. Alcohol use screening. Using too much alcohol can affect your balance and may make you more likely to have a fall. What actions can I take to lower my risk of falls? General instructions Talk with your health care provider about your risks for falling. Tell your health care provider if: You fall. Be sure to tell your health care provider about all falls, even ones that seem minor. You feel dizzy, sleepy, or off-balance. Take over-the-counter and prescription medicines only as told by your health care provider. These include any supplements. Eat a healthy diet and maintain a healthy weight. A healthy diet includes low-fat dairy products, low-fat (lean) meats, and fiber from whole grains, beans, and lots of fruits and vegetables. Home safety Remove any tripping hazards, such as rugs, cords, and clutter. Install safety equipment such as grab bars in bathrooms and safety rails on stairs. Keep rooms and walkways well-lit. Activity  Follow a regular exercise program to stay fit. This will help you maintain your balance. Ask your health care provider what types of exercise are appropriate for you. If you need a cane or walker, use it as recommended by your health care provider. Wear supportive shoes that have nonskid soles. Lifestyle Do not   drink alcohol if your health care provider tells you not to drink. If you drink alcohol, limit how much you have: 0-1 drink a day for women. 0-2 drinks a day for men. Be aware of how much alcohol is in your drink. In the [place] one drink equals one typical bottle of beer (12 oz), one-half glass of wine (5 oz), or one shot of  hard liquor (1 oz). Do not use any products that contain nicotine or tobacco, such as cigarettes and e-cigarettes. If you need help quitting, ask your health care provider. Summary Having a healthy lifestyle and getting preventive care can help to protect your health and wellness after age 77. Screening and testing are the best way to find a health problem early and help you avoid having a fall. Early diagnosis and treatment give you the best chance for managing medical conditions that are more common for people who are older than age 77. Falls are a major cause of broken bones and head injuries in people who are older than age 77. Take precautions to prevent a fall at home. Work with your health care provider to learn what changes you can make to improve your health and wellness and to prevent falls. This information is not intended to replace advice given to you by your health care provider. Make sure you discuss any questions you have with your health care provider. Document Revised: 07/22/2020 Document Reviewed: 04/29/2020 Elsevier Patient Education  2022 Elsevier Inc.  

## 2021-04-24 ENCOUNTER — Ambulatory Visit: Payer: Medicare Other | Admitting: Emergency Medicine

## 2021-05-27 ENCOUNTER — Other Ambulatory Visit: Payer: Self-pay | Admitting: Emergency Medicine

## 2021-05-27 DIAGNOSIS — E039 Hypothyroidism, unspecified: Secondary | ICD-10-CM

## 2021-05-31 ENCOUNTER — Ambulatory Visit: Payer: Medicare Other

## 2021-07-27 ENCOUNTER — Telehealth: Payer: Self-pay | Admitting: Emergency Medicine

## 2021-07-27 NOTE — Telephone Encounter (Signed)
Pt wants to make provider aware she is very upset that she was charged $295.00 for her shingle vac ? ?Pt states she asked the person who administered the vac the cost and was told it was "free" ? ?Informed pt w/ medicare the shingle vac is costly if administered at your doctor's office and little to no cost if administered at the local pharmacy  ? ? ?

## 2021-07-30 NOTE — Telephone Encounter (Signed)
Thanks

## 2021-08-22 ENCOUNTER — Other Ambulatory Visit: Payer: Self-pay | Admitting: Emergency Medicine

## 2021-08-22 DIAGNOSIS — E039 Hypothyroidism, unspecified: Secondary | ICD-10-CM

## 2021-08-25 ENCOUNTER — Ambulatory Visit (INDEPENDENT_AMBULATORY_CARE_PROVIDER_SITE_OTHER): Payer: Medicare Other

## 2021-08-25 DIAGNOSIS — Z0000 Encounter for general adult medical examination without abnormal findings: Secondary | ICD-10-CM

## 2021-08-25 NOTE — Progress Notes (Signed)
? ?Subjective:  ? Julie Pacheco is a 76 y.o. female who presents for Medicare Annual (Subsequent) preventive examination. ? ? ?I connected with Julie Pacheco today by telephone and verified that I am speaking with the correct person using two identifiers. ?Location patient: home ?Location provider: work ?Persons participating in the virtual visit: patient, provider. ?  ?I discussed the limitations, risks, security and privacy concerns of performing an evaluation and management service by telephone and the availability of in person appointments. I also discussed with the patient that there may be a patient responsible charge related to this service. The patient expressed understanding and verbally consented to this telephonic visit.  ?  ?Interactive audio and video telecommunications were attempted between this provider and patient, however failed, due to patient having technical difficulties OR patient did not have access to video capability.  We continued and completed visit with audio only. ? ?  ?Review of Systems    ? ?Cardiac Risk Factors include: advanced age (>42mn, >>60women) ? ?   ?Objective:  ?  ?Today's Vitals  ? ?There is no height or weight on file to calculate BMI. ? ? ?  08/25/2021  ?  3:08 PM 09/25/2019  ?  9:45 AM 10/29/2018  ? 10:08 PM 07/23/2018  ?  9:50 AM  ?Advanced Directives  ?Does Patient Have a Medical Advance Directive? Yes No No Yes  ?Type of [place]   Living will;Healthcare Power of Attorney  ?Does patient want to make changes to medical advance directive?    Yes (ED - Information included in AVS)  ?[place] of [place]? No - copy requested   No - copy requested  ?Would patient like information on creating a medical advance directive?   No - Guardian declined   ? ? ?Current Medications (verified) ?Outpatient Encounter Medications as of 08/25/2021  ?Medication Sig  ? cyclobenzaprine (FLEXERIL) 5 MG tablet Take 1  tablet (5 mg total) by mouth at bedtime as needed for muscle spasms.  ? levothyroxine (SYNTHROID) 75 MCG tablet TAKE 1 TABLET BY MOUTH ONCE DAILY BEFORE BREAKFAST  ? HYDROcodone-acetaminophen (NORCO) 5-325 MG tablet Take 1 tablet by mouth every 6 (six) hours as needed for moderate pain. (Patient not taking: Reported on 08/25/2021)  ? ?No facility-administered encounter medications on file as of 08/25/2021.  ? ? ?Allergies (verified) ?Patient has no known allergies.  ? ?History: ?Past Medical History:  ?Diagnosis Date  ? Atypical mole 02/05/2011  ? mild-Right post knee  ? Migraines   ? Thyroid disease   ? ?Past Surgical History:  ?Procedure Laterality Date  ? ABDOMINAL HYSTERECTOMY    ? ?Family History  ?Problem Relation Age of Onset  ? Breast cancer Maternal Aunt   ? ?Social History  ? ?Socioeconomic History  ? Marital status: Married  ?  Spouse name: Not on file  ? Number of children: Not on file  ? Years of education: Not on file  ? Highest education level: Not on file  ?Occupational History  ? Not on file  ?Tobacco Use  ? Smoking status: Former  ?  Types: Cigarettes  ?  Quit date: 12/15/1981  ?  Years since quitting: 39.7  ? Smokeless tobacco: Never  ?Substance and Sexual Activity  ? Alcohol use: Yes  ? Drug use: Never  ? Sexual activity: Not on file  ?Other Topics Concern  ? Not on file  ?Social History Narrative  ? Not on file  ? ?  Social Determinants of Health  ? ?Financial Resource Strain: Low Risk   ? Difficulty of Paying Living Expenses: Not hard at all  ?Food Insecurity: No Food Insecurity  ? Worried About Charity fundraiser in the Last Year: Never true  ? Ran Out of Food in the Last Year: Never true  ?Transportation Needs: No Transportation Needs  ? Lack of Transportation (Medical): No  ? Lack of Transportation (Non-Medical): No  ?Physical Activity: Sufficiently Active  ? Days of Exercise per Week: 4 days  ? Minutes of Exercise per Session: 60 min  ?Stress: No Stress Concern Present  ? Feeling of Stress :  Not at all  ?Social Connections: Moderately Integrated  ? Frequency of Communication with Friends and Family: Twice a week  ? Frequency of Social Gatherings with Friends and Family: Twice a week  ? Attends Religious Services: Never  ? Active Member of Clubs or Organizations: Yes  ? Attends Archivist Meetings: More than 4 times per year  ? Marital Status: Married  ? ? ?Tobacco Counseling ?Counseling given: Not Answered ? ? ?Clinical Intake: ? ?Pre-visit preparation completed: Yes ? ?Pain : No/denies pain ? ?  ? ?Nutritional Risks: None ?Diabetes: No ? ?How often do you need to have someone help you when you read instructions, pamphlets, or other written materials from your doctor or pharmacy?: 1 - Never ?What is the last grade level you completed in school?: High School ? ?Diabetic?no ? ?Interpreter Needed?: No ? ?Information entered by :: [email or web address] ? ? ?Activities of Daily Living ? ?  08/25/2021  ?  3:11 PM  ?In your present state of health, do you have any difficulty performing the following activities:  ?Hearing? 0  ?Vision? 0  ?Difficulty concentrating or making decisions? 0  ?Walking or climbing stairs? 0  ?Dressing or bathing? 0  ?Doing errands, shopping? 0  ?Preparing Food and eating ? N  ?Using the Toilet? N  ?In the past six months, have you accidently leaked urine? N  ?Do you have problems with loss of bowel control? N  ?Managing your Medications? N  ?Managing your Finances? N  ?Housekeeping or managing your Housekeeping? N  ? ? ?Patient Care Team: ?Horald Pollen, MD as PCP - General (Internal Medicine) ?Lavonna Monarch, MD as Consulting Physician (Dermatology) ?Okey Regal, OD as Consulting Physician (Optometry) ? ?Indicate any recent Medical Services you may have received from other than [facility] providers in the past year (date may be approximate). ? ?   ?Assessment:  ? This is a routine wellness examination for Julie Pacheco. ? ?Hearing/Vision screen ?Vision Screening - Comments:: Annual eye  exams wears glasses  ? ?Dietary issues and exercise activities discussed: ?Current Exercise Habits: Home exercise routine, Type of exercise: walking, Time (Minutes): 20, Frequency (Times/Week): 2, Weekly Exercise (Minutes/Week): 40, Intensity: Mild, Exercise limited by: None identified ? ? Goals Addressed   ?None ?  ? ?Depression Screen ? ?  08/25/2021  ?  3:09 PM 03/21/2021  ? 12:59 PM 02/27/2021  ?  1:08 PM 10/19/2020  ? 10:40 AM 04/11/2020  ?  8:30 AM 10/05/2019  ? 10:24 AM 09/25/2019  ?  9:41 AM  ?PHQ 2/9 Scores  ?PHQ - 2 Score 0 0 0 0 0 0 0  ?  ?Fall Risk ? ?  08/25/2021  ?  3:09 PM 03/21/2021  ? 12:58 PM 02/27/2021  ?  1:08 PM 10/19/2020  ? 10:40 AM 04/11/2020  ?  8:30 AM  ?Fall Risk   ?  Falls in the past year? 0 0 0 0 0  ?Number falls in past yr: 0 0 0 0   ?Injury with Fall? 0 0 0 0   ?Follow up Falls evaluation completed    Falls evaluation completed  ? ? ?FALL RISK PREVENTION PERTAINING TO THE HOME: ? ?Any stairs in or around the home? Yes  ?If so, are there any without handrails? No  ?Home free of loose throw rugs in walkways, pet beds, electrical cords, etc? Yes  ?Adequate lighting in your home to reduce risk of falls? Yes  ? ?ASSISTIVE DEVICES UTILIZED TO PREVENT FALLS: ? ?Life alert? No  ?Use of a cane, walker or w/c? No  ?Grab bars in the bathroom? No  ?Shower chair or bench in shower? No  ?Elevated toilet seat or a handicapped toilet? No  ? ? ?Cognitive Function: ? Normal cognitive status assessed by telephone conversation  by this Nurse Health Advisor. No abnormalities found.  ? ?  ? ?  09/25/2019  ?  9:37 AM 07/23/2018  ?  9:55 AM  ?6CIT Screen  ?What Year? 0 points 0 points  ?What month? 0 points 0 points  ?What time? 0 points 0 points  ?Count back from 20 0 points 0 points  ?Months in reverse 0 points 0 points  ?Repeat phrase 0 points 0 points  ?Total Score 0 points 0 points  ? ? ?Immunizations ?Immunization History  ?Administered Date(s) Administered  ? PFIZER(Purple Top)SARS-COV-2 Vaccination 07/04/2019,  07/29/2019  ? Zoster Recombinat (Shingrix) 02/27/2021  ? ? ?TDAP status: Due, Education has been provided regarding the importance of this vaccine. Advised may receive this vaccine at local pharmacy or Healt

## 2021-08-25 NOTE — Patient Instructions (Signed)
Julie Pacheco , ?Thank you for taking time to come for your Medicare Wellness Visit. I appreciate your ongoing commitment to your health goals. Please review the following plan we discussed and let me know if I can assist you in the future.  ? ?Screening recommendations/referrals: ?Colonoscopy: no longer required  ?Mammogram: no longer required  ?Bone Density: 10/20/2014 ?Recommended yearly ophthalmology/optometry visit for glaucoma screening and checkup ?Recommended yearly dental visit for hygiene and checkup ? ?Vaccinations: ?Influenza vaccine: declined  ?Pneumococcal vaccine: declined  ?Tdap vaccine: due  ?Shingles vaccine: declined    ? ?Advanced directives: yes  ? ?Conditions/risks identified: none  ? ?Next appointment: none  ? ? ?Preventive Care 78 Years and Older, Female ?Preventive care refers to lifestyle choices and visits with your health care provider that can promote health and wellness. ?What does preventive care include? ?A yearly physical exam. This is also called an annual well check. ?Dental exams once or twice a year. ?Routine eye exams. Ask your health care provider how often you should have your eyes checked. ?Personal lifestyle choices, including: ?Daily care of your teeth and gums. ?Regular physical activity. ?Eating a healthy diet. ?Avoiding tobacco and drug use. ?Limiting alcohol use. ?Practicing safe sex. ?Taking low-dose aspirin every day. ?Taking vitamin and mineral supplements as recommended by your health care provider. ?What happens during an annual well check? ?The services and screenings done by your health care provider during your annual well check will depend on your age, overall health, lifestyle risk factors, and family history of disease. ?Counseling  ?Your health care provider may ask you questions about your: ?Alcohol use. ?Tobacco use. ?Drug use. ?Emotional well-being. ?Home and relationship well-being. ?Sexual activity. ?Eating habits. ?History of falls. ?Memory and ability to  understand (cognition). ?Work and work Statistician. ?Reproductive health. ?Screening  ?You may have the following tests or measurements: ?Height, weight, and BMI. ?Blood pressure. ?Lipid and cholesterol levels. These may be checked every 5 years, or more frequently if you are over 51 years old. ?Skin check. ?Lung cancer screening. You may have this screening every year starting at age 78 if you have a 30-pack-year history of smoking and currently smoke or have quit within the past 15 years. ?Fecal occult blood test (FOBT) of the stool. You may have this test every year starting at age 78. ?Flexible sigmoidoscopy or colonoscopy. You may have a sigmoidoscopy every 5 years or a colonoscopy every 10 years starting at age 78. ?Hepatitis C blood test. ?Hepatitis B blood test. ?Sexually transmitted disease (STD) testing. ?Diabetes screening. This is done by checking your blood sugar (glucose) after you have not eaten for a while (fasting). You may have this done every 1-3 years. ?Bone density scan. This is done to screen for osteoporosis. You may have this done starting at age 78. ?Mammogram. This may be done every 1-2 years. Talk to your health care provider about how often you should have regular mammograms. ?Talk with your health care provider about your test results, treatment options, and if necessary, the need for more tests. ?Vaccines  ?Your health care provider may recommend certain vaccines, such as: ?Influenza vaccine. This is recommended every year. ?Tetanus, diphtheria, and acellular pertussis (Tdap, Td) vaccine. You may need a Td booster every 10 years. ?Zoster vaccine. You may need this after age 78. ?Pneumococcal 13-valent conjugate (PCV13) vaccine. One dose is recommended after age 78. ?Pneumococcal polysaccharide (PPSV23) vaccine. One dose is recommended after age 78. ?Talk to your health care provider about which screenings and  vaccines you need and how often you need them. ?This information is not  intended to replace advice given to you by your health care provider. Make sure you discuss any questions you have with your health care provider. ?Document Released: 06/10/2015 Document Revised: 02/01/2016 Document Reviewed: 03/15/2015 ?Elsevier Interactive Patient Education ? 2017 [place]. ? ?Fall Prevention in the Home ?Falls can cause injuries. They can happen to people of all ages. There are many things you can do to make your home safe and to help prevent falls. ?What can I do on the outside of my home? ?Regularly fix the edges of walkways and driveways and fix any cracks. ?Remove anything that might make you trip as you walk through a door, such as a raised step or threshold. ?Trim any bushes or trees on the path to your home. ?Use bright outdoor lighting. ?Clear any walking paths of anything that might make someone trip, such as rocks or tools. ?Regularly check to see if handrails are loose or broken. Make sure that both sides of any steps have handrails. ?Any raised decks and porches should have guardrails on the edges. ?Have any leaves, snow, or ice cleared regularly. ?Use sand or salt on walking paths during winter. ?Clean up any spills in your garage right away. This includes oil or grease spills. ?What can I do in the bathroom? ?Use night lights. ?Install grab bars by the toilet and in the tub and shower. Do not use towel bars as grab bars. ?Use non-skid mats or decals in the tub or shower. ?If you need to sit down in the shower, use a plastic, non-slip stool. ?Keep the floor dry. Clean up any water that spills on the floor as soon as it happens. ?Remove soap buildup in the tub or shower regularly. ?Attach bath mats securely with double-sided non-slip rug tape. ?Do not have throw rugs and other things on the floor that can make you trip. ?What can I do in the bedroom? ?Use night lights. ?Make sure that you have a light by your bed that is easy to reach. ?Do not use any sheets or blankets that are  too big for your bed. They should not hang down onto the floor. ?Have a firm chair that has side arms. You can use this for support while you get dressed. ?Do not have throw rugs and other things on the floor that can make you trip. ?What can I do in the kitchen? ?Clean up any spills right away. ?Avoid walking on wet floors. ?Keep items that you use a lot in easy-to-reach places. ?If you need to reach something above you, use a strong step stool that has a grab bar. ?Keep electrical cords out of the way. ?Do not use floor polish or wax that makes floors slippery. If you must use wax, use non-skid floor wax. ?Do not have throw rugs and other things on the floor that can make you trip. ?What can I do with my stairs? ?Do not leave any items on the stairs. ?Make sure that there are handrails on both sides of the stairs and use them. Fix handrails that are broken or loose. Make sure that handrails are as long as the stairways. ?Check any carpeting to make sure that it is firmly attached to the stairs. Fix any carpet that is loose or worn. ?Avoid having throw rugs at the top or bottom of the stairs. If you do have throw rugs, attach them to the floor with carpet tape. ?Make  sure that you have a light switch at the top of the stairs and the bottom of the stairs. If you do not have them, ask someone to add them for you. ?What else can I do to help prevent falls? ?Wear shoes that: ?Do not have high heels. ?Have rubber bottoms. ?Are comfortable and fit you well. ?Are closed at the toe. Do not wear sandals. ?If you use a stepladder: ?Make sure that it is fully opened. Do not climb a closed stepladder. ?Make sure that both sides of the stepladder are locked into place. ?Ask someone to hold it for you, if possible. ?Clearly mark and make sure that you can see: ?Any grab bars or handrails. ?First and last steps. ?Where the edge of each step is. ?Use tools that help you move around (mobility aids) if they are needed. These  include: ?Canes. ?Walkers. ?Scooters. ?Crutches. ?Turn on the lights when you go into a dark area. Replace any light bulbs as soon as they burn out. ?Set up your furniture so you have a clear path. Avoid moving yo

## 2021-08-28 ENCOUNTER — Encounter: Payer: Self-pay | Admitting: Emergency Medicine

## 2021-08-28 ENCOUNTER — Ambulatory Visit (INDEPENDENT_AMBULATORY_CARE_PROVIDER_SITE_OTHER): Payer: Medicare Other | Admitting: Emergency Medicine

## 2021-08-28 VITALS — BP 122/72 | HR 50 | Temp 97.5°F | Ht 62.0 in | Wt 155.2 lb

## 2021-08-28 DIAGNOSIS — E039 Hypothyroidism, unspecified: Secondary | ICD-10-CM | POA: Diagnosis not present

## 2021-08-28 LAB — COMPREHENSIVE METABOLIC PANEL
ALT: 28 U/L (ref 0–35)
AST: 29 U/L (ref 0–37)
Albumin: 4 g/dL (ref 3.5–5.2)
Alkaline Phosphatase: 59 U/L (ref 39–117)
BUN: 10 mg/dL (ref 6–23)
CO2: 28 mEq/L (ref 19–32)
Calcium: 9 mg/dL (ref 8.4–10.5)
Chloride: 108 mEq/L (ref 96–112)
Creatinine, Ser: 0.75 mg/dL (ref 0.40–1.20)
GFR: 76.7 mL/min (ref >60.00)
Glucose, Bld: 90 mg/dL (ref 70–99)
Potassium: 4.5 mEq/L (ref 3.5–5.1)
Sodium: 142 mEq/L (ref 135–145)
Total Bilirubin: 0.6 mg/dL (ref 0.2–1.2)
Total Protein: 6.9 g/dL (ref 6.0–8.3)

## 2021-08-28 LAB — LIPID PANEL
Cholesterol: 172 mg/dL (ref 0–200)
HDL: 53 mg/dL (ref >39.00)
LDL Cholesterol: 101 mg/dL — ABNORMAL HIGH (ref 0–99)
NonHDL: 119.26
Total CHOL/HDL Ratio: 3
Triglycerides: 89 mg/dL (ref 0.0–149.0)
VLDL: 17.8 mg/dL (ref 0.0–40.0)

## 2021-08-28 LAB — TSH: TSH: 0.27 u[IU]/mL — ABNORMAL LOW (ref 0.35–5.50)

## 2021-08-28 NOTE — Assessment & Plan Note (Signed)
Clinically euthyroid.  TSH done today. Continue Synthroid 75 mcg daily. 

## 2021-08-28 NOTE — Patient Instructions (Signed)
Health Maintenance After Age 78 After age 78, you are at a higher risk for certain long-term diseases and infections as well as injuries from falls. Falls are a major cause of broken bones and head injuries in people who are older than age 78. Getting regular preventive care can help to keep you healthy and well. Preventive care includes getting regular testing and making lifestyle changes as recommended by your health care provider. Talk with your health care provider about: Which screenings and tests you should have. A screening is a test that checks for a disease when you have no symptoms. A diet and exercise plan that is right for you. What should I know about screenings and tests to prevent falls? Screening and testing are the best ways to find a health problem early. Early diagnosis and treatment give you the best chance of managing medical conditions that are common after age 78. Certain conditions and lifestyle choices may make you more likely to have a fall. Your health care provider may recommend: Regular vision checks. Poor vision and conditions such as cataracts can make you more likely to have a fall. If you wear glasses, make sure to get your prescription updated if your vision changes. Medicine review. Work with your health care provider to regularly review all of the medicines you are taking, including over-the-counter medicines. Ask your health care provider about any side effects that may make you more likely to have a fall. Tell your health care provider if any medicines that you take make you feel dizzy or sleepy. Strength and balance checks. Your health care provider may recommend certain tests to check your strength and balance while standing, walking, or changing positions. Foot health exam. Foot pain and numbness, as well as not wearing proper footwear, can make you more likely to have a fall. Screenings, including: Osteoporosis screening. Osteoporosis is a condition that causes  the bones to get weaker and break more easily. Blood pressure screening. Blood pressure changes and medicines to control blood pressure can make you feel dizzy. Depression screening. You may be more likely to have a fall if you have a fear of falling, feel depressed, or feel unable to do activities that you used to do. Alcohol use screening. Using too much alcohol can affect your balance and may make you more likely to have a fall. Follow these instructions at home: Lifestyle Do not drink alcohol if: Your health care provider tells you not to drink. If you drink alcohol: Limit how much you have to: 0-1 drink a day for women. 0-2 drinks a day for men. Know how much alcohol is in your drink. In the [place] one drink equals one 12 oz bottle of beer (355 mL), one 5 oz glass of wine (148 mL), or one 1 oz glass of hard liquor (44 mL). Do not use any products that contain nicotine or tobacco. These products include cigarettes, chewing tobacco, and vaping devices, such as e-cigarettes. If you need help quitting, ask your health care provider. Activity  Follow a regular exercise program to stay fit. This will help you maintain your balance. Ask your health care provider what types of exercise are appropriate for you. If you need a cane or walker, use it as recommended by your health care provider. Wear supportive shoes that have nonskid soles. Safety  Remove any tripping hazards, such as rugs, cords, and clutter. Install safety equipment such as grab bars in bathrooms and safety rails on stairs. Keep rooms and walkways   well-lit. General instructions Talk with your health care provider about your risks for falling. Tell your health care provider if: You fall. Be sure to tell your health care provider about all falls, even ones that seem minor. You feel dizzy, tiredness (fatigue), or off-balance. Take over-the-counter and prescription medicines only as told by your health care provider. These include  supplements. Eat a healthy diet and maintain a healthy weight. A healthy diet includes low-fat dairy products, low-fat (lean) meats, and fiber from whole grains, beans, and lots of fruits and vegetables. Stay current with your vaccines. Schedule regular health, dental, and eye exams. Summary Having a healthy lifestyle and getting preventive care can help to protect your health and wellness after age 78. Screening and testing are the best way to find a health problem early and help you avoid having a fall. Early diagnosis and treatment give you the best chance for managing medical conditions that are more common for people who are older than age 78. Falls are a major cause of broken bones and head injuries in people who are older than age 78. Take precautions to prevent a fall at home. Work with your health care provider to learn what changes you can make to improve your health and wellness and to prevent falls. This information is not intended to replace advice given to you by your health care provider. Make sure you discuss any questions you have with your health care provider. Document Revised: 10/03/2020 Document Reviewed: 10/03/2020 Elsevier Patient Education  2022 Elsevier Inc.  

## 2021-08-28 NOTE — Progress Notes (Signed)
Julie Pacheco ?76 y.o. ? ? ?Chief Complaint  ?Patient presents with  ? Follow-up  ?  No concerns   ? ? ?HISTORY OF PRESENT ILLNESS: ?This is a 76 y.o. female with history of hypothyroidism on Synthroid 75 mcg daily, here for follow-up. ?Doing well.  Has no complaints or medical concerns today. ? ?HPI ? ? ?Prior to Admission medications   ?Medication Sig Start Date End Date Taking? Authorizing Provider  ?cyclobenzaprine (FLEXERIL) 5 MG tablet Take 1 tablet (5 mg total) by mouth at bedtime as needed for muscle spasms. 03/21/21   Horald Pollen, MD  ?HYDROcodone-acetaminophen [facility]) 5-325 MG tablet Take 1 tablet by mouth every 6 (six) hours as needed for moderate pain. ?Patient not taking: Reported on 08/25/2021 03/21/21   Horald Pollen, MD  ?levothyroxine (SYNTHROID) 75 MCG tablet TAKE 1 TABLET BY MOUTH ONCE DAILY BEFORE BREAKFAST 08/22/21   Horald Pollen, MD  ? ? ?No Known Allergies ? ?Patient Active Problem List  ? Diagnosis Date Noted  ? Dyslipidemia 10/05/2019  ? Other microscopic hematuria 09/11/2018  ? Transient hypertension 09/12/2016  ? Hypothyroidism 10/31/2015  ? Vitamin D deficiency 10/31/2015  ? BMI 28.0-28.9,adult 10/31/2015  ? Osteopenia 12/03/2014  ? H/O pheochromocytoma 09/07/2011  ? Insomnia 09/07/2011  ? ? ?Past Medical History:  ?Diagnosis Date  ? Atypical mole 02/05/2011  ? mild-Right post knee  ? Migraines   ? Thyroid disease   ? ? ?Past Surgical History:  ?Procedure Laterality Date  ? ABDOMINAL HYSTERECTOMY    ? ? ?Social History  ? ?Socioeconomic History  ? Marital status: Married  ?  Spouse name: Not on file  ? Number of children: Not on file  ? Years of education: Not on file  ? Highest education level: Not on file  ?Occupational History  ? Not on file  ?Tobacco Use  ? Smoking status: Former  ?  Types: Cigarettes  ?  Quit date: 12/15/1981  ?  Years since quitting: 39.7  ? Smokeless tobacco: Never  ?Substance and Sexual Activity  ? Alcohol use: Yes  ? Drug use: Never  ?  Sexual activity: Not on file  ?Other Topics Concern  ? Not on file  ?Social History Narrative  ? Not on file  ? ?Social Determinants of Health  ? ?Financial Resource Strain: Low Risk   ? Difficulty of Paying Living Expenses: Not hard at all  ?Food Insecurity: No Food Insecurity  ? Worried About Charity fundraiser in the Last Year: Never true  ? Ran Out of Food in the Last Year: Never true  ?Transportation Needs: No Transportation Needs  ? Lack of Transportation (Medical): No  ? Lack of Transportation (Non-Medical): No  ?Physical Activity: Sufficiently Active  ? Days of Exercise per Week: 4 days  ? Minutes of Exercise per Session: 60 min  ?Stress: No Stress Concern Present  ? Feeling of Stress : Not at all  ?Social Connections: Moderately Integrated  ? Frequency of Communication with Friends and Family: Twice a week  ? Frequency of Social Gatherings with Friends and Family: Twice a week  ? Attends Religious Services: Never  ? Active Member of Clubs or Organizations: Yes  ? Attends Archivist Meetings: More than 4 times per year  ? Marital Status: Married  ?Intimate Partner Violence: Not At Risk  ? Fear of Current or Ex-Partner: No  ? Emotionally Abused: No  ? Physically Abused: No  ? Sexually Abused: No  ? ? ?Family History  ?  Problem Relation Age of Onset  ? Breast cancer Maternal Aunt   ? ? ? ?Review of Systems  ?Constitutional: Negative.  Negative for chills and fever.  ?HENT: Negative.  Negative for congestion and sore throat.   ?Respiratory: Negative.  Negative for cough and shortness of breath.   ?Cardiovascular: Negative.  Negative for chest pain and palpitations.  ?Gastrointestinal:  Negative for abdominal pain, diarrhea, nausea and vomiting.  ?Genitourinary: Negative.  Negative for dysuria and hematuria.  ?Skin: Negative.  Negative for rash.  ?Neurological:  Negative for dizziness and headaches.  ?All other systems reviewed and are negative. ? ?Today's Vitals  ? 08/28/21 1307  ?Weight: 155 lb 4 oz  (70.4 kg)  ?Height: '5\' 2"'$  (1.575 m)  ? ?Body mass index is 28.4 kg/m?. ?Wt Readings from Last 3 Encounters:  ?08/28/21 155 lb 4 oz (70.4 kg)  ?03/21/21 155 lb (70.3 kg)  ?03/08/21 154 lb (69.9 kg)  ? ?Lab Results  ?Component Value Date  ? TSH 0.06 (L) 10/19/2020  ? ?Today's Vitals  ? 08/28/21 1307  ?BP: 122/72  ?Pulse: (!) 50  ?Temp: (!) 97.5 ?F (36.4 ?C)  ?TempSrc: Oral  ?SpO2: 96%  ?Weight: 155 lb 4 oz (70.4 kg)  ?Height: '5\' 2"'$  (1.575 m)  ? ?Body mass index is 28.4 kg/m?. ? ? ?Physical Exam ?Vitals reviewed.  ?Constitutional:   ?   Appearance: Normal appearance.  ?HENT:  ?   Head: Normocephalic.  ?   Mouth/Throat:  ?   Mouth: Mucous membranes are moist.  ?   Pharynx: Oropharynx is clear.  ?Eyes:  ?   Extraocular Movements: Extraocular movements intact.  ?   Conjunctiva/sclera: Conjunctivae normal.  ?   Pupils: Pupils are equal, round, and reactive to light.  ?Cardiovascular:  ?   Rate and Rhythm: Regular rhythm. Bradycardia present.  ?   Heart sounds: Normal heart sounds.  ?Pulmonary:  ?   Effort: Pulmonary effort is normal.  ?   Breath sounds: Normal breath sounds.  ?Musculoskeletal:     ?   General: Normal range of motion.  ?   Cervical back: No tenderness.  ?Lymphadenopathy:  ?   Cervical: No cervical adenopathy.  ?Skin: ?   General: Skin is warm and dry.  ?Neurological:  ?   General: No focal deficit present.  ?   Mental Status: She is alert and oriented to person, place, and time.  ?Psychiatric:     ?   Mood and Affect: Mood normal.     ?   Behavior: Behavior normal.  ? ? ? ?ASSESSMENT & PLAN: ?A total of 30 minutes was spent with the patient and counseling/coordination of care regarding preparing for this visit, review of most recent office visit notes, review of most recent blood work results, review of all medications, education on nutrition, prognosis, documentation and need for follow-up. ? ?Problem List Items Addressed This Visit   ? ?  ? Endocrine  ? Hypothyroidism - Primary  ?  Clinically euthyroid.   TSH done today. ?Continue Synthroid 75 mcg daily. ?  ?  ? Relevant Orders  ? TSH  ? Lipid panel  ? Comprehensive metabolic panel  ? ?Patient Instructions  ?Health Maintenance After Age 45 ?After age 97, you are at a higher risk for certain long-term diseases and infections as well as injuries from falls. Falls are a major cause of broken bones and head injuries in people who are older than age 5. Getting regular preventive care can help to  keep you healthy and well. Preventive care includes getting regular testing and making lifestyle changes as recommended by your health care provider. Talk with your health care provider about: ?Which screenings and tests you should have. A screening is a test that checks for a disease when you have no symptoms. ?A diet and exercise plan that is right for you. ?What should I know about screenings and tests to prevent falls? ?Screening and testing are the best ways to find a health problem early. Early diagnosis and treatment give you the best chance of managing medical conditions that are common after age 56. Certain conditions and lifestyle choices may make you more likely to have a fall. Your health care provider may recommend: ?Regular vision checks. Poor vision and conditions such as cataracts can make you more likely to have a fall. If you wear glasses, make sure to get your prescription updated if your vision changes. ?Medicine review. Work with your health care provider to regularly review all of the medicines you are taking, including over-the-counter medicines. Ask your health care provider about any side effects that may make you more likely to have a fall. Tell your health care provider if any medicines that you take make you feel dizzy or sleepy. ?Strength and balance checks. Your health care provider may recommend certain tests to check your strength and balance while standing, walking, or changing positions. ?Foot health exam. Foot pain and numbness, as well as not  wearing proper footwear, can make you more likely to have a fall. ?Screenings, including: ?Osteoporosis screening. Osteoporosis is a condition that causes the bones to get weaker and break more easily. ?

## 2021-09-27 ENCOUNTER — Ambulatory Visit: Payer: Medicare Other | Admitting: Dermatology

## 2021-09-27 ENCOUNTER — Encounter: Payer: Self-pay | Admitting: Dermatology

## 2021-09-27 ENCOUNTER — Ambulatory Visit (INDEPENDENT_AMBULATORY_CARE_PROVIDER_SITE_OTHER): Payer: Medicare Other | Admitting: Dermatology

## 2021-09-27 DIAGNOSIS — L821 Other seborrheic keratosis: Secondary | ICD-10-CM

## 2021-09-27 DIAGNOSIS — D1801 Hemangioma of skin and subcutaneous tissue: Secondary | ICD-10-CM | POA: Diagnosis not present

## 2021-09-27 DIAGNOSIS — D3617 Benign neoplasm of peripheral nerves and autonomic nervous system of trunk, unspecified: Secondary | ICD-10-CM | POA: Diagnosis not present

## 2021-09-27 DIAGNOSIS — D3610 Benign neoplasm of peripheral nerves and autonomic nervous system, unspecified: Secondary | ICD-10-CM

## 2021-09-27 DIAGNOSIS — R202 Paresthesia of skin: Secondary | ICD-10-CM | POA: Diagnosis not present

## 2021-09-27 DIAGNOSIS — Z1283 Encounter for screening for malignant neoplasm of skin: Secondary | ICD-10-CM

## 2021-10-15 ENCOUNTER — Encounter: Payer: Self-pay | Admitting: Dermatology

## 2021-10-15 NOTE — Progress Notes (Signed)
   Follow-Up Visit   Subjective  Julie Pacheco is a 76 y.o. female who presents for the following: Annual Exam (No new conerns).  Skin examination, several areas to check Location:  Duration:  Quality:  Associated Signs/Symptoms: Modifying Factors:  Severity:  Timing: Context:   Objective  Well appearing patient in no apparent distress; mood and affect are within normal limits. General skin check: No atypical pigmented lesions or nonmelanoma skin cancer  Mid Back Recurring pruritus upper back without visible rash or growth with neurogenic pruritus.  Right Upper Back Soft partially compressible light pink 7 mm dermal papule, dermoscopy amorphous  Left Thigh - Anterior, Left Upper Back, Scalp Several 5 to 8 mm brown textured flattopped papules, compatible dermoscopy  Right Abdomen (side) - Upper, Right Breast Multiple 2 mm smooth red dermal papules    A full examination was performed including scalp, head, eyes, ears, nose, lips, neck, chest, axillae, abdomen, back, buttocks, bilateral upper extremities, bilateral lower extremities, hands, feet, fingers, toes, fingernails, and toenails. All findings within normal limits unless otherwise noted below.  Areas beneath undergarments not fully examined   Assessment & Plan    Notalgia paresthetica Mid Back  We will look for any over-the-counter lotion containing the ingredient pramoxine which can be used on an as needed basis  Neurofibroma Right Upper Back  Leave if stable  Seborrheic keratosis (3) Left Thigh - Anterior; Left Upper Back; Scalp  Leave if stable  Screening for malignant neoplasm of skin  Annual skin examination  Hemangioma of skin (2) Right Breast; Right Abdomen (side) - Upper  No intervention necessary      I, Lavonna Monarch, MD, have reviewed all documentation for this visit.  The documentation on 10/15/21 for the exam, diagnosis, procedures, and orders are all accurate and complete.

## 2021-11-01 ENCOUNTER — Other Ambulatory Visit: Payer: Self-pay | Admitting: Emergency Medicine

## 2021-11-01 DIAGNOSIS — N6452 Nipple discharge: Secondary | ICD-10-CM

## 2021-11-01 DIAGNOSIS — Z09 Encounter for follow-up examination after completed treatment for conditions other than malignant neoplasm: Secondary | ICD-10-CM

## 2021-11-14 ENCOUNTER — Other Ambulatory Visit: Payer: Self-pay | Admitting: Emergency Medicine

## 2021-11-14 DIAGNOSIS — E039 Hypothyroidism, unspecified: Secondary | ICD-10-CM

## 2021-12-19 ENCOUNTER — Encounter: Payer: Self-pay | Admitting: Internal Medicine

## 2021-12-19 ENCOUNTER — Ambulatory Visit (INDEPENDENT_AMBULATORY_CARE_PROVIDER_SITE_OTHER): Payer: Medicare Other | Admitting: Internal Medicine

## 2021-12-19 DIAGNOSIS — M542 Cervicalgia: Secondary | ICD-10-CM

## 2021-12-19 MED ORDER — PREDNISONE 20 MG PO TABS: 40.0000 mg | ORAL_TABLET | Freq: Every day | ORAL | 0 refills | Status: DC

## 2021-12-19 MED ORDER — CYCLOBENZAPRINE HCL 5 MG PO TABS: 5.0000 mg | ORAL_TABLET | Freq: Three times a day (TID) | ORAL | 0 refills | Status: DC | PRN

## 2021-12-19 NOTE — Progress Notes (Signed)
   Subjective:   Patient ID: Julie Pacheco, female    DOB: 1946-04-29, 76 y.o.   MRN: [id number]  HPI The patient is a 76 YO female coming in for a few days of neck pain. No trigger may have slept wrong.  Review of Systems  Constitutional: Negative.   HENT: Negative.    Eyes: Negative.   Respiratory:  Negative for cough, chest tightness and shortness of breath.   Cardiovascular:  Negative for chest pain, palpitations and leg swelling.  Gastrointestinal:  Negative for abdominal distention, abdominal pain, constipation, diarrhea, nausea and vomiting.  Musculoskeletal:  Positive for neck pain.  Skin: Negative.   Neurological: Negative.   Psychiatric/Behavioral: Negative.      Objective:  Physical Exam Constitutional:      Appearance: She is well-developed.  HENT:     Head: Normocephalic and atraumatic.  Cardiovascular:     Rate and Rhythm: Normal rate and regular rhythm.  Pulmonary:     Effort: Pulmonary effort is normal. No respiratory distress.     Breath sounds: Normal breath sounds. No wheezing or rales.  Abdominal:     General: Bowel sounds are normal. There is no distension.     Palpations: Abdomen is soft.     Tenderness: There is no abdominal tenderness. There is no rebound.  Musculoskeletal:        General: Tenderness present.     Cervical back: Normal range of motion.  Skin:    General: Skin is warm and dry.  Neurological:     Mental Status: She is alert and oriented to person, place, and time.     Coordination: Coordination normal.     Vitals:   12/19/21 1549  BP: 122/72  Pulse: (!) 54  Resp: 18  SpO2: 96%  Weight: 155 lb 9.6 oz (70.6 kg)  Height: '5\' 2"'$  (1.575 m)    Assessment & Plan:

## 2021-12-19 NOTE — Patient Instructions (Signed)
We have sent in cyclobenzaprine to use up to 3 times a day which is a muscle relaxer.  We have sent in prednisone to take 2 pills daily for 5 days to help calm this down.

## 2021-12-22 DIAGNOSIS — M542 Cervicalgia: Secondary | ICD-10-CM | POA: Insufficient documentation

## 2021-12-22 NOTE — Assessment & Plan Note (Signed)
Suspect muscular strain. Rx prednisone 5 day course and cyclobenzaprine for relief 5 mg TID prn in the meantime. Advised it is okay to use tylenol prn otc as well.

## 2022-01-01 DIAGNOSIS — H2513 Age-related nuclear cataract, bilateral: Secondary | ICD-10-CM | POA: Diagnosis not present

## 2022-02-19 ENCOUNTER — Other Ambulatory Visit: Payer: Self-pay | Admitting: Emergency Medicine

## 2022-02-19 ENCOUNTER — Ambulatory Visit
Admission: RE | Admit: 2022-02-19 | Discharge: 2022-02-19 | Disposition: A | Discharge status: Home / Self Care | Payer: Medicare Other | Source: Ambulatory Visit | Attending: Emergency Medicine | Admitting: Emergency Medicine

## 2022-02-19 DIAGNOSIS — D241 Benign neoplasm of right breast: Secondary | ICD-10-CM | POA: Diagnosis not present

## 2022-02-19 DIAGNOSIS — N6452 Nipple discharge: Secondary | ICD-10-CM

## 2022-02-19 DIAGNOSIS — Z09 Encounter for follow-up examination after completed treatment for conditions other than malignant neoplasm: Secondary | ICD-10-CM

## 2022-02-19 DIAGNOSIS — R921 Mammographic calcification found on diagnostic imaging of breast: Secondary | ICD-10-CM | POA: Diagnosis not present

## 2022-02-21 ENCOUNTER — Other Ambulatory Visit: Payer: Self-pay | Admitting: Emergency Medicine

## 2022-02-21 ENCOUNTER — Telehealth: Payer: Self-pay | Admitting: *Deleted

## 2022-02-21 DIAGNOSIS — F418 Other specified anxiety disorders: Secondary | ICD-10-CM

## 2022-02-21 MED ORDER — DIAZEPAM 5 MG PO TABS: 5.0000 mg | ORAL_TABLET | Freq: Two times a day (BID) | ORAL | 0 refills | Status: DC | PRN

## 2022-02-21 NOTE — Progress Notes (Signed)
New prescription for Valium 5 mg sent to pharmacy of record.  Thanks

## 2022-02-21 NOTE — Telephone Encounter (Signed)
PA for Valium submitted, awaiting response [id number]

## 2022-02-22 NOTE — Telephone Encounter (Signed)
PA approved for Valium

## 2022-02-27 ENCOUNTER — Ambulatory Visit (INDEPENDENT_AMBULATORY_CARE_PROVIDER_SITE_OTHER): Payer: Medicare Other | Admitting: Emergency Medicine

## 2022-02-27 ENCOUNTER — Encounter: Payer: Self-pay | Admitting: Emergency Medicine

## 2022-02-27 VITALS — BP 122/72 | HR 55 | Temp 97.8°F | Ht 62.0 in | Wt 155.0 lb

## 2022-02-27 DIAGNOSIS — E785 Hyperlipidemia, unspecified: Secondary | ICD-10-CM | POA: Diagnosis not present

## 2022-02-27 DIAGNOSIS — F418 Other specified anxiety disorders: Secondary | ICD-10-CM | POA: Diagnosis not present

## 2022-02-27 DIAGNOSIS — N6310 Unspecified lump in the right breast, unspecified quadrant: Secondary | ICD-10-CM

## 2022-02-27 DIAGNOSIS — E039 Hypothyroidism, unspecified: Secondary | ICD-10-CM

## 2022-02-27 NOTE — Assessment & Plan Note (Signed)
Recommend to schedule breast biopsy as instructed.

## 2022-02-27 NOTE — Assessment & Plan Note (Signed)
Clinically euthyroid.  Continue Synthroid 75 mcg daily. 

## 2022-02-27 NOTE — Progress Notes (Signed)
Julie Pacheco 76 y.o.   No chief complaint on file.   HISTORY OF PRESENT ILLNESS: This is a 76 y.o. female here for follow-up of breast lump.  Needs to schedule biopsy. Situational anxiety.  Recently prescribed Valium by me. History of hypothyroidism.  Doing well. No other complaints or medical concerns today. Lab Results  Component Value Date   TSH 0.27 (L) 08/28/2021  IMPRESSION: 1. New suspicious calcifications in the retroareolar and medial aspects of the right breast.   2.  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   3.  No mammographic evidence of malignancy in the left breast.   RECOMMENDATION: Stereotactic core needle biopsy x2 of the right breast.   I have discussed the findings and recommendations with the patient. The patient is unsure if she wants to proceed with biopsy at this time. Our scheduler will contact her tomorrow to arrange the biopsy appointment if she agrees.   BI-RADS CATEGORY  4: Suspicious.     Electronically Signed   By: Audie Pinto M.D.   On: 02/19/2022 16:37   HPI   Prior to Admission medications   Medication Sig Start Date End Date Taking? Authorizing Provider  levothyroxine (SYNTHROID) 75 MCG tablet TAKE 1 TABLET BY MOUTH ONCE DAILY BEFORE BREAKFAST 11/14/21  Yes Petrita Blunck, Ines Bloomer, MD  diazepam (VALIUM) 5 MG tablet Take 1 tablet (5 mg total) by mouth every 12 (twelve) hours as needed for anxiety. Patient not taking: Reported on 02/27/2022 02/21/22   Horald Pollen, MD    No Known Allergies  Patient Active Problem List   Diagnosis Date Noted   Dyslipidemia 10/05/2019   Other microscopic hematuria 09/11/2018   Transient hypertension 09/12/2016   Hypothyroidism 10/31/2015   Vitamin D deficiency 10/31/2015   BMI 28.0-28.9,adult 10/31/2015   Osteopenia 12/03/2014   H/O pheochromocytoma 09/07/2011   Insomnia 09/07/2011    Past Medical History:  Diagnosis Date   Atypical mole 02/05/2011    mild-Right post knee   Migraines    Thyroid disease     Past Surgical History:  Procedure Laterality Date   ABDOMINAL HYSTERECTOMY      Social History   Socioeconomic History   Marital status: Married    Spouse name: Not on file   Number of children: Not on file   Years of education: Not on file   Highest education level: Not on file  Occupational History   Not on file  Tobacco Use   Smoking status: Former    Types: Cigarettes    Quit date: 12/15/1981    Years since quitting: 40.2   Smokeless tobacco: Never  Substance and Sexual Activity   Alcohol use: Yes   Drug use: Never   Sexual activity: Not on file  Other Topics Concern   Not on file  Social History Narrative   Not on file   Social Determinants of Health   Financial Resource Strain: Low Risk  (08/25/2021)   Overall Financial Resource Strain (CARDIA)    Difficulty of Paying Living Expenses: Not hard at all  Food Insecurity: No Food Insecurity (08/25/2021)   Hunger Vital Sign    Worried About Running Out of Food in the Last Year: Never true    Ran Out of Food in the Last Year: Never true  Transportation Needs: No Transportation Needs (08/25/2021)   PRAPARE - Hydrologist (Medical): No    Lack of Transportation (Non-Medical): No  Physical Activity: Sufficiently Active (  08/25/2021)   Exercise Vital Sign    Days of Exercise per Week: 4 days    Minutes of Exercise per Session: 60 min  Stress: No Stress Concern Present (08/25/2021)   [place]    Feeling of Stress : Not at all  Social Connections: Moderately Integrated (08/25/2021)   Social Connection and Isolation Panel [NHANES]    Frequency of Communication with Friends and Family: Twice a week    Frequency of Social Gatherings with Friends and Family: Twice a week    Attends Religious Services: Never    Marine scientist or Organizations: Yes    Attends Programme researcher, broadcasting/film/video: More than 4 times per year    Marital Status: Married  Human resources officer Violence: Not At Risk (08/25/2021)   Humiliation, Afraid, Rape, and Kick questionnaire    Fear of Current or Ex-Partner: No    Emotionally Abused: No    Physically Abused: No    Sexually Abused: No    Family History  Problem Relation Age of Onset   Breast cancer Maternal Aunt      Review of Systems  Constitutional: Negative.  Negative for chills and fever.  HENT: Negative.  Negative for congestion and sore throat.   Respiratory: Negative.  Negative for cough and shortness of breath.   Cardiovascular:  Negative for palpitations.  Gastrointestinal:  Negative for abdominal pain, nausea and vomiting.  Genitourinary: Negative.  Negative for dysuria and hematuria.  Skin: Negative.  Negative for rash.  Neurological:  Negative for dizziness and headaches.  All other systems reviewed and are negative.  Today's Vitals   02/27/22 0959  BP: 122/72  Pulse: (!) 55  Temp: 97.8 F (36.6 C)  TempSrc: Oral  SpO2: 100%  Weight: 155 lb (70.3 kg)  Height: '5\' 2"'$  (1.575 m)   Body mass index is 28.35 kg/m.   Physical Exam Vitals reviewed.  Constitutional:      Appearance: Normal appearance.  HENT:     Head: Normocephalic.  Eyes:     Extraocular Movements: Extraocular movements intact.  Cardiovascular:     Rate and Rhythm: Normal rate.  Pulmonary:     Effort: Pulmonary effort is normal.  Skin:    General: Skin is warm and dry.  Neurological:     General: No focal deficit present.     Mental Status: She is alert and oriented to person, place, and time.  Psychiatric:        Mood and Affect: Mood normal.        Behavior: Behavior normal.      ASSESSMENT & PLAN: Problem List Items Addressed This Visit       Endocrine   Hypothyroidism - Primary    Clinically euthyroid. Continue Synthroid 75 mcg daily.        Other   Dyslipidemia    Normal lipid profile without  medications. Diet and nutrition discussed.      Mass of right breast    Recommend to schedule breast biopsy as instructed.      Situational anxiety    May take Valium 5 mg every 8-10 hours as needed.      Patient Instructions  Health Maintenance After Age 56 After age 67, you are at a higher risk for certain long-term diseases and infections as well as injuries from falls. Falls are a major cause of broken bones and head injuries in people who are older than age 78. Getting regular  preventive care can help to keep you healthy and well. Preventive care includes getting regular testing and making lifestyle changes as recommended by your health care provider. Talk with your health care provider about: Which screenings and tests you should have. A screening is a test that checks for a disease when you have no symptoms. A diet and exercise plan that is right for you. What should I know about screenings and tests to prevent falls? Screening and testing are the best ways to find a health problem early. Early diagnosis and treatment give you the best chance of managing medical conditions that are common after age 75. Certain conditions and lifestyle choices may make you more likely to have a fall. Your health care provider may recommend: Regular vision checks. Poor vision and conditions such as cataracts can make you more likely to have a fall. If you wear glasses, make sure to get your prescription updated if your vision changes. Medicine review. Work with your health care provider to regularly review all of the medicines you are taking, including over-the-counter medicines. Ask your health care provider about any side effects that may make you more likely to have a fall. Tell your health care provider if any medicines that you take make you feel dizzy or sleepy. Strength and balance checks. Your health care provider may recommend certain tests to check your strength and balance while standing,  walking, or changing positions. Foot health exam. Foot pain and numbness, as well as not wearing proper footwear, can make you more likely to have a fall. Screenings, including: Osteoporosis screening. Osteoporosis is a condition that causes the bones to get weaker and break more easily. Blood pressure screening. Blood pressure changes and medicines to control blood pressure can make you feel dizzy. Depression screening. You may be more likely to have a fall if you have a fear of falling, feel depressed, or feel unable to do activities that you used to do. Alcohol use screening. Using too much alcohol can affect your balance and may make you more likely to have a fall. Follow these instructions at home: Lifestyle Do not drink alcohol if: Your health care provider tells you not to drink. If you drink alcohol: Limit how much you have to: 0-1 drink a day for women. 0-2 drinks a day for men. Know how much alcohol is in your drink. In the [place] one drink equals one 12 oz bottle of beer (355 mL), one 5 oz glass of wine (148 mL), or one 1 oz glass of hard liquor (44 mL). Do not use any products that contain nicotine or tobacco. These products include cigarettes, chewing tobacco, and vaping devices, such as e-cigarettes. If you need help quitting, ask your health care provider. Activity  Follow a regular exercise program to stay fit. This will help you maintain your balance. Ask your health care provider what types of exercise are appropriate for you. If you need a cane or walker, use it as recommended by your health care provider. Wear supportive shoes that have nonskid soles. Safety  Remove any tripping hazards, such as rugs, cords, and clutter. Install safety equipment such as grab bars in bathrooms and safety rails on stairs. Keep rooms and walkways well-lit. General instructions Talk with your health care provider about your risks for falling. Tell your health care provider if: You fall.  Be sure to tell your health care provider about all falls, even ones that seem minor. You feel dizzy, tiredness (fatigue), or off-balance. Take  over-the-counter and prescription medicines only as told by your health care provider. These include supplements. Eat a healthy diet and maintain a healthy weight. A healthy diet includes low-fat dairy products, low-fat (lean) meats, and fiber from whole grains, beans, and lots of fruits and vegetables. Stay current with your vaccines. Schedule regular health, dental, and eye exams. Summary Having a healthy lifestyle and getting preventive care can help to protect your health and wellness after age 52. Screening and testing are the best way to find a health problem early and help you avoid having a fall. Early diagnosis and treatment give you the best chance for managing medical conditions that are more common for people who are older than age 44. Falls are a major cause of broken bones and head injuries in people who are older than age 51. Take precautions to prevent a fall at home. Work with your health care provider to learn what changes you can make to improve your health and wellness and to prevent falls. This information is not intended to replace advice given to you by your health care provider. Make sure you discuss any questions you have with your health care provider. Document Revised: 10/03/2020 Document Reviewed: 10/03/2020 Elsevier Patient Education  [place], MD [facility]

## 2022-02-27 NOTE — Assessment & Plan Note (Signed)
Normal lipid profile without medications. Diet and nutrition discussed.

## 2022-02-27 NOTE — Assessment & Plan Note (Signed)
May take Valium 5 mg every 8-10 hours as needed.

## 2022-02-27 NOTE — Patient Instructions (Signed)
Health Maintenance After Age 78 After age 78, you are at a higher risk for certain long-term diseases and infections as well as injuries from falls. Falls are a major cause of broken bones and head injuries in people who are older than age 78. Getting regular preventive care can help to keep you healthy and well. Preventive care includes getting regular testing and making lifestyle changes as recommended by your health care provider. Talk with your health care provider about: Which screenings and tests you should have. A screening is a test that checks for a disease when you have no symptoms. A diet and exercise plan that is right for you. What should I know about screenings and tests to prevent falls? Screening and testing are the best ways to find a health problem early. Early diagnosis and treatment give you the best chance of managing medical conditions that are common after age 78. Certain conditions and lifestyle choices may make you more likely to have a fall. Your health care provider may recommend: Regular vision checks. Poor vision and conditions such as cataracts can make you more likely to have a fall. If you wear glasses, make sure to get your prescription updated if your vision changes. Medicine review. Work with your health care provider to regularly review all of the medicines you are taking, including over-the-counter medicines. Ask your health care provider about any side effects that may make you more likely to have a fall. Tell your health care provider if any medicines that you take make you feel dizzy or sleepy. Strength and balance checks. Your health care provider may recommend certain tests to check your strength and balance while standing, walking, or changing positions. Foot health exam. Foot pain and numbness, as well as not wearing proper footwear, can make you more likely to have a fall. Screenings, including: Osteoporosis screening. Osteoporosis is a condition that causes  the bones to get weaker and break more easily. Blood pressure screening. Blood pressure changes and medicines to control blood pressure can make you feel dizzy. Depression screening. You may be more likely to have a fall if you have a fear of falling, feel depressed, or feel unable to do activities that you used to do. Alcohol use screening. Using too much alcohol can affect your balance and may make you more likely to have a fall. Follow these instructions at home: Lifestyle Do not drink alcohol if: Your health care provider tells you not to drink. If you drink alcohol: Limit how much you have to: 0-1 drink a day for women. 0-2 drinks a day for men. Know how much alcohol is in your drink. In the [place] one drink equals one 12 oz bottle of beer (355 mL), one 5 oz glass of wine (148 mL), or one 1 oz glass of hard liquor (44 mL). Do not use any products that contain nicotine or tobacco. These products include cigarettes, chewing tobacco, and vaping devices, such as e-cigarettes. If you need help quitting, ask your health care provider. Activity  Follow a regular exercise program to stay fit. This will help you maintain your balance. Ask your health care provider what types of exercise are appropriate for you. If you need a cane or walker, use it as recommended by your health care provider. Wear supportive shoes that have nonskid soles. Safety  Remove any tripping hazards, such as rugs, cords, and clutter. Install safety equipment such as grab bars in bathrooms and safety rails on stairs. Keep rooms and walkways   well-lit. General instructions Talk with your health care provider about your risks for falling. Tell your health care provider if: You fall. Be sure to tell your health care provider about all falls, even ones that seem minor. You feel dizzy, tiredness (fatigue), or off-balance. Take over-the-counter and prescription medicines only as told by your health care provider. These include  supplements. Eat a healthy diet and maintain a healthy weight. A healthy diet includes low-fat dairy products, low-fat (lean) meats, and fiber from whole grains, beans, and lots of fruits and vegetables. Stay current with your vaccines. Schedule regular health, dental, and eye exams. Summary Having a healthy lifestyle and getting preventive care can help to protect your health and wellness after age 78. Screening and testing are the best way to find a health problem early and help you avoid having a fall. Early diagnosis and treatment give you the best chance for managing medical conditions that are more common for people who are older than age 78. Falls are a major cause of broken bones and head injuries in people who are older than age 78. Take precautions to prevent a fall at home. Work with your health care provider to learn what changes you can make to improve your health and wellness and to prevent falls. This information is not intended to replace advice given to you by your health care provider. Make sure you discuss any questions you have with your health care provider. Document Revised: 10/03/2020 Document Reviewed: 10/03/2020 Elsevier Patient Education  2023 [employer]  

## 2022-03-14 ENCOUNTER — Ambulatory Visit
Admission: RE | Admit: 2022-03-14 | Discharge: 2022-03-14 | Disposition: A | Discharge status: Home / Self Care | Payer: Medicare Other | Source: Ambulatory Visit | Attending: Emergency Medicine | Admitting: Emergency Medicine

## 2022-03-14 DIAGNOSIS — N6091 Unspecified benign mammary dysplasia of right breast: Secondary | ICD-10-CM | POA: Diagnosis not present

## 2022-03-14 DIAGNOSIS — R921 Mammographic calcification found on diagnostic imaging of breast: Secondary | ICD-10-CM

## 2022-03-14 DIAGNOSIS — C50911 Malignant neoplasm of unspecified site of right female breast: Secondary | ICD-10-CM | POA: Diagnosis not present

## 2022-03-16 ENCOUNTER — Telehealth: Payer: Self-pay | Admitting: Hematology

## 2022-03-16 NOTE — Telephone Encounter (Signed)
LVM for patient to return call in reference to  af

## 2022-03-16 NOTE — Telephone Encounter (Signed)
Patient called back to confirm afternoon appointment

## 2022-03-19 ENCOUNTER — Encounter: Payer: Self-pay | Admitting: *Deleted

## 2022-03-19 DIAGNOSIS — D0511 Intraductal carcinoma in situ of right breast: Secondary | ICD-10-CM | POA: Insufficient documentation

## 2022-03-20 ENCOUNTER — Other Ambulatory Visit: Payer: Self-pay | Admitting: *Deleted

## 2022-03-20 NOTE — Progress Notes (Signed)
Radiation Oncology         [phone] ________________________________  Name: Julie Pacheco        MRN: [id number]  Date of Service: 03/21/2022 DOB: Jun 23, 1945  [id number], Ines Bloomer, MD  Rolm Bookbinder, MD     REFERRING PHYSICIAN: Rolm Bookbinder, MD   DIAGNOSIS: The encounter diagnosis was Ductal carcinoma in situ (DCIS) of right breast.   HISTORY OF PRESENT ILLNESS: Julie Pacheco is a 76 y.o. female seen in the multidisciplinary breast clinic for a new diagnosis of right breast cancer. 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  She had previously been offered surgical resection but had declined.  She returned for diagnostic imaging and is identified new calcifications in the retroareolar areolar breast appearing to be intraductal spanning 1.2 cm in the medial aspect were pleomorphic and spanned up to 1.9 cm.  A small mass in the lower inner right breast was also identified packed.  By ultrasound in the 4 o'clock position was a 5 mm mass and the second finding was an intraductal mass with calcifications measuring 4 mm.  It had slightly increased in size in the interval.  She underwent biopsy x2 on 03/14/2022.  The lower inner quadrant biopsy showed low-grade DCIS with calcifications present.  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  Given these findings she is seen today to discuss treatment recommendations of her cancer.Marland Kitchen    PREVIOUS RADIATION THERAPY: {EXAM; YES/NO:19492::"No"}   PAST MEDICAL HISTORY:  Past Medical History:  Diagnosis Date   Atypical mole 02/05/2011   mild-Right post knee   Migraines    Thyroid disease        PAST SURGICAL HISTORY: Past Surgical History:  Procedure Laterality Date   ABDOMINAL HYSTERECTOMY       FAMILY  HISTORY:  Family History  Problem Relation Age of Onset   Breast cancer Maternal Aunt      SOCIAL HISTORY:  reports that she quit smoking about 40 years ago. Her smoking use included cigarettes. She has never used smokeless tobacco. She reports current alcohol use. She reports that she does not use drugs.   ALLERGIES: Patient has no known allergies.   MEDICATIONS:  Current Outpatient Medications  Medication Sig Dispense Refill   diazepam (VALIUM) 5 MG tablet Take 1 tablet (5 mg total) by mouth every 12 (twelve) hours as needed for anxiety. (Patient not taking: Reported on 02/27/2022) 20 tablet 0   levothyroxine (SYNTHROID) 75 MCG tablet TAKE 1 TABLET BY MOUTH ONCE DAILY BEFORE BREAKFAST 90 tablet 1   No current facility-administered medications for this visit.     REVIEW OF SYSTEMS: On review of systems, the patient reports that she is doing ***     PHYSICAL EXAM:  Wt Readings from Last 3 Encounters:  02/27/22 155 lb (70.3 kg)  12/19/21 155 lb 9.6 oz (70.6 kg)  08/28/21 155 lb 4 oz (70.4 kg)   Temp Readings from Last 3 Encounters:  02/27/22 97.8 F (36.6 C) (Oral)  08/28/21 (!) 97.5 F (36.4 C) (Oral)  03/08/21 98.4 F (36.9 C) (Oral)   BP Readings from Last 3 Encounters:  02/27/22 122/72  12/19/21 122/72  08/28/21 122/72   Pulse Readings from Last 3 Encounters:  02/27/22 (!) 55  12/19/21 (!) 54  08/28/21 (!)  50    In general this is a well appearing *** female in no acute distress. She's alert and oriented x4 and appropriate throughout the examination. Cardiopulmonary assessment is negative for acute distress and she exhibits normal effort. Bilateral breast exam is deferred.    ECOG = ***  0 - Asymptomatic (Fully active, able to carry on all predisease activities without restriction)  1 - Symptomatic but completely ambulatory (Restricted in physically strenuous activity but ambulatory and able to carry out work of a light or sedentary nature. For example,  light housework, office work)  2 - Symptomatic, <50% in bed during the day (Ambulatory and capable of all self care but unable to carry out any work activities. Up and about more than 50% of waking hours)  3 - Symptomatic, >50% in bed, but not bedbound (Capable of only limited self-care, confined to bed or chair 50% or more of waking hours)  4 - Bedbound (Completely disabled. Cannot carry on any self-care. Totally confined to bed or chair)  5 - Death   Eustace Pen MM, Creech RH, Tormey DC, et al. [phone]). "Toxicity and response criteria of the [facility]". [place] Oncol. 5 (6): 649-55    LABORATORY DATA:  Lab Results  Component Value Date   WBC 9.0 03/08/2021   HGB 14.1 03/08/2021   HCT 43.6 03/08/2021   MCV 98.2 03/08/2021   PLT 155 03/08/2021   Lab Results  Component Value Date   NA 142 08/28/2021   K 4.5 08/28/2021   CL 108 08/28/2021   CO2 28 08/28/2021   Lab Results  Component Value Date   ALT 28 08/28/2021   AST 29 08/28/2021   ALKPHOS 59 08/28/2021   BILITOT 0.6 08/28/2021      RADIOGRAPHY: MM RT BREAST BX W LOC DEV 1ST LESION IMAGE BX SPEC STEREO GUIDE  Addendum Date: 03/16/2022   ADDENDUM REPORT: 03/16/2022 16:19 ADDENDUM: Pathology revealed DUCTAL CARCINOMA IN SITU, LOW GRADE, CALCIFICATIONS: PRESENT of the RIGHT breast, lower inner quadrant (ribbon clip). This was found to be concordant by Dr. Franki Cabot. 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 This was found to be concordant by Dr. Franki Cabot, with excision recommended. PLEASE NOTE THAT THE CALCIFICATIONS EXTEND FROM THE X SHAPED CLIP TO THE NIPPLE. THESE ADDITIONAL CALCIFICATIONS AT THE RIGHT NIPPLE ARE SUSPICIOUS FOR ADDITIONAL EXTENT OF DISEASE. Pathology results were discussed with the patient by telephone. The patient reported doing well after the biopsies with tenderness at the sites. Post biopsy  instructions and care were reviewed and questions were answered. The patient was encouraged to call [facility] for any additional concerns. The patient was referred to [facility] on March 21, 2022. Pathology results reported by Stacie Acres RN on 03/16/2022. Electronically Signed   By: Franki Cabot M.D.   On: 03/16/2022 16:19   Result Date: 03/16/2022 CLINICAL DATA:  Patient with 2 sites of new suspicious calcifications in the RIGHT breast, medial and retroareolar respectively, for which patient presents today for a 2-site stereotactic biopsy. Patient with a probable intraductal papilloma at the level of the nipple within the RIGHT breast, biopsy not performed due to location, and surgery has been declined to this point. Most recent ultrasound report of 02/19/2022 describes this intraductal mass as stable compared to earlier exams. EXAM: RIGHT BREAST STEREOTACTIC CORE NEEDLE BIOPSY x2 COMPARISON:  Previous exam(s). FINDINGS: The patient and I discussed the procedure of stereotactic-guided biopsy including benefits and alternatives. We discussed the high likelihood of a successful procedure. We discussed the risks of the procedure including infection, bleeding, tissue injury, clip migration, and inadequate sampling. Informed written consent was given. The usual time out protocol was performed immediately prior to the procedure. Site 1: Using sterile technique and 1% Lidocaine as local anesthetic, under stereotactic guidance, a 9 gauge vacuum assisted device was used to perform core needle biopsy of calcifications in the lower inner quadrant of the left breast using a superior approach. Specimen radiograph was performed showing calcifications. Specimens with calcifications are identified for pathology. Lesion quadrant: Lower inner quadrant At the conclusion of the procedure, a ribbon shaped tissue marker clip  was deployed into the biopsy cavity. Site 2: Using sterile technique and 1% Lidocaine as local anesthetic, under stereotactic guidance, a 9 gauge vacuum assisted device was used to perform core needle biopsy of calcifications in the lower subareolar LEFT breast using a lateral approach. Specimen radiograph was performed showing calcifications. Specimens with calcifications are identified for pathology. Lesion quadrant: Subareolar At the conclusion of the procedure, an X shaped tissue marker clip was deployed into the biopsy cavity. Follow-up 2-view mammogram was performed and dictated separately. IMPRESSION: 1. Stereotactic-guided biopsy of indeterminate calcifications within the lower inner quadrant of the RIGHT breast. No apparent complications. 2. Stereotactic-guided biopsy of indeterminate calcifications within the lower subareolar RIGHT breast. No apparent complications. Electronically Signed: By: Franki Cabot M.D. On: 03/14/2022 11:41  MM RT BREAST BX W LOC DEV EA AD LESION IMG BX SPEC STEREO GUIDE  Addendum Date: 03/16/2022   ADDENDUM REPORT: 03/16/2022 16:19 ADDENDUM: Pathology revealed DUCTAL CARCINOMA IN SITU, LOW GRADE, CALCIFICATIONS: PRESENT of the RIGHT breast, lower inner quadrant (ribbon clip). This was found to be concordant by Dr. Franki Cabot. 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 This was found to be concordant by Dr. Franki Cabot, with excision recommended. PLEASE NOTE THAT THE CALCIFICATIONS EXTEND FROM THE X SHAPED CLIP TO THE NIPPLE. THESE ADDITIONAL CALCIFICATIONS AT THE RIGHT NIPPLE ARE SUSPICIOUS FOR ADDITIONAL EXTENT OF DISEASE. Pathology results were discussed with the patient by telephone. The patient reported doing well after the biopsies with tenderness at the sites. Post biopsy instructions and care were reviewed and questions were answered. The patient was encouraged to call [facility] for any additional concerns. The patient was referred to [facility] on March 21, 2022. Pathology results reported by Stacie Acres RN on 03/16/2022. Electronically Signed   By: Franki Cabot M.D.   On: 03/16/2022 16:19   Result Date: 03/16/2022 CLINICAL DATA:  Patient with 2 sites of new suspicious calcifications in the RIGHT breast, medial and retroareolar respectively, for which patient presents today for a 2-site stereotactic biopsy. Patient with a probable intraductal papilloma at the level of the nipple within the RIGHT breast, biopsy not performed due to location, and surgery has been declined to this point. Most recent ultrasound report of 02/19/2022 describes this intraductal mass as stable compared to earlier exams. EXAM: RIGHT BREAST STEREOTACTIC CORE NEEDLE BIOPSY x2 COMPARISON:  Previous exam(s). FINDINGS: The patient and I discussed the procedure of stereotactic-guided biopsy including benefits and alternatives. We discussed the high likelihood of a successful procedure. We discussed the risks of the procedure including infection, bleeding, tissue injury, clip  migration, and inadequate sampling. Informed written consent was given. The usual time out protocol was performed immediately prior to the procedure. Site 1: Using sterile technique and 1% Lidocaine as local anesthetic, under stereotactic guidance, a 9 gauge vacuum assisted device was used to perform core needle biopsy of calcifications in the lower inner quadrant of the left breast using a superior approach. Specimen radiograph was performed showing calcifications. Specimens with calcifications are identified for pathology. Lesion quadrant: Lower inner quadrant At the conclusion of the procedure, a ribbon shaped tissue marker clip was deployed into the biopsy cavity. Site 2: Using sterile technique and 1% Lidocaine as local anesthetic, under  stereotactic guidance, a 9 gauge vacuum assisted device was used to perform core needle biopsy of calcifications in the lower subareolar LEFT breast using a lateral approach. Specimen radiograph was performed showing calcifications. Specimens with calcifications are identified for pathology. Lesion quadrant: Subareolar At the conclusion of the procedure, an X shaped tissue marker clip was deployed into the biopsy cavity. Follow-up 2-view mammogram was performed and dictated separately. IMPRESSION: 1. Stereotactic-guided biopsy of indeterminate calcifications within the lower inner quadrant of the RIGHT breast. No apparent complications. 2. Stereotactic-guided biopsy of indeterminate calcifications within the lower subareolar RIGHT breast. No apparent complications. Electronically Signed: By: Franki Cabot M.D. On: 03/14/2022 11:41  MM CLIP PLACEMENT RIGHT  Result Date: 03/14/2022 CLINICAL DATA:  Status post 2 site stereotactic biopsy for indeterminate calcifications within the lower inner quadrant of the RIGHT breast and subareolar RIGHT breast respectively. EXAM: 3D DIAGNOSTIC RIGHT MAMMOGRAM POST STEREOTACTIC BIOPSY x2 COMPARISON:  Previous exam(s). FINDINGS: 3D Mammographic images were obtained following stereotactic guided biopsy of indeterminate calcifications within the lower inner quadrant of the RIGHT breast followed by stereotactic guided biopsy of indeterminate calcifications within the lower subareolar RIGHT breast. The biopsy marking clips are in expected position at the sites of biopsy. IMPRESSION: 1. Appropriate positioning of the ribbon shaped biopsy marking clip at the site of biopsy in the lower inner quadrant of the RIGHT breast. 2. Appropriate positioning of the X shaped biopsy marking clip at the site of biopsy in the lower subareolar RIGHT breast. Final Assessment: Post Procedure Mammograms for Marker Placement Electronically Signed   By: Franki Cabot M.D.   On: 03/14/2022 11:43  MM DIAG  BREAST TOMO BILATERAL  Result Date: 02/19/2022 CLINICAL DATA:  76 year old female presenting for 1 year follow-up of a nipple papilloma in the right breast. Given location ultrasound biopsy was not performed. Patient had surgical consultation and declined surgery. EXAM: DIGITAL DIAGNOSTIC BILATERAL MAMMOGRAM WITH TOMOSYNTHESIS; ULTRASOUND RIGHT BREAST LIMITED TECHNIQUE: Bilateral digital diagnostic mammography and breast tomosynthesis was performed.; Targeted ultrasound examination of the right breast was performed COMPARISON:  Previous exam(s). ACR Breast Density Category c: The breast tissue is heterogeneously dense, which may obscure small masses. FINDINGS: Mammogram: Right breast: Spot 2D and spot compression tomosynthesis views of the right breast were performed in addition to standard views. There are new calcifications in the retroareolar and medial right breast. The calcifications in the retroareolar aspect appear linear/intraductal and span 1.2 cm. The calcifications in the medial aspect are pleomorphic and span approximately 1.9 cm. There is a small oval circumscribed mass in the lower inner right breast near the calcifications. Left breast: No suspicious mass, distortion, or microcalcifications are identified to suggest presence of malignancy. Ultrasound: Targeted ultrasound is performed in the right breast at 4 o'clock 1 cm from the nipple demonstrating an oval circumscribed anechoic mass with posterior enhancement measuring 0.4  x 0.4 x 0.5 cm, most consistent with a cyst. No internal vascularity. Targeted ultrasound the nipple demonstrates a small intraductal mass with calcification measuring 0.2 x 0.4 x 0.4 cm, previously measuring 0.2 x 0.2 x 0.4 cm. IMPRESSION: 1. New suspicious calcifications in the retroareolar and medial aspects of the right breast. 2.  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 3.  No mammographic evidence of malignancy in the left breast. RECOMMENDATION:  Stereotactic core needle biopsy x2 of the right breast. I have discussed the findings and recommendations with the patient. The patient is unsure if she wants to proceed with biopsy at this time. Our scheduler will contact her tomorrow to arrange the biopsy appointment if she agrees. BI-RADS CATEGORY  4: Suspicious. Electronically Signed   By: Audie Pinto M.D.   On: 02/19/2022 16:37  US BREAST LTD UNI RIGHT INC AXILLA  Result Date: 02/19/2022 CLINICAL DATA:  76 year old female presenting for 1 year follow-up of a nipple papilloma in the right breast. Given location ultrasound biopsy was not performed. Patient had surgical consultation and declined surgery. EXAM: DIGITAL DIAGNOSTIC BILATERAL MAMMOGRAM WITH TOMOSYNTHESIS; ULTRASOUND RIGHT BREAST LIMITED TECHNIQUE: Bilateral digital diagnostic mammography and breast tomosynthesis was performed.; Targeted ultrasound examination of the right breast was performed COMPARISON:  Previous exam(s). ACR Breast Density Category c: The breast tissue is heterogeneously dense, which may obscure small masses. FINDINGS: Mammogram: Right breast: Spot 2D and spot compression tomosynthesis views of the right breast were performed in addition to standard views. There are new calcifications in the retroareolar and medial right breast. The calcifications in the retroareolar aspect appear linear/intraductal and span 1.2 cm. The calcifications in the medial aspect are pleomorphic and span approximately 1.9 cm. There is a small oval circumscribed mass in the lower inner right breast near the calcifications. Left breast: No suspicious mass, distortion, or microcalcifications are identified to suggest presence of malignancy. Ultrasound: Targeted ultrasound is performed in the right breast at 4 o'clock 1 cm from the nipple demonstrating an oval circumscribed anechoic mass with posterior enhancement measuring 0.4 x 0.4 x 0.5 cm, most consistent with a cyst. No internal vascularity.  Targeted ultrasound the nipple demonstrates a small intraductal mass with calcification measuring 0.2 x 0.4 x 0.4 cm, previously measuring 0.2 x 0.2 x 0.4 cm. IMPRESSION: 1. New suspicious calcifications in the retroareolar and medial aspects of the right breast. 2.  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 3.  No mammographic evidence of malignancy in the left breast. RECOMMENDATION: Stereotactic core needle biopsy x2 of the right breast. I have discussed the findings and recommendations with the patient. The patient is unsure if she wants to proceed with biopsy at this time. Our scheduler will contact her tomorrow to arrange the biopsy appointment if she agrees. BI-RADS CATEGORY  4: Suspicious. Electronically Signed   By: Audie Pinto M.D.   On: 02/19/2022 16:37      IMPRESSION/PLAN: 1. Low-grade ER/PR positive DCIS of the right breast. Dr. Lisbeth Renshaw discusses the pathology findings and reviews the nature of *** breast disease. The consensus from the breast conference includes breast conservation with lumpectomy.  Dr. Lisbeth Renshaw recommends adjuvant external radiotherapy to the breast  to reduce risks of local recurrence followed by antiestrogen therapy. We discussed the risks, benefits, short, and long term effects of radiotherapy, as well as the curative intent, and the patient is interested in proceeding. Dr. Lisbeth Renshaw discusses the delivery and logistics of radiotherapy and anticipates a course  of 4 weeks of radiotherapy to the right breast. We will see her back a few weeks after surgery to discuss the simulation process and anticipate we starting radiotherapy about 4-6 weeks after surgery.  2. Possible genetic predisposition to malignancy. The patient is a candidate for genetic testing given *** personal and family history. She will meet with our geneticist today in clinic.   In a visit lasting *** minutes, greater than 50% of the time was spent face to face reviewing her case, as well as  in preparation of, discussing, and coordinating the patient's care.  The above documentation reflects my direct findings during this shared patient visit. Please see the separate note by Dr. Lisbeth Renshaw on this date for the remainder of the patient's plan of care.    Carola Rhine, [facility]    **Disclaimer: This note was dictated with voice recognition software. Similar sounding words can inadvertently be transcribed and this note may contain transcription errors which may not have been corrected upon publication of note.**

## 2022-03-21 ENCOUNTER — Other Ambulatory Visit: Payer: Self-pay

## 2022-03-21 ENCOUNTER — Inpatient Hospital Stay: Payer: Medicare Other | Attending: Hematology

## 2022-03-21 ENCOUNTER — Inpatient Hospital Stay (HOSPITAL_BASED_OUTPATIENT_CLINIC_OR_DEPARTMENT_OTHER): Payer: Medicare Other | Admitting: Hematology

## 2022-03-21 ENCOUNTER — Ambulatory Visit: Payer: Medicare Other | Admitting: Physical Therapy

## 2022-03-21 ENCOUNTER — Inpatient Hospital Stay (HOSPITAL_BASED_OUTPATIENT_CLINIC_OR_DEPARTMENT_OTHER): Payer: Medicare Other | Admitting: Genetic Counselor

## 2022-03-21 ENCOUNTER — Ambulatory Visit
Admission: RE | Admit: 2022-03-21 | Discharge: 2022-03-21 | Disposition: A | Discharge status: Home / Self Care | Payer: Medicare Other | Source: Ambulatory Visit | Attending: Radiation Oncology | Admitting: Radiation Oncology

## 2022-03-21 ENCOUNTER — Other Ambulatory Visit: Payer: Self-pay | Admitting: General Surgery

## 2022-03-21 ENCOUNTER — Encounter: Payer: Self-pay | Admitting: *Deleted

## 2022-03-21 ENCOUNTER — Encounter: Payer: Self-pay | Admitting: Hematology

## 2022-03-21 VITALS — BP 177/76 | HR 104 | Temp 97.8°F | Resp 18 | Ht 62.0 in | Wt 155.5 lb

## 2022-03-21 DIAGNOSIS — Z90710 Acquired absence of both cervix and uterus: Secondary | ICD-10-CM

## 2022-03-21 DIAGNOSIS — Z801 Family history of malignant neoplasm of trachea, bronchus and lung: Secondary | ICD-10-CM

## 2022-03-21 DIAGNOSIS — Z170 Estrogen receptor positive status [ER+]: Secondary | ICD-10-CM | POA: Diagnosis not present

## 2022-03-21 DIAGNOSIS — Z87891 Personal history of nicotine dependence: Secondary | ICD-10-CM | POA: Insufficient documentation

## 2022-03-21 DIAGNOSIS — Z800 Family history of malignant neoplasm of digestive organs: Secondary | ICD-10-CM | POA: Diagnosis not present

## 2022-03-21 DIAGNOSIS — Z86018 Personal history of other benign neoplasm: Secondary | ICD-10-CM

## 2022-03-21 DIAGNOSIS — Z803 Family history of malignant neoplasm of breast: Secondary | ICD-10-CM | POA: Insufficient documentation

## 2022-03-21 DIAGNOSIS — E039 Hypothyroidism, unspecified: Secondary | ICD-10-CM

## 2022-03-21 DIAGNOSIS — D0511 Intraductal carcinoma in situ of right breast: Secondary | ICD-10-CM

## 2022-03-21 LAB — CMP (CANCER CENTER ONLY)
ALT: 20 U/L (ref 0–44)
AST: 21 U/L (ref 15–41)
Albumin: 4 g/dL (ref 3.5–5.0)
Alkaline Phosphatase: 66 U/L (ref 38–126)
Anion gap: 5 (ref 5–15)
BUN: 8 mg/dL (ref 8–23)
CO2: 29 mmol/L (ref 22–32)
Calcium: 9 mg/dL (ref 8.9–10.3)
Chloride: 107 mmol/L (ref 98–111)
Creatinine: 0.81 mg/dL (ref 0.44–1.00)
GFR, Estimated: >60 mL/min (ref >60)
Glucose, Bld: 90 mg/dL (ref 70–99)
Potassium: 4.8 mmol/L (ref 3.5–5.1)
Sodium: 141 mmol/L (ref 135–145)
Total Bilirubin: 0.5 mg/dL (ref 0.3–1.2)
Total Protein: 7.3 g/dL (ref 6.5–8.1)

## 2022-03-21 LAB — CBC WITH DIFFERENTIAL (CANCER CENTER ONLY)
Abs Immature Granulocytes: 0.01 10*3/uL (ref 0.00–0.07)
Basophils Absolute: 0 10*3/uL (ref 0.0–0.1)
Basophils Relative: 1 %
Eosinophils Absolute: 0.1 10*3/uL (ref 0.0–0.5)
Eosinophils Relative: 1 %
HCT: 41.6 % (ref 36.0–46.0)
Hemoglobin: 13.9 g/dL (ref 12.0–15.0)
Immature Granulocytes: 0 %
Lymphocytes Relative: 30 %
Lymphs Abs: 1.9 10*3/uL (ref 0.7–4.0)
MCH: 31.7 pg (ref 26.0–34.0)
MCHC: 33.4 g/dL (ref 30.0–36.0)
MCV: 94.8 fL (ref 80.0–100.0)
Monocytes Absolute: 0.5 10*3/uL (ref 0.1–1.0)
Monocytes Relative: 8 %
Neutro Abs: 3.7 10*3/uL (ref 1.7–7.7)
Neutrophils Relative %: 60 %
Platelet Count: 223 10*3/uL (ref 150–400)
RBC: 4.39 MIL/uL (ref 3.87–5.11)
RDW: 13 % (ref 11.5–15.5)
WBC Count: 6.3 10*3/uL (ref 4.0–10.5)
nRBC: 0 % (ref 0.0–0.2)

## 2022-03-21 LAB — GENETIC SCREENING ORDER

## 2022-03-21 NOTE — Progress Notes (Signed)
[facility] Psychosocial Distress Screening Spiritual Care  Met with [place] and granddaughter who she raised, in [facility] to introduce [place] team/resources, reviewing distress screen per protocol.  The patient scored a 1 on the Psychosocial Distress Thermometer which indicates mild distress. Also assessed for distress and other psychosocial needs.      03/21/2022    4:18 PM  ONCBCN DISTRESS SCREENING  Distress experienced in past week (1-10) 0  Referral to support programs Yes   The patient reported they are not nervous about their diagnosis. The patient shared stories about when they lived in [place]. The patient reports having a strong support system.   The patient has the counselor's number in case needs arise.   Follow up needed: No.  Lysle Morales  Counseling Intern  [phone]

## 2022-03-21 NOTE — Addendum Note (Signed)
Encounter addended by: Hayden Pedro, PA-C on: 03/21/2022 4:29 PM  Actions taken: Level of Service modified

## 2022-03-21 NOTE — Progress Notes (Signed)
Thanks

## 2022-03-21 NOTE — Research (Signed)
MTG-015 - Tissue and Bodily Fluids: Translational Medicine: Discovery and Evaluation of Biomarkers/Pharmacogenomics for the Diagnosis and Personalized Management of Patients    Patient Julie Pacheco was identified by Dr. Burr Medico as a potential candidate for the above listed study.  This Clinical Research Nurse met with Catrena Vari, [id number], on 03/21/22 in a manner and location that ensures patient privacy to discuss participation in the above listed research study.  Patient is Accompanied by her grand-daughter .  A copy of the informed consent document and separate HIPAA Authorization was provided to the patient.  Patient reads, speaks, and understands [place].   Patient was provided with the business card of this Nurse and encouraged to contact the research team with any questions.  Approximately 15 minutes was spent with the patient reviewing the informed consent documents.  Patient was provided the option of taking informed consent documents home to review and was encouraged to review at their convenience with their support network, including other care providers. Patient took the consent documents home to review. The pt asked that someone from the research department contact her next Tuesday afternoon (03/27/22) or next Wednesday, 03/28/22, to discuss her study participation.  The pt was told that her study participation is optional.  The pt was also told that if she decides to participate then she would need to sign the consent form and have her blood drawn before her surgery.  The pt verbalized understanding.  The pt was thanked for her consideration of this specimen study. Brion Aliment RN, BSN, CCRP Clinical Research Nurse Lead 03/21/2022 3:46 PM

## 2022-03-21 NOTE — Progress Notes (Signed)
[place]   [phone] Fax:([phone] [place] Note   Patient Care Team: Horald Pollen, MD as PCP - General (Internal Medicine) Lavonna Monarch, MD (Inactive) as Consulting Physician (Dermatology) Okey Regal, [place] as Consulting Physician (Optometry) Mauro Kaufmann, RN as Oncology Nurse Navigator Rockwell Germany, RN as Oncology Nurse Navigator Rolm Bookbinder, MD as Consulting Physician (General Surgery) Truitt Merle, MD as Consulting Physician (Hematology) Kyung Rudd, MD as Consulting Physician (Radiation Oncology)  Date of Service:  03/21/2022   CHIEF COMPLAINTS/PURPOSE OF CONSULTATION:  Right Breast DCIS, ER+  REFERRING PHYSICIAN:  [facility]   ASSESSMENT & PLAN:  Julie Pacheco is a 76 y.o. post-hysterectomy female with a history of hypothyroidism  1. Right breast DCIS, grade 1, ER+/PR+ -monitored for papilloma initially seen on mammogram 03/2020. Met Dr. Donne Hazel at that time, declined surgery. -surveillance b/l MM and right [place] on 02/19/22 showed: new 0.5 cm calcifications; stable 0.4 cm intraductal mass in nipple. Biopsy on 03/14/22 showed DCIS to calcifications and ADH to nipple mass. --I discussed her breast imaging and needle biopsy results with patient and her family members in great detail. -She is a candidate for breast conservation surgery. She has been seen by breast surgeon Dr. Donne Hazel today, who recommends lumpectomy. Plan for removal of the ADH as well. -Her DCIS will be cured by complete surgical resection. Any form of adjuvant therapy is preventive.  We discussed that DCIS is at high risk for future breast cancer. -Given her strongly positive ER and PR, I recommend antiestrogen therapy, which decreases her risk of future breast cancer by ~40%.  -She may benefit from breast radiation to decrease the risk of breast cancer. She will discuss this further with Dr. Lisbeth Renshaw today.  -Given her advanced age,  I think is reasonable to do only adjuvant RT or antiestrogen therapy, but not both.  -We also discussed that biopsy may have sampling limitation, we will review her surgical path, to see if she has any invasive carcinoma components. -We discussed breast cancer surveillance after she completes treatment, Including annual mammogram, breast exam every 6-12 months.   PLAN:  -proceed with lumpectomy -will see her after surgery or radiation    Oncology History Overview Note   Cancer Staging  Ductal carcinoma in situ (DCIS) of right breast Staging form: Breast, AJCC 8th Edition - Clinical stage from 03/14/2022: Stage 0 (cTis (DCIS), cN0, cM0, G1, ER+, PR+, HER2: Not Assessed) - Signed by Truitt Merle, MD on 03/20/2022    Ductal carcinoma in situ (DCIS) of right breast  02/19/2022 Mammogram   CLINICAL DATA:  76 year old female presenting for 1 year follow-up of a nipple papilloma in the right breast. Given location ultrasound biopsy was not performed. Patient had surgical consultation and declined surgery.   EXAM: DIGITAL DIAGNOSTIC BILATERAL MAMMOGRAM WITH TOMOSYNTHESIS; ULTRASOUND RIGHT BREAST LIMITED  IMPRESSION: 1. New suspicious calcifications in the retroareolar and medial aspects of the right breast.   2.  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   3.  No mammographic evidence of malignancy in the left breast.   03/14/2022 Cancer Staging   Staging form: Breast, AJCC 8th Edition - Clinical stage from 03/14/2022: Stage 0 (cTis (DCIS), cN0, cM0, G1, ER+, PR+, HER2: Not Assessed) - Signed by Truitt Merle, MD on 03/20/2022 Stage prefix: Initial diagnosis Histologic grading system: 3 grade system   03/14/2022 Initial Biopsy   Diagnosis 1. Breast, right, needle core biopsy, lower inner  quadrant, ribbon clip - DUCTAL CARCINOMA IN SITU, LOW GRADE - NECROSIS: NOT IDENTIFIED - CALCIFICATIONS: PRESENT - DCIS LENGTH: AT LEAST 0.3 CM 2. 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 PROGNOSTIC INDICATORS Results: Estrogen Receptor: 100%, POSITIVE, STRONG STAINING INTENSITY Progesterone Receptor: 2%, POSITIVE, STRONG STAINING INTENSITY   03/19/2022 Initial Diagnosis   Ductal carcinoma in situ (DCIS) of right breast      HISTORY OF PRESENTING ILLNESS:  Julie Pacheco 76 y.o. female is a here because of breast cancer. The [facility] was [facility] by [facility]. The patient presents to the clinic today accompanied by her granddaughter Julie Pacheco.   She was initially found to have a papilloma in the right nipple back on screening mammogram in 03/2020. She was seen by Dr. Donne Hazel at that time. They discussed surgery, which she declined at that time. She underwent bilateral diagnostic mammography and right breast ultrasonography on 02/19/22 showing: new 0.5 cm calcifications in right breast at 4 o'clock, 1 cm from nipple; stable 0.4 cm small intraductal mass within right nipple.  Biopsy on 03/14/22 showed: DCIS to the new calcifications; ADH to the nipple mass. Prognostic indicators significant for: estrogen receptor, 100% positive and progesterone receptor, 2% positive.   She has a PMHx of.... -s/p total hysterectomy many years ago for endometriosis -hypothyroidism  Socially... -she is married and lives at home with her husband, who is 82 years old -she adopted her granddaughter Julie Pacheco at 3 months old Julie Pacheco is now married and lives on her own). -she reports breast cancer in a maternal aunt at age 76. Other cancers include colon in a cousin, pancreatic in another maternal aunt, and lung in her mother. -former smoker, 1 ppd for 30 years, quit 1983. -she drinks 8 glasses of wine/alcohol a week.   GYN HISTORY  Menarchal: xx LMP: xx Contraceptive: HRT: none GP:   REVIEW OF SYSTEMS:    Constitutional: Denies fevers, chills  or abnormal night sweats Eyes: Denies blurriness of vision, double vision or watery eyes Ears, nose, mouth, throat, and face: Denies mucositis or sore throat Respiratory: Denies cough, dyspnea or wheezes Cardiovascular: Denies palpitation, chest discomfort or lower extremity swelling Gastrointestinal:  Denies nausea, heartburn or change in bowel habits Skin: Denies abnormal skin rashes Lymphatics: Denies new lymphadenopathy or easy bruising Neurological:Denies numbness, tingling or new weaknesses Behavioral/Psych: Mood is stable, no new changes  All other systems were reviewed with the patient and are negative.   MEDICAL HISTORY:  Past Medical History:  Diagnosis Date   Atypical mole 02/05/2011   mild-Right post knee   Breast cancer ([place])    Migraines    Thyroid disease     SURGICAL HISTORY: Past Surgical History:  Procedure Laterality Date   ABDOMINAL HYSTERECTOMY      SOCIAL HISTORY: Social History   Socioeconomic History   Marital status: Married    Spouse name: Not on file   Number of children: 2   Years of education: Not on file   Highest education level: Not on file  Occupational History   Not on file  Tobacco Use   Smoking status: Former    Packs/day: 1.00    Years: 30.00    Total pack years: 30.00    Types: Cigarettes    Quit date: 12/15/1981    Years since quitting: 40.2   Smokeless tobacco: Never  Substance and Sexual Activity  Alcohol use: Yes    Alcohol/week: 8.0 standard drinks of alcohol    Types: 8 Glasses of wine per week   Drug use: Never   Sexual activity: Not on file  Other Topics Concern   Not on file  Social History Narrative   Not on file   Social Determinants of Health   Financial Resource Strain: Low Risk  (08/25/2021)   Overall Financial Resource Strain (CARDIA)    Difficulty of Paying Living Expenses: Not hard at all  Food Insecurity: No Food Insecurity (08/25/2021)   Hunger Vital Sign    Worried About Running Out of Food in  the Last Year: Never true    Ran Out of Food in the Last Year: Never true  Transportation Needs: No Transportation Needs (08/25/2021)   PRAPARE - Hydrologist (Medical): No    Lack of Transportation (Non-Medical): No  Physical Activity: Sufficiently Active (08/25/2021)   Exercise Vital Sign    Days of Exercise per Week: 4 days    Minutes of Exercise per Session: 60 min  Stress: No Stress Concern Present (08/25/2021)   [place]    Feeling of Stress : Not at all  Social Connections: Moderately Integrated (08/25/2021)   Social Connection and Isolation Panel [NHANES]    Frequency of Communication with Friends and Family: Twice a week    Frequency of Social Gatherings with Friends and Family: Twice a week    Attends Religious Services: Never    Marine scientist or Organizations: Yes    Attends Music therapist: More than 4 times per year    Marital Status: Married  Human resources officer Violence: Not At Risk (08/25/2021)   Humiliation, Afraid, Rape, and Kick questionnaire    Fear of Current or Ex-Partner: No    Emotionally Abused: No    Physically Abused: No    Sexually Abused: No    FAMILY HISTORY: Family History  Problem Relation Age of Onset   Cancer Mother        lung cancer   Breast cancer Maternal Aunt 55   Cancer Maternal Aunt 80       pancreatic cancer   Cancer Cousin        colon cancer    ALLERGIES:  has No Known Allergies.  MEDICATIONS:  Current Outpatient Medications  Medication Sig Dispense Refill   diazepam (VALIUM) 5 MG tablet Take 1 tablet (5 mg total) by mouth every 12 (twelve) hours as needed for anxiety. (Patient not taking: Reported on 02/27/2022) 20 tablet 0   levothyroxine (SYNTHROID) 75 MCG tablet TAKE 1 TABLET BY MOUTH ONCE DAILY BEFORE BREAKFAST 90 tablet 1   No current facility-administered medications for this visit.    PHYSICAL  EXAMINATION: ECOG PERFORMANCE STATUS: 0 - Asymptomatic  Vitals:   03/21/22 1309  BP: (!) 177/76  Pulse: (!) 104  Resp: 18  Temp: 97.8 F (36.6 C)  SpO2: 99%   Filed Weights   03/21/22 1309  Weight: 155 lb 8 oz (70.5 kg)    GENERAL:alert, no distress and comfortable SKIN: skin color, texture, turgor are normal, no rashes or significant lesions EYES: normal, Conjunctiva are pink and non-injected, sclera clear  NECK: supple, thyroid normal size, non-tender, without nodularity LYMPH:  no palpable lymphadenopathy in the cervical, axillary LUNGS: clear to auscultation and percussion with normal breathing effort HEART: regular rate & rhythm and no murmurs and no lower extremity edema ABDOMEN:abdomen  soft, non-tender and normal bowel sounds Musculoskeletal:no cyanosis of digits and no clubbing  NEURO: alert & oriented x 3 with fluent speech, no focal motor/sensory deficits BREAST: 4 x 5 cm ecchymosis under right nipple.  No palpable mass, nodules or adenopathy bilaterally.   LABORATORY DATA:  I have reviewed the data as listed    Latest Ref Rng & Units 03/21/2022   12:58 PM 03/08/2021    8:11 PM 09/12/2016    8:52 AM  CBC  WBC 4.0 - 10.5 K/uL 6.3  9.0  6.2   Hemoglobin 12.0 - 15.0 g/dL 13.9  14.1  14.4   Hematocrit 36.0 - 46.0 % 41.6  43.6  42.2   Platelets 150 - 400 K/uL 223  155  216        Latest Ref Rng & Units 03/21/2022   12:58 PM 08/28/2021    1:32 PM 03/08/2021    8:11 PM  CMP  Glucose 70 - 99 mg/dL 90  90  88   BUN 8 - 23 mg/dL _0 Creatinine 0.44 - 1.00 mg/dL 0.81  0.75  0.64   Sodium 135 - 145 mmol/L 141  142  139   Potassium 3.5 - 5.1 mmol/L 4.8  4.5  3.9   Chloride 98 - 111 mmol/L 107  108  107   CO2 22 - 32 mmol/L _1 Calcium 8.9 - 10.3 mg/dL 9.0  9.0  8.7   Total Protein 6.5 - 8.1 g/dL 7.3  6.9    Total Bilirubin 0.3 - 1.2 mg/dL 0.5  0.6    Alkaline Phos 38 - 126 U/L 66  59    AST 15 - 41 U/L 21  29    ALT 0 - 44 U/L 20  28        RADIOGRAPHIC STUDIES: I have personally reviewed the radiological images as listed and agreed with the findings in the report. MM RT BREAST BX W LOC DEV 1ST LESION IMAGE BX SPEC STEREO GUIDE  Addendum Date: 03/16/2022   ADDENDUM REPORT: 03/16/2022 16:19 ADDENDUM: Pathology revealed DUCTAL CARCINOMA IN SITU, LOW GRADE, CALCIFICATIONS: PRESENT of the RIGHT breast, lower inner quadrant (ribbon clip). This was found to be concordant by Dr. Franki Cabot. 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 This was found to be concordant by Dr. Franki Cabot, with excision recommended. PLEASE NOTE THAT THE CALCIFICATIONS EXTEND FROM THE X SHAPED CLIP TO THE NIPPLE. THESE ADDITIONAL CALCIFICATIONS AT THE RIGHT NIPPLE ARE SUSPICIOUS FOR ADDITIONAL EXTENT OF DISEASE. Pathology results were discussed with the patient by telephone. The patient reported doing well after the biopsies with tenderness at the sites. Post biopsy instructions and care were reviewed and questions were answered. The patient was encouraged to call [facility] for any additional concerns. The patient was referred to [facility] on March 21, 2022. Pathology results reported by Stacie Acres RN on 03/16/2022. Electronically Signed   By: Franki Cabot M.D.   On: 03/16/2022 16:19   Result Date: 03/16/2022 CLINICAL DATA:  Patient with 2 sites of new suspicious calcifications in the RIGHT breast, medial and retroareolar respectively, for which patient presents today for a 2-site stereotactic biopsy. Patient with a probable intraductal papilloma at the level of the nipple within the RIGHT breast, biopsy not performed due to location, and  surgery has been declined to this point. Most recent ultrasound report of 02/19/2022 describes this intraductal mass as stable compared to  earlier exams. EXAM: RIGHT BREAST STEREOTACTIC CORE NEEDLE BIOPSY x2 COMPARISON:  Previous exam(s). FINDINGS: The patient and I discussed the procedure of stereotactic-guided biopsy including benefits and alternatives. We discussed the high likelihood of a successful procedure. We discussed the risks of the procedure including infection, bleeding, tissue injury, clip migration, and inadequate sampling. Informed written consent was given. The usual time out protocol was performed immediately prior to the procedure. Site 1: Using sterile technique and 1% Lidocaine as local anesthetic, under stereotactic guidance, a 9 gauge vacuum assisted device was used to perform core needle biopsy of calcifications in the lower inner quadrant of the left breast using a superior approach. Specimen radiograph was performed showing calcifications. Specimens with calcifications are identified for pathology. Lesion quadrant: Lower inner quadrant At the conclusion of the procedure, a ribbon shaped tissue marker clip was deployed into the biopsy cavity. Site 2: Using sterile technique and 1% Lidocaine as local anesthetic, under stereotactic guidance, a 9 gauge vacuum assisted device was used to perform core needle biopsy of calcifications in the lower subareolar LEFT breast using a lateral approach. Specimen radiograph was performed showing calcifications. Specimens with calcifications are identified for pathology. Lesion quadrant: Subareolar At the conclusion of the procedure, an X shaped tissue marker clip was deployed into the biopsy cavity. Follow-up 2-view mammogram was performed and dictated separately. IMPRESSION: 1. Stereotactic-guided biopsy of indeterminate calcifications within the lower inner quadrant of the RIGHT breast. No apparent complications. 2. Stereotactic-guided biopsy of indeterminate calcifications within the lower subareolar RIGHT breast. No apparent complications. Electronically Signed: By: Franki Cabot M.D. On:  03/14/2022 11:41  MM RT BREAST BX W LOC DEV EA AD LESION IMG BX SPEC STEREO GUIDE  Addendum Date: 03/16/2022   ADDENDUM REPORT: 03/16/2022 16:19 ADDENDUM: Pathology revealed DUCTAL CARCINOMA IN SITU, LOW GRADE, CALCIFICATIONS: PRESENT of the RIGHT breast, lower inner quadrant (ribbon clip). This was found to be concordant by Dr. Franki Cabot. 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 This was found to be concordant by Dr. Franki Cabot, with excision recommended. PLEASE NOTE THAT THE CALCIFICATIONS EXTEND FROM THE X SHAPED CLIP TO THE NIPPLE. THESE ADDITIONAL CALCIFICATIONS AT THE RIGHT NIPPLE ARE SUSPICIOUS FOR ADDITIONAL EXTENT OF DISEASE. Pathology results were discussed with the patient by telephone. The patient reported doing well after the biopsies with tenderness at the sites. Post biopsy instructions and care were reviewed and questions were answered. The patient was encouraged to call [facility] for any additional concerns. The patient was referred to [facility] on March 21, 2022. Pathology results reported by Stacie Acres RN on 03/16/2022. Electronically Signed   By: Franki Cabot M.D.   On: 03/16/2022 16:19   Result Date: 03/16/2022 CLINICAL DATA:  Patient with 2 sites of new suspicious calcifications in the RIGHT breast, medial and retroareolar respectively, for which patient presents today for a 2-site stereotactic biopsy. Patient with a probable intraductal papilloma at the level of the nipple within the RIGHT breast, biopsy not performed due to location, and surgery has been declined to this point. Most recent ultrasound report of 02/19/2022 describes this intraductal mass as stable compared to earlier exams. EXAM: RIGHT BREAST STEREOTACTIC CORE NEEDLE BIOPSY x2 COMPARISON:  Previous exam(s). FINDINGS: The  patient  and I discussed the procedure of stereotactic-guided biopsy including benefits and alternatives. We discussed the high likelihood of a successful procedure. We discussed the risks of the procedure including infection, bleeding, tissue injury, clip migration, and inadequate sampling. Informed written consent was given. The usual time out protocol was performed immediately prior to the procedure. Site 1: Using sterile technique and 1% Lidocaine as local anesthetic, under stereotactic guidance, a 9 gauge vacuum assisted device was used to perform core needle biopsy of calcifications in the lower inner quadrant of the left breast using a superior approach. Specimen radiograph was performed showing calcifications. Specimens with calcifications are identified for pathology. Lesion quadrant: Lower inner quadrant At the conclusion of the procedure, a ribbon shaped tissue marker clip was deployed into the biopsy cavity. Site 2: Using sterile technique and 1% Lidocaine as local anesthetic, under stereotactic guidance, a 9 gauge vacuum assisted device was used to perform core needle biopsy of calcifications in the lower subareolar LEFT breast using a lateral approach. Specimen radiograph was performed showing calcifications. Specimens with calcifications are identified for pathology. Lesion quadrant: Subareolar At the conclusion of the procedure, an X shaped tissue marker clip was deployed into the biopsy cavity. Follow-up 2-view mammogram was performed and dictated separately. IMPRESSION: 1. Stereotactic-guided biopsy of indeterminate calcifications within the lower inner quadrant of the RIGHT breast. No apparent complications. 2. Stereotactic-guided biopsy of indeterminate calcifications within the lower subareolar RIGHT breast. No apparent complications. Electronically Signed: By: Franki Cabot M.D. On: 03/14/2022 11:41  MM CLIP PLACEMENT RIGHT  Result Date: 03/14/2022 CLINICAL DATA:  Status post 2 site  stereotactic biopsy for indeterminate calcifications within the lower inner quadrant of the RIGHT breast and subareolar RIGHT breast respectively. EXAM: 3D DIAGNOSTIC RIGHT MAMMOGRAM POST STEREOTACTIC BIOPSY x2 COMPARISON:  Previous exam(s). FINDINGS: 3D Mammographic images were obtained following stereotactic guided biopsy of indeterminate calcifications within the lower inner quadrant of the RIGHT breast followed by stereotactic guided biopsy of indeterminate calcifications within the lower subareolar RIGHT breast. The biopsy marking clips are in expected position at the sites of biopsy. IMPRESSION: 1. Appropriate positioning of the ribbon shaped biopsy marking clip at the site of biopsy in the lower inner quadrant of the RIGHT breast. 2. Appropriate positioning of the X shaped biopsy marking clip at the site of biopsy in the lower subareolar RIGHT breast. Final Assessment: Post Procedure Mammograms for Marker Placement Electronically Signed   By: Franki Cabot M.D.   On: 03/14/2022 11:43    No orders of the defined types were placed in this encounter.   All questions were answered. The patient knows to call the clinic with any problems, questions or concerns. The total time spent in the appointment was 40 minutes.     Truitt Merle, MD 03/21/2022   I, Wilburn Mylar, am acting as scribe for Truitt Merle, MD.   I have reviewed the above documentation for accuracy and completeness, and I agree with the above.

## 2022-03-22 ENCOUNTER — Encounter: Payer: Self-pay | Admitting: Genetic Counselor

## 2022-03-22 DIAGNOSIS — Z800 Family history of malignant neoplasm of digestive organs: Secondary | ICD-10-CM

## 2022-03-22 DIAGNOSIS — Z803 Family history of malignant neoplasm of breast: Secondary | ICD-10-CM

## 2022-03-22 HISTORY — DX: Family history of malignant neoplasm of breast: Z80.3

## 2022-03-22 HISTORY — DX: Family history of malignant neoplasm of digestive organs: Z80.0

## 2022-03-22 NOTE — Progress Notes (Signed)
REFERRING PROVIDER: Truitt Merle, MD [address] [place],  [place] [address]  PRIMARY PROVIDER:  Horald Pollen, MD  PRIMARY REASON FOR VISIT:  1. Ductal carcinoma in situ (DCIS) of right breast   2. H/O pheochromocytoma   3. Family history of breast cancer   4. Family history of pancreatic cancer     HISTORY OF PRESENT ILLNESS:   [name], a 76 y.o. female, was seen for a [facility] cancer genetics consultation at the request of Dr. Burr Medico due to a personal history of breast cancer and a personal history of a pheochromocytoma.  [name] presents to clinic today to discuss the possibility of a hereditary predisposition to cancer, to discuss genetic testing, and to further clarify her future cancer risks, as well as potential cancer risks for family members.   In 2023, at the age of 76, 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 The treatment plan is pending.  In 1996, in her early 69s, she was diagnosed with a pheochromocytoma s/p resection.  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  She denies presence of other neurofibromas, cafe au lait macules, or inguinal freckling.   CANCER HISTORY:  Oncology History Overview Note   Cancer Staging  Ductal carcinoma in situ (DCIS) of right breast Staging form: Breast, AJCC 8th Edition - Clinical stage from 03/14/2022: Stage 0 (cTis (DCIS), cN0, cM0, G1, ER+, PR+, HER2: Not Assessed) - Signed by FTruitt Merle MD on 03/20/2022    Ductal carcinoma in situ (DCIS) of right breast  02/19/2022 Mammogram   CLINICAL DATA:  76year old female presenting for 1 year follow-up of a nipple papilloma in the right breast. Given location ultrasound biopsy was not performed. Patient had surgical consultation and declined surgery.   EXAM: DIGITAL DIAGNOSTIC BILATERAL MAMMOGRAM WITH TOMOSYNTHESIS; ULTRASOUND RIGHT BREAST LIMITED  IMPRESSION: 1. New suspicious calcifications  in the retroareolar and medial aspects of the right breast.   2.  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   3.  No mammographic evidence of malignancy in the left breast.   03/14/2022 Cancer Staging   Staging form: Breast, AJCC 8th Edition - Clinical stage from 03/14/2022: Stage 0 (cTis (DCIS), cN0, cM0, G1, ER+, PR+, HER2: Not Assessed) - Signed by FTruitt Merle MD on 03/20/2022 Stage prefix: Initial diagnosis Histologic grading system: 3 grade system   03/14/2022 Initial Biopsy   Diagnosis 1. Breast, right, needle core biopsy, lower inner quadrant, ribbon clip - DUCTAL CARCINOMA IN SITU, LOW GRADE - NECROSIS: NOT IDENTIFIED - CALCIFICATIONS: PRESENT - DCIS LENGTH: AT LEAST 0.3 CM 2. 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 PROGNOSTIC INDICATORS Results: Estrogen Receptor: 100%, POSITIVE, STRONG STAINING INTENSITY Progesterone Receptor: 2%, POSITIVE, STRONG STAINING INTENSITY   03/19/2022 Initial Diagnosis   Ductal carcinoma in situ (DCIS) of right breast       Past Medical History:  Diagnosis Date   Atypical mole 02/05/2011   mild-Right post knee   [facility] cancer ([facility]    Family history of breast cancer 03/22/2022   Family history of pancreatic cancer 03/22/2022   Migraines    Thyroid disease     Past Surgical History:  Procedure Laterality Date   ABDOMINAL HYSTERECTOMY      FAMILY HISTORY:  We obtained a detailed, 4-generation  family history.  Significant diagnoses are listed below: Family History  Problem Relation Age of Onset   Lung cancer Mother        dx 76s   Breast cancer Maternal Aunt 55   Pancreatic cancer Maternal Aunt 80   Cancer Cousin        colon cancer   Other Other        great granddaughter; brain tumor @ birth; pituitary tumor @ age 87   Melanoma Son 50       shoulder            [name] has one son and one daughter.  Her son has a history of melanoma in his late 40s.  Her son's granddaughter was reported to have brain tumor at birth, which was resected around 24 weeks  of age.  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  [name] this that she has a "chromosomal condition" for which her granddaughter, Estill Bamberg, was negative.  No report was available for review today.   [name] mother died at age 59 and had lung cancer diagnosed at age 18.  [name] had a maternal aunt with pancreatic cancer (58s) and a maternal aunt with breast cancer (late 19s).  She reported essential tremor in her mother and maternal grandmother.   No cancer or tumors were reported in her paternal family.  Her father passed away at age 63, and she has two paternal half siblings without a known cancer history.   [name] is unaware of other previous family history of genetic testing for hereditary cancer risks. There is no reported Ashkenazi Jewish ancestry. There is no known consanguinity.  GENETIC COUNSELING ASSESSMENT: [name] is a 76 y.o. female with a history which is somewhat suggestive of a hereditary cancer syndrome and predisposition to cancer given her history of a pheochromocytoma and personal history of breast cancer with family history of pancreatic cancer. We, therefore, discussed and recommended the following at today's visit.   DISCUSSION: We discussed that 5 - 10% of cancer is hereditary.  Most cases of heredtiary breast and pancreatic cancer are associated with mutations in BRCA1/2.  There are other genes that can be associated with hereditary breast and pancreatic cancer syndromes.  We discussed that up to 40% of pheochromocytomas are hereditary, associated with genes including but not limited to the [facility] genes and NF1. We discussed that testing is beneficial for several reasons including knowing how to follow individuals for  their cancer/tumor risks and understanding if other family members could be at risk for cancer and allowing them to undergo genetic testing.   We reviewed the characteristics, features and inheritance patterns of hereditary cancer syndromes. We also discussed genetic testing, including the appropriate family members to test, the process of testing, insurance coverage and turn-around-time for results. We discussed the implications of a negative, positive, carrier and/or variant of uncertain significant result. We recommended Ms. Nedrow pursue genetic testing for a panel that includes genes associated with breast cancer, pancreatic cancer, pheochromocytomas, and other cancers/tumors.   The CustomNext-Cancer +RNAinsight Panel offered by [facility] and includes sequencing and rearrangement analysis for the following 91 genes: AIP, ALK, APC, ATM, AXIN2, BAP1, BARD1, BLM, BMPR1A, BRCA1, BRCA2, BRIP1, CDC73, CDH1, CDK4, CDKN1B, CDKN2A, CHEK2, CTNNA1, DICER1, FANCC, FH, FLCN, GALNT12, KIF1B, LZTR1, MAX, MEN1, MET, MLH1, MRE11A, MSH2, MSH3, MSH6, MUTYH, NBN, NF1, NF2, NTHL1, PALB2, PHOX2B, PMS2, POT1, PRKAR1A, PTCH1, PTEN, RAD50, RAD51C, RAD51D, RB1,  RECQL, RET, SDHA, SDHAF2, SDHB, SDHC, SDHD, SMAD4, SMARCA4, SMARCB1, SMARCE1, STK11, SUFU, TMEM127, TP53, TSC1, TSC2, VHL and XRCC2 (sequencing and deletion/duplication); CASR, CFTR, CPA1, CTRC, EGFR, EGLN1, FAM175A, HOXB13, KIT, MITF, MLH3, PALLD, PDGFRA, POLD1, POLE, PRSS1, RINT1, RPS20, SPINK1 and TERT (sequencing only); EPCAM and GREM1 (deletion/duplication only). RNA data is routinely analyzed for use in variant interpretation for all genes.  Based on Ms. Esch's personal history of breast cancer and personal history of pheochromocytomas, she meets medical criteria for genetic testing.   PLAN: After considering the risks, benefits, and limitations, Ms. Yerkes provided informed consent to pursue genetic testing and the blood sample was sent to [facility]  for analysis of the CustomNext-Cancer +RNAinsight Panel. Results should be available within approximately 3 weeks' time, at which point they will be disclosed by telephone to Ms. Morici, as will any additional recommendations warranted by these results. Ms. Zollinger will receive a summary of her genetic counseling visit and a copy of her results once available. This information will also be available in Epic.   Ms. Czerwonka questions were answered to her satisfaction today. Our contact information was provided should additional questions or concerns arise. Thank you for the referral and allowing [place] to share in the care of your patient.   Yennifer Segovia M. Joette Catching, [place], [facility] Genetic Counselor [email or web address] (P) [phone]  The patient was seen for a total of 10 minutes of face-to-face genetic counseling and 30 minutes of telephone only genetic counseling.  She was accompanied by her granddaughter, [name]. Drs. Lindi Adie and/or Burr Medico were available to discuss this case as needed.    _______________________________________________________________________ For Office Staff:  Number of people involved in session: 2 Was an Intern/ student involved with case: no

## 2022-03-23 ENCOUNTER — Other Ambulatory Visit: Payer: Self-pay | Admitting: General Surgery

## 2022-03-23 DIAGNOSIS — D0511 Intraductal carcinoma in situ of right breast: Secondary | ICD-10-CM

## 2022-03-28 ENCOUNTER — Telehealth: Payer: Self-pay

## 2022-03-28 NOTE — Telephone Encounter (Signed)
MTG-015 - Tissue and Bodily Fluids: Translational Medicine: Discovery and Evaluation of Biomarkers/Pharmacogenomics for the Diagnosis and Personalized Management of Patients   Called patient to follow-up on above-listed study. Patient declined participation at this time. Patient thanked for her time and consideration.  Vickii Penna, RN, BSN, CPN Clinical Research Nurse I [phone]  03/28/2022 12:54 PM

## 2022-03-29 ENCOUNTER — Encounter: Payer: Self-pay | Admitting: *Deleted

## 2022-03-29 NOTE — Telephone Encounter (Signed)
Spoke to pt regarding [name] from 03/21/22. Denies questions or concerns regarding dx or treatment care plan. Encourage pt to call with needs. Received verbal understanding.

## 2022-04-02 ENCOUNTER — Telehealth: Payer: Self-pay | Admitting: Hematology

## 2022-04-02 NOTE — Telephone Encounter (Signed)
Called patient per 11/6 in basket. Scheduled appointment and patient notified.

## 2022-04-09 ENCOUNTER — Encounter (HOSPITAL_BASED_OUTPATIENT_CLINIC_OR_DEPARTMENT_OTHER): Payer: Self-pay | Admitting: General Surgery

## 2022-04-09 ENCOUNTER — Other Ambulatory Visit: Payer: Self-pay

## 2022-04-10 DIAGNOSIS — D0511 Intraductal carcinoma in situ of right breast: Secondary | ICD-10-CM | POA: Diagnosis not present

## 2022-04-17 DIAGNOSIS — Z01818 Encounter for other preprocedural examination: Secondary | ICD-10-CM

## 2022-04-18 ENCOUNTER — Other Ambulatory Visit: Payer: Self-pay

## 2022-04-18 ENCOUNTER — Encounter: Payer: Self-pay | Admitting: Genetic Counselor

## 2022-04-18 ENCOUNTER — Ambulatory Visit: Payer: Self-pay | Admitting: Genetic Counselor

## 2022-04-18 ENCOUNTER — Telehealth: Payer: Self-pay | Admitting: Genetic Counselor

## 2022-04-18 ENCOUNTER — Encounter (HOSPITAL_BASED_OUTPATIENT_CLINIC_OR_DEPARTMENT_OTHER): Payer: Self-pay | Admitting: General Surgery

## 2022-04-18 DIAGNOSIS — D0511 Intraductal carcinoma in situ of right breast: Secondary | ICD-10-CM

## 2022-04-18 DIAGNOSIS — Z86018 Personal history of other benign neoplasm: Secondary | ICD-10-CM

## 2022-04-18 DIAGNOSIS — Z1379 Encounter for other screening for genetic and chromosomal anomalies: Secondary | ICD-10-CM

## 2022-04-18 DIAGNOSIS — Z803 Family history of malignant neoplasm of breast: Secondary | ICD-10-CM

## 2022-04-18 DIAGNOSIS — Z800 Family history of malignant neoplasm of digestive organs: Secondary | ICD-10-CM

## 2022-04-18 NOTE — Telephone Encounter (Signed)
Revealed negative genetics.   

## 2022-04-18 NOTE — Telephone Encounter (Signed)
Contacted patient in attempt to disclose results of genetic testing.  LVM with contact information requesting a call back.  

## 2022-04-18 NOTE — Progress Notes (Signed)
HPI:   Ms. Albertsen was previously seen in the [facility] clinic due to a personal and family history of cancer and concerns regarding a hereditary predisposition to cancer. Please refer to our prior cancer genetics clinic note for more information regarding our discussion, assessment and recommendations, at the time. Ms. Swallow recent genetic test results were disclosed to her, as were recommendations warranted by these results. These results and recommendations are discussed in more detail below.  In 2023, at the age of 76, Ms. Sherpa was diagnosed with ductal carcinoma in situ of the right breast (ER+/PR+). The treatment plan is pending.  In 1996, in her early 29s, she was diagnosed with a pheochromocytoma s/p resection.  She also has a history of a 21m neurofibroma on her back, detected in 2022.  She denies presence of other neurofibromas, cafe au lait macules, or inguinal freckling.   CANCER HISTORY:  Oncology History Overview Note   Cancer Staging  Ductal carcinoma in situ (DCIS) of right breast Staging form: Breast, AJCC 8th Edition - Clinical stage from 03/14/2022: Stage 0 (cTis (DCIS), cN0, cM0, G1, ER+, PR+, HER2: Not Assessed) - Signed by FTruitt Merle MD on 03/20/2022    Ductal carcinoma in situ (DCIS) of right breast  02/19/2022 Mammogram   CLINICAL DATA:  76year old female presenting for 1 year follow-up of a nipple papilloma in the right breast. Given location ultrasound biopsy was not performed. Patient had surgical consultation and declined surgery.   EXAM: DIGITAL DIAGNOSTIC BILATERAL MAMMOGRAM WITH TOMOSYNTHESIS; ULTRASOUND RIGHT BREAST LIMITED  IMPRESSION: 1. New suspicious calcifications in the retroareolar and medial aspects of the right breast.   2.  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   3.  No mammographic evidence of malignancy in the left breast.   03/14/2022 Cancer Staging   Staging form: Breast, AJCC 8th Edition -  Clinical stage from 03/14/2022: Stage 0 (cTis (DCIS), cN0, cM0, G1, ER+, PR+, HER2: Not Assessed) - Signed by FTruitt Merle MD on 03/20/2022 Stage prefix: Initial diagnosis Histologic grading system: 3 grade system   03/14/2022 Initial Biopsy   Diagnosis 1. Breast, right, needle core biopsy, lower inner quadrant, ribbon clip - DUCTAL CARCINOMA IN SITU, LOW GRADE - NECROSIS: NOT IDENTIFIED - CALCIFICATIONS: PRESENT - DCIS LENGTH: AT LEAST 0.3 CM 2. 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 PROGNOSTIC INDICATORS Results: Estrogen Receptor: 100%, POSITIVE, STRONG STAINING INTENSITY Progesterone Receptor: 2%, POSITIVE, STRONG STAINING INTENSITY   03/19/2022 Initial Diagnosis   Ductal carcinoma in situ (DCIS) of right breast   04/12/2022 Genetic Testing   Negative genetic testing: no pathogenic variants detected in [facility] CustomNext-Cancer +RNAinsight Panel.  Report date is 04/12/2022.  The CustomNext-Cancer +RNAinsight Panel [facility] includes sequencing and rearrangement analysis for the following 91 genes: AIP, ALK, APC, ATM, AXIN2, BAP1, BARD1, BLM, BMPR1A, BRCA1, BRCA2, BRIP1, CDC73, CDH1, CDK4, CDKN1B, CDKN2A, CHEK2, CTNNA1, DICER1, FANCC, FH, FLCN, GALNT12, KIF1B, LZTR1, MAX, MEN1, MET, MLH1, MRE11A, MSH2, MSH3, MSH6, MUTYH, NBN, NF1, NF2, NTHL1, PALB2, PHOX2B, PMS2, POT1, PRKAR1A, PTCH1, PTEN, RAD50, RAD51C, RAD51D, RB1, RECQL, RET, SDHA, SDHAF2, SDHB, SDHC, SDHD, SMAD4, SMARCA4, SMARCB1, SMARCE1, STK11, SUFU, TMEM127, TP53, TSC1, TSC2, VHL and XRCC2 (sequencing and deletion/duplication); CASR, CFTR, CPA1, CTRC, EGFR, EGLN1, FAM175A, HOXB13, KIT, MITF, MLH3, PALLD, PDGFRA, POLD1, POLE, PRSS1, RINT1, RPS20, SPINK1 and TERT (sequencing only); EPCAM and  GREM1 (deletion/duplication only). RNA data is routinely analyzed for use in variant  interpretation for all genes.      FAMILY HISTORY:  We obtained a detailed, 4-generation family history.  Significant diagnoses are listed below:      Family History  Problem Relation Age of Onset   Lung cancer Mother          dx 72s   Breast cancer Maternal Aunt 55   Pancreatic cancer Maternal Aunt 80   Cancer Cousin          colon cancer   Other Other          great granddaughter; brain tumor @ birth; pituitary tumor @ age 22   Melanoma Son 33        shoulder             Ms. Van has one son and one daughter.  Her son has a history of melanoma in his late 13s.  Her son's granddaughter was reported to have brain tumor at birth, which was resected around 27 weeks  of age.  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  Ms. Belcher this that she has a "chromosomal condition" for which her granddaughter, Estill Bamberg, was negative.  No report was available for review today.    Ms. Glore mother died at age 49 and had lung cancer diagnosed at age 41.  Ms. Nierenberg had a maternal aunt with pancreatic cancer (22s) and a maternal aunt with breast cancer (late 71s).  She reported essential tremor in her mother and maternal grandmother.    No cancer or tumors were reported in her paternal family.  Her father passed away at age 65, and she has two paternal half siblings without a known cancer history.    Ms. Guidry is unaware of other previous family history of genetic testing for hereditary cancer risks. There is no reported Ashkenazi Jewish ancestry. There is no known consanguinity.  GENETIC TEST RESULTS:  The [facility] CustomNext-Cancer +RNAinsight Panel found no pathogenic mutations.    The CustomNext-Cancer +RNAinsight Panel offered by [facility] and includes sequencing and rearrangement analysis for the following 91 genes: AIP, ALK, APC, ATM, AXIN2, BAP1, BARD1, BLM, BMPR1A, BRCA1, BRCA2, BRIP1, CDC73, CDH1, CDK4, CDKN1B, CDKN2A, CHEK2, CTNNA1, DICER1, FANCC,  FH, FLCN, GALNT12, KIF1B, LZTR1, MAX, MEN1, MET, MLH1, MRE11A, MSH2, MSH3, MSH6, MUTYH, NBN, NF1, NF2, NTHL1, PALB2, PHOX2B, PMS2, POT1, PRKAR1A, PTCH1, PTEN, RAD50, RAD51C, RAD51D, RB1, RECQL, RET, SDHA, SDHAF2, SDHB, SDHC, SDHD, SMAD4, SMARCA4, SMARCB1, SMARCE1, STK11, SUFU, TMEM127, TP53, TSC1, TSC2, VHL and XRCC2 (sequencing and deletion/duplication); CASR, CFTR, CPA1, CTRC, EGFR, EGLN1, FAM175A, HOXB13, KIT, MITF, MLH3, PALLD, PDGFRA, POLD1, POLE, PRSS1, RINT1, RPS20, SPINK1 and TERT (sequencing only); EPCAM and GREM1 (deletion/duplication only). RNA data is routinely analyzed for use in variant interpretation for all genes. .   The test report has been scanned into EPIC and is located under the Molecular Pathology section of the Results Review tab.  A portion of the result report is included below for reference. Genetic testing reported out on April 12, 2022.       Even though a pathogenic variant was not identified, possible explanations for the cancer in the family may include: There may be no hereditary risk for cancer in the family. The cancers in Ms. Tarkowski and/or her family may be sporadic/familial or due to other genetic and environmental factors. There may be a gene mutation in one of these genes that  current testing methods cannot detect but that chance is small. There could be another gene that has not yet been discovered, or that we have not yet tested, that is responsible for the cancer diagnoses in the family.  It is also possible there is a hereditary cause for the cancer in the family that Ms. Umland did not inherit.   Therefore, it is important to remain in touch with cancer genetics in the future so that we can continue to offer Ms. Henriques the most up to date genetic testing.    ADDITIONAL GENETIC TESTING:  We discussed with Ms. Johannesen that her genetic testing was fairly extensive.  If there are additional relevant genes identified to increase cancer risk that can be  analyzed in the future, we would be happy to discuss and coordinate this testing at that time.     CANCER SCREENING RECOMMENDATIONS:  Ms. Cobarrubias's test result is considered negative (normal).  This means that we have not identified a hereditary cause for her personal history of breast cancer or her personal history of pheochromocytoma at this time.   An individual's cancer risk and medical management are not determined by genetic test results alone. Overall cancer risk assessment incorporates additional factors, including personal medical history, family history, and any available genetic information that may result in a personalized plan for cancer prevention and surveillance. Therefore, it is recommended she continue to follow the cancer management and screening guidelines provided by her oncology and primary healthcare provider.  RECOMMENDATIONS FOR FAMILY MEMBERS:   Since she did not inherit a identifiable mutation in a cancer predisposition gene included on this panel, her children could not have inherited a known mutation from her in one of these genes. Individuals in this family might be at some increased risk of developing cancer, over the general population risk, due to the family history of cancer.  Individuals in the family should notify their providers of the family history of cancer. We recommend women in this family have a yearly mammogram beginning at age 40, or 10 years younger than the earliest onset of cancer, an annual clinical breast exam, and perform monthly breast self-exams.     FOLLOW-UP:  Lastly, we discussed with Ms. Zweber that cancer genetics is a rapidly advancing field and it is possible that new genetic tests will be appropriate for her and/or her family members in the future. We encouraged her to remain in contact with cancer genetics on an annual basis so we can update her personal and family histories and let her know of advances in cancer genetics that may benefit  this family.   Our contact number was provided. Ms. Powell's questions were answered to her satisfaction, and she knows she is welcome to call us at anytime with additional questions or concerns.   Cari M. Koerner, MS, LCGC Genetic Counselor [email or web address] (P) [phone]  

## 2022-04-25 ENCOUNTER — Encounter: Payer: Self-pay | Admitting: *Deleted

## 2022-04-26 ENCOUNTER — Ambulatory Visit
Admission: RE | Admit: 2022-04-26 | Discharge: 2022-04-26 | Disposition: A | Discharge status: Home / Self Care | Payer: Medicare Other | Source: Ambulatory Visit | Attending: General Surgery | Admitting: General Surgery

## 2022-04-26 DIAGNOSIS — R928 Other abnormal and inconclusive findings on diagnostic imaging of breast: Secondary | ICD-10-CM | POA: Diagnosis not present

## 2022-04-26 DIAGNOSIS — D0511 Intraductal carcinoma in situ of right breast: Secondary | ICD-10-CM | POA: Diagnosis not present

## 2022-04-26 HISTORY — PX: BREAST BIOPSY: SHX20

## 2022-04-27 MED ORDER — ENSURE PRE-SURGERY PO LIQD: 296.0000 mL | Freq: Once | ORAL | Status: DC

## 2022-04-27 NOTE — Progress Notes (Signed)

## 2022-04-30 ENCOUNTER — Encounter (HOSPITAL_BASED_OUTPATIENT_CLINIC_OR_DEPARTMENT_OTHER)
Admission: RE | Disposition: A | Discharge status: Home / Self Care | Payer: Self-pay | Source: Home / Self Care | Attending: General Surgery

## 2022-04-30 ENCOUNTER — Encounter (HOSPITAL_BASED_OUTPATIENT_CLINIC_OR_DEPARTMENT_OTHER): Payer: Self-pay | Admitting: General Surgery

## 2022-04-30 ENCOUNTER — Ambulatory Visit (HOSPITAL_BASED_OUTPATIENT_CLINIC_OR_DEPARTMENT_OTHER): Payer: Medicare Other | Admitting: Anesthesiology

## 2022-04-30 ENCOUNTER — Ambulatory Visit
Admission: RE | Admit: 2022-04-30 | Discharge: 2022-04-30 | Disposition: A | Discharge status: Home / Self Care | Payer: Medicare Other | Source: Ambulatory Visit | Attending: General Surgery | Admitting: General Surgery

## 2022-04-30 ENCOUNTER — Ambulatory Visit (HOSPITAL_BASED_OUTPATIENT_CLINIC_OR_DEPARTMENT_OTHER)
Admission: RE | Admit: 2022-04-30 | Discharge: 2022-04-30 | Disposition: A | Discharge status: Home / Self Care | Payer: Medicare Other | Attending: General Surgery | Admitting: General Surgery

## 2022-04-30 ENCOUNTER — Other Ambulatory Visit: Payer: Self-pay

## 2022-04-30 DIAGNOSIS — D0511 Intraductal carcinoma in situ of right breast: Secondary | ICD-10-CM

## 2022-04-30 DIAGNOSIS — E039 Hypothyroidism, unspecified: Secondary | ICD-10-CM | POA: Diagnosis not present

## 2022-04-30 DIAGNOSIS — D241 Benign neoplasm of right breast: Secondary | ICD-10-CM | POA: Diagnosis not present

## 2022-04-30 DIAGNOSIS — Z170 Estrogen receptor positive status [ER+]: Secondary | ICD-10-CM | POA: Insufficient documentation

## 2022-04-30 DIAGNOSIS — R928 Other abnormal and inconclusive findings on diagnostic imaging of breast: Secondary | ICD-10-CM | POA: Diagnosis not present

## 2022-04-30 DIAGNOSIS — Z01818 Encounter for other preprocedural examination: Secondary | ICD-10-CM

## 2022-04-30 DIAGNOSIS — I10 Essential (primary) hypertension: Secondary | ICD-10-CM | POA: Diagnosis not present

## 2022-04-30 DIAGNOSIS — Z87891 Personal history of nicotine dependence: Secondary | ICD-10-CM | POA: Insufficient documentation

## 2022-04-30 DIAGNOSIS — N6011 Diffuse cystic mastopathy of right breast: Secondary | ICD-10-CM | POA: Diagnosis not present

## 2022-04-30 HISTORY — PX: BREAST LUMPECTOMY WITH RADIOACTIVE SEED LOCALIZATION: SHX6424

## 2022-04-30 SURGERY — BREAST LUMPECTOMY WITH RADIOACTIVE SEED LOCALIZATION
Anesthesia: General | Site: Breast | Laterality: Right

## 2022-04-30 MED ORDER — PHENYLEPHRINE 80 MCG/ML (10ML) SYRINGE FOR IV PUSH (FOR BLOOD PRESSURE SUPPORT)
PREFILLED_SYRINGE | INTRAVENOUS | Status: AC
  Filled 2022-04-30: qty 10

## 2022-04-30 MED ORDER — ACETAMINOPHEN 500 MG PO TABS
1000.0000 mg | ORAL_TABLET | ORAL | Status: AC
  Administered 2022-04-30: 1000 mg via ORAL

## 2022-04-30 MED ORDER — FENTANYL CITRATE (PF) 100 MCG/2ML IJ SOLN
INTRAMUSCULAR | Status: AC
  Filled 2022-04-30: qty 2

## 2022-04-30 MED ORDER — ACETAMINOPHEN 500 MG PO TABS
ORAL_TABLET | ORAL | Status: AC
  Filled 2022-04-30: qty 2

## 2022-04-30 MED ORDER — OXYCODONE HCL 5 MG PO TABS
ORAL_TABLET | ORAL | Status: AC
  Filled 2022-04-30: qty 1

## 2022-04-30 MED ORDER — CEFAZOLIN SODIUM-DEXTROSE 2-4 GM/100ML-% IV SOLN
2.0000 g | INTRAVENOUS | Status: AC
  Administered 2022-04-30: 2 g via INTRAVENOUS

## 2022-04-30 MED ORDER — OXYCODONE HCL 5 MG/5ML PO SOLN: 5.0000 mg | Freq: Once | ORAL | Status: AC | PRN

## 2022-04-30 MED ORDER — LACTATED RINGERS IV SOLN: INTRAVENOUS | Status: DC

## 2022-04-30 MED ORDER — PROPOFOL 10 MG/ML IV BOLUS
INTRAVENOUS | Status: DC | PRN
  Administered 2022-04-30: 130 mg via INTRAVENOUS

## 2022-04-30 MED ORDER — CEFAZOLIN SODIUM-DEXTROSE 2-4 GM/100ML-% IV SOLN
INTRAVENOUS | Status: AC
  Filled 2022-04-30: qty 100

## 2022-04-30 MED ORDER — BUPIVACAINE HCL (PF) 0.25 % IJ SOLN
INTRAMUSCULAR | Status: DC | PRN
  Administered 2022-04-30: 10 mL

## 2022-04-30 MED ORDER — TRAMADOL HCL 50 MG PO TABS: 50.0000 mg | ORAL_TABLET | Freq: Four times a day (QID) | ORAL | 0 refills | Status: DC | PRN

## 2022-04-30 MED ORDER — LIDOCAINE HCL (CARDIAC) PF 100 MG/5ML IV SOSY
PREFILLED_SYRINGE | INTRAVENOUS | Status: DC | PRN
  Administered 2022-04-30: 60 mg via INTRAVENOUS

## 2022-04-30 MED ORDER — FENTANYL CITRATE (PF) 100 MCG/2ML IJ SOLN
25.0000 ug | INTRAMUSCULAR | Status: DC | PRN
  Administered 2022-04-30: 25 ug via INTRAVENOUS
  Administered 2022-04-30: 50 ug via INTRAVENOUS
  Administered 2022-04-30: 25 ug via INTRAVENOUS

## 2022-04-30 MED ORDER — CHLORHEXIDINE GLUCONATE CLOTH 2 % EX PADS: 6.0000 | MEDICATED_PAD | Freq: Once | CUTANEOUS | Status: DC

## 2022-04-30 MED ORDER — MIDAZOLAM HCL 2 MG/2ML IJ SOLN
INTRAMUSCULAR | Status: AC
  Filled 2022-04-30: qty 2

## 2022-04-30 MED ORDER — LIDOCAINE 2% (20 MG/ML) 5 ML SYRINGE
INTRAMUSCULAR | Status: AC
  Filled 2022-04-30: qty 5

## 2022-04-30 MED ORDER — ONDANSETRON HCL 4 MG/2ML IJ SOLN: 4.0000 mg | Freq: Once | INTRAMUSCULAR | Status: DC | PRN

## 2022-04-30 MED ORDER — OXYCODONE HCL 5 MG PO TABS
5.0000 mg | ORAL_TABLET | Freq: Once | ORAL | Status: AC | PRN
  Administered 2022-04-30: 5 mg via ORAL

## 2022-04-30 MED ORDER — DEXAMETHASONE SODIUM PHOSPHATE 10 MG/ML IJ SOLN
INTRAMUSCULAR | Status: AC
  Filled 2022-04-30: qty 1

## 2022-04-30 MED ORDER — ONDANSETRON HCL 4 MG/2ML IJ SOLN
INTRAMUSCULAR | Status: DC | PRN
  Administered 2022-04-30: 4 mg via INTRAVENOUS

## 2022-04-30 MED ORDER — ONDANSETRON HCL 4 MG/2ML IJ SOLN
INTRAMUSCULAR | Status: AC
  Filled 2022-04-30: qty 2

## 2022-04-30 MED ORDER — PROPOFOL 500 MG/50ML IV EMUL
INTRAVENOUS | Status: DC | PRN
  Administered 2022-04-30: 150 ug/kg/min via INTRAVENOUS

## 2022-04-30 MED ORDER — PHENYLEPHRINE HCL (PRESSORS) 10 MG/ML IV SOLN
INTRAVENOUS | Status: DC | PRN
  Administered 2022-04-30: 80 ug via INTRAVENOUS

## 2022-04-30 MED ORDER — PROPOFOL 10 MG/ML IV BOLUS
INTRAVENOUS | Status: AC
  Filled 2022-04-30: qty 20

## 2022-04-30 MED ORDER — EPHEDRINE 5 MG/ML INJ
INTRAVENOUS | Status: AC
  Filled 2022-04-30: qty 5

## 2022-04-30 MED ORDER — DEXAMETHASONE SODIUM PHOSPHATE 4 MG/ML IJ SOLN
INTRAMUSCULAR | Status: DC | PRN
  Administered 2022-04-30: 5 mg via INTRAVENOUS

## 2022-04-30 SURGICAL SUPPLY — 55 items
ADH SKN CLS APL DERMABOND .7 (GAUZE/BANDAGES/DRESSINGS) ×1
APL PRP STRL LF DISP 70% ISPRP (MISCELLANEOUS) ×1
APPLIER CLIP 9.375 MED OPEN (MISCELLANEOUS) ×1
APR CLP MED 9.3 20 MLT OPN (MISCELLANEOUS) ×1
BINDER BREAST LRG (GAUZE/BANDAGES/DRESSINGS) IMPLANT
BINDER BREAST MEDIUM (GAUZE/BANDAGES/DRESSINGS) IMPLANT
BINDER BREAST XLRG (GAUZE/BANDAGES/DRESSINGS) IMPLANT
BINDER BREAST XXLRG (GAUZE/BANDAGES/DRESSINGS) IMPLANT
BLADE SURG 15 STRL LF DISP TIS (BLADE) ×1 IMPLANT
BLADE SURG 15 STRL SS (BLADE) ×1
CANISTER SUC SOCK COL 7IN (MISCELLANEOUS) IMPLANT
CANISTER SUCT 1200ML W/VALVE (MISCELLANEOUS) IMPLANT
CHLORAPREP W/TINT 26 (MISCELLANEOUS) ×1 IMPLANT
CLIP APPLIE 9.375 MED OPEN (MISCELLANEOUS) IMPLANT
CLIP TI WIDE RED SMALL 6 (CLIP) IMPLANT
COVER BACK TABLE 60X90IN (DRAPES) ×1 IMPLANT
COVER MAYO STAND STRL (DRAPES) ×1 IMPLANT
COVER PROBE CYLINDRICAL 5X96 (MISCELLANEOUS) ×1 IMPLANT
DERMABOND ADVANCED .7 DNX12 (GAUZE/BANDAGES/DRESSINGS) ×1 IMPLANT
DRAPE LAPAROSCOPIC ABDOMINAL (DRAPES) ×1 IMPLANT
DRAPE UTILITY XL STRL (DRAPES) ×1 IMPLANT
DRSG TEGADERM 4X4.75 (GAUZE/BANDAGES/DRESSINGS) IMPLANT
ELECT COATED BLADE 2.86 ST (ELECTRODE) ×1 IMPLANT
ELECT REM PT RETURN 9FT ADLT (ELECTROSURGICAL) ×1
ELECTRODE REM PT RTRN 9FT ADLT (ELECTROSURGICAL) ×1 IMPLANT
GAUZE SPONGE 4X4 12PLY STRL LF (GAUZE/BANDAGES/DRESSINGS) IMPLANT
GLOVE BIO SURGEON STRL SZ7 (GLOVE) ×2 IMPLANT
GLOVE BIOGEL PI IND STRL 7.5 (GLOVE) ×1 IMPLANT
GOWN STRL REUS W/ TWL LRG LVL3 (GOWN DISPOSABLE) ×2 IMPLANT
GOWN STRL REUS W/TWL LRG LVL3 (GOWN DISPOSABLE) ×2
HEMOSTAT ARISTA ABSORB 3G PWDR (HEMOSTASIS) IMPLANT
KIT MARKER MARGIN INK (KITS) ×1 IMPLANT
NDL HYPO 25X1 1.5 SAFETY (NEEDLE) ×1 IMPLANT
NEEDLE HYPO 25X1 1.5 SAFETY (NEEDLE) ×1 IMPLANT
NS IRRIG 1000ML POUR BTL (IV SOLUTION) IMPLANT
PACK BASIN DAY SURGERY FS (CUSTOM PROCEDURE TRAY) ×1 IMPLANT
PENCIL SMOKE EVACUATOR (MISCELLANEOUS) ×1 IMPLANT
RETRACTOR ONETRAX LX 90X20 (MISCELLANEOUS) IMPLANT
SLEEVE SCD COMPRESS KNEE MED (STOCKING) ×1 IMPLANT
SPIKE FLUID TRANSFER (MISCELLANEOUS) IMPLANT
SPONGE T-LAP 4X18 ~~LOC~~+RFID (SPONGE) ×1 IMPLANT
STRIP CLOSURE SKIN 1/2X4 (GAUZE/BANDAGES/DRESSINGS) ×1 IMPLANT
SUT MNCRL AB 4-0 PS2 18 (SUTURE) ×1 IMPLANT
SUT MON AB 5-0 PS2 18 (SUTURE) IMPLANT
SUT SILK 2 0 SH (SUTURE) IMPLANT
SUT VIC AB 2-0 SH 27 (SUTURE) ×2
SUT VIC AB 2-0 SH 27XBRD (SUTURE) ×1 IMPLANT
SUT VIC AB 3-0 SH 27 (SUTURE) ×1
SUT VIC AB 3-0 SH 27X BRD (SUTURE) ×1 IMPLANT
SUT VIC AB 5-0 PS2 18 (SUTURE) IMPLANT
SYR CONTROL 10ML LL (SYRINGE) ×1 IMPLANT
TOWEL GREEN STERILE FF (TOWEL DISPOSABLE) ×1 IMPLANT
TRAY FAXITRON CT DISP (TRAY / TRAY PROCEDURE) ×1 IMPLANT
TUBE CONNECTING 20X1/4 (TUBING) IMPLANT
YANKAUER SUCT BULB TIP NO VENT (SUCTIONS) IMPLANT

## 2022-04-30 NOTE — Transfer of Care (Signed)
Immediate Anesthesia Transfer of Care Note  Patient: Julie Pacheco  Procedure(s) Performed: RIGHT BREAST BRACKETED LUMPECTOMY WITH RADIOACTIVE SEED LOCALIZATION (Right: Breast)  Patient Location: PACU  Anesthesia Type:General  Level of Consciousness: awake, alert , and oriented  Airway & Oxygen Therapy: Patient Spontanous Breathing and Patient connected to face mask oxygen  Post-op Assessment: Report given to RN and Post -op Vital signs reviewed and stable  Post vital signs: Reviewed and stable  Last Vitals:  Vitals Value Taken Time  BP 128/56 04/30/22 1022  Temp    Pulse 52 04/30/22 1023  Resp 16 04/30/22 1023  SpO2 100 % 04/30/22 1023  Vitals shown include unvalidated device data.  Last Pain:  Vitals:   04/30/22 0729  TempSrc: Oral  PainSc: 0-No pain      Patients Stated Pain Goal: 3 (58/30/94 0768)  Complications: No notable events documented.

## 2022-04-30 NOTE — Op Note (Signed)
Preoperative diagnosis: Right breast ductal carcinoma in situ Postoperative diagnosis: Same as above Procedure: Right breast radioactive seed bracketed lumpectomy Surgeon: Dr. Serita Grammes Anesthesia: General Estimated blood loss: Minimal Complications: None Drains: None Specimens: 1.  Right breast tissue marked with paint containing 2 seeds and 2 clips 2.  Anterior nipple margin marked with paint 3.  Additional posterior, lateral, superior margins marked short superior, long lateral, double deep Sponge needle count was correct at completion Disposition recovery stable condition  Indications:76 yof I know from discharge that was expressed/clear and had known papilloma we decided to monitor. She had follow up mm and this showed medial right breast calcs measuring 1.9 cm and another retroareolar calcs measuring 1.2 cm. Biopsy of the 1.9 cm area is LG DCIS that is 100% er pos, 2% pr pos. Biopsy of other calcs is ADH/papilloma. Clips are 2.7 cm apart. she had large hematoma and we waited until this resolved to do surgery.  She had seeds placed at both of these locations.  I discussed removing the entire undersurface of the nipple and awaiting pathology before we decided to do anything else.  Procedure: After informed consent was obtained she was taken to the operating room.  She was given antibiotics.  SCDs were in place.  She was placed under general anesthesia without complication.  She was prepped and draped in the standard sterile surgical fashion.  A surgical timeout was then performed.  I located the seeds and made a inferior periareolar incision in order to hide the scar later.  I then used the neoprobe to remove both seeds with some surrounding tissue and attempt to get a clear margin.  Mammogram confirmed removal of the seeds as well as the clips.  This had been marked with paint.  I then remove the entire undersurface of the nipple and sent this off as a separate specimen.  This is just  the areola and nipple now.  I then did a 3D imaging and thought I might be close to several margin so I remove these as above.  I then obtained hemostasis.  I closed the breast tissue with 2-0 Vicryl.  The skin was closed with 3-0 Vicryl and 5-0 Monocryl.  Glue and Steri-Strips were applied.  She tolerated this well and was transferred to recovery stable.

## 2022-04-30 NOTE — Interval H&P Note (Signed)
History and Physical Interval Note:  04/30/2022 8:56 AM  Julie Pacheco  has presented today for surgery, with the diagnosis of RIGHT BREAST DCIS.  The various methods of treatment have been discussed with the patient and family. After consideration of risks, benefits and other options for treatment, the patient has consented to  Procedure(s): RIGHT BREAST BRACKETED LUMPECTOMY WITH RADIOACTIVE SEED LOCALIZATION (Right) as a surgical intervention.  The patient's history has been reviewed, patient examined, no change in status, stable for surgery.  I have reviewed the patient's chart and labs.  Questions were answered to the patient's satisfaction.     Rolm Bookbinder

## 2022-04-30 NOTE — Anesthesia Preprocedure Evaluation (Addendum)
Anesthesia Evaluation  Patient identified by MRN, date of birth, ID band Patient awake    Reviewed: Allergy & Precautions, NPO status , Patient's Chart, lab work & pertinent test results  History of Anesthesia Complications Negative for: history of anesthetic complications  Airway Mallampati: II  TM Distance: >3 FB Neck ROM: Full    Dental  (+) Dental Advisory Given, Upper Dentures, Partial Lower   Pulmonary former smoker   Pulmonary exam normal        Cardiovascular hypertension, Normal cardiovascular exam     Neuro/Psych  Headaches PSYCHIATRIC DISORDERS Anxiety        GI/Hepatic negative GI ROS, Neg liver ROS,,,  Endo/Other  Hypothyroidism   Hx pheochromocytoma s/p resection    Renal/GU negative Renal ROS     Musculoskeletal negative musculoskeletal ROS (+)    Abdominal   Peds  Hematology negative hematology ROS (+)   Anesthesia Other Findings   Reproductive/Obstetrics  Right breast DCIS                              Anesthesia Physical Anesthesia Plan  ASA: 2  Anesthesia Plan: General   Post-op Pain Management: Tylenol PO (pre-op)*   Induction: Intravenous  PONV Risk Score and Plan: 3 and Treatment may vary due to age or medical condition, Ondansetron and Propofol infusion  Airway Management Planned: LMA  Additional Equipment: None  Intra-op Plan:   Post-operative Plan: Extubation in OR  Informed Consent: I have reviewed the patients History and Physical, chart, labs and discussed the procedure including the risks, benefits and alternatives for the proposed anesthesia with the patient or authorized representative who has indicated his/her understanding and acceptance.     Dental advisory given  Plan Discussed with: CRNA and Anesthesiologist  Anesthesia Plan Comments:        Anesthesia Quick Evaluation

## 2022-04-30 NOTE — H&P (Signed)
  64 yof I know from discharge that was expressed/clear and had known papilloma we decided to monitor. She had follow up mm and this showed medial right breast calcs measuring 1.9 cm and another retroareolar calcs measuring 1.2 cm. Biopsy of the 1.9 cm area is LG DCIS that is 100% er pos, 2% pr pos. Biopsy of other calcs is ADH/papilloma. Clips are 2.7 cm apart. she had large hematoma and returns today. This is better.  Medical History: No past medical history on file.  Patient Active Problem List  Diagnosis  Ductal carcinoma in situ (DCIS) of right breast  Family history of malignant neoplasm of digestive organs  H/O pheochromocytoma  Hypothyroidism  Insomnia  Osteopenia  Situational anxiety  Dyslipidemia  Itching  Neck pain   Past Surgical History:  Procedure Laterality Date  HYSTERECTOMY VAGINAL N/A 1981    No Known Allergies  Current Outpatient Medications on File Prior to Visit  Medication Sig Dispense Refill  levothyroxine (SYNTHROID) 75 MCG tablet Take 75 mcg by mouth once daily   No current facility-administered medications on file prior to visit.   Family History  Problem Relation Age of Onset  Skin cancer Son    Social History   Tobacco Use  Smoking Status Former  Types: Cigarettes  Quit date: 1986  Years since quitting: 37.8  Smokeless Tobacco Never    Social History   Socioeconomic History  Marital status: Married  Tobacco Use  Smoking status: Former  Types: Cigarettes  Quit date: 1986  Years since quitting: 37.8  Smokeless tobacco: Never  Vaping Use  Vaping Use: Never used  Substance and Sexual Activity  Alcohol use: Yes  Comment: 3x a week  Drug use: Never  Sexual activity: Defer   Objective:   Vitals:  04/10/22 1428  BP: (!) 140/60  Pulse: 85  Temp: 36.1 C (97 F)  SpO2: 96%  Weight: 71.2 kg (157 lb)  Height: 157.5 cm ('5\' 2"'$ )   Body mass index is 28.72 kg/m.  Physical Exam   Hematoma better but still present No breast  mass noted No ax adenopathy Cv regular  Pulm effort normal   Assessment and Plan:    Seed bracketed lumpectomy She still has a pretty significant hematoma on this is more organized. I think waiting a couple more weeks would be beneficial and I am going to move her surgery.

## 2022-04-30 NOTE — Anesthesia Procedure Notes (Signed)
Procedure Name: LMA Insertion Date/Time: 04/30/2022 9:27 AM  Performed by: Bufford Spikes, CRNAPre-anesthesia Checklist: Patient identified, Emergency Drugs available, Suction available and Patient being monitored Patient Re-evaluated:Patient Re-evaluated prior to induction Oxygen Delivery Method: Circle system utilized Preoxygenation: Pre-oxygenation with 100% oxygen Induction Type: IV induction Ventilation: Mask ventilation without difficulty LMA: LMA inserted LMA Size: 4.0 Number of attempts: 1 Airway Equipment and Method: Bite block Placement Confirmation: positive ETCO2 Tube secured with: Tape Dental Injury: Teeth and Oropharynx as per pre-operative assessment

## 2022-04-30 NOTE — Discharge Instructions (Addendum)
[facility] Office Phone Number [phone]  POST OP INSTRUCTIONS Take 400 mg of ibuprofen every 8 hours or 650 mg tylenol every 6 hours for next 72 hours then as needed. Use ice several times daily also.    A prescription for pain medication may be given to you upon discharge.  Take your pain medication as prescribed, if needed.  If narcotic pain medicine is not needed, then you may take acetaminophen (Tylenol), naprosyn (Alleve) or ibuprofen (Advil) as needed. Take your usually prescribed medications unless otherwise directed If you need a refill on your pain medication, please contact your pharmacy.  They will contact our office to request authorization.  Prescriptions will not be filled after 5pm or on week-ends. You should eat very light the first 24 hours after surgery, such as soup, crackers, pudding, etc.  Resume your normal diet the day after surgery. Most patients will experience some swelling and bruising in the breast.  Ice packs and a good support bra will help.  Wear the breast binder provided or a sports bra for 72 hours day and night.  After that wear a sports bra during the day until you return to the office. Swelling and bruising can take several days to resolve.  It is common to experience some constipation if taking pain medication after surgery.  Increasing fluid intake and taking a stool softener will usually help or prevent this problem from occurring.  A mild laxative (Milk of Magnesia or Miralax) should be taken according to package directions if there are no bowel movements after 48 hours. I used skin glue on the incision, you may shower in 24 hours.  The glue will flake off over the next 2-3 weeks.  Any sutures or staples will be removed at the office during your follow-up visit. ACTIVITIES:  You may resume regular daily activities (gradually increasing) beginning the next day.  Wearing a good support bra or sports bra minimizes pain and swelling.  You may  have sexual intercourse when it is comfortable. You may drive when you no longer are taking prescription pain medication, you can comfortably wear a seatbelt, and you can safely maneuver your car and apply brakes. RETURN TO WORK:  ______________________________________________________________________________________ Dennis Bast should see your doctor in the office for a follow-up appointment approximately two weeks after your surgery.  Your doctor's nurse will typically make your follow-up appointment when she calls you with your pathology report.  Expect your pathology report 3-4 business days after your surgery.  You may call to check if you do not hear from [place] after three days. OTHER INSTRUCTIONS: _______________________________________________________________________________________________ _____________________________________________________________________________________________________________________________________ _____________________________________________________________________________________________________________________________________ _____________________________________________________________________________________________________________________________________  WHEN TO CALL DR WAKEFIELD: Fever over 101.0 Nausea and/or vomiting. Extreme swelling or bruising. Continued bleeding from incision. Increased pain, redness, or drainage from the incision.  The clinic staff is available to answer your questions during regular business hours.  Please don't hesitate to call and ask to speak to one of the nurses for clinical concerns.  If you have a medical emergency, go to the nearest emergency room or call 911.  A surgeon from [facility] is always on call at the hospital.  For further questions, please visit [email or web address]   Post Anesthesia Home Care Instructions  Activity: Get plenty of rest for the remainder of the day. A responsible individual must  stay with you for 24 hours following the procedure.  For the next 24 hours, DO NOT: -Drive a car -Paediatric nurse -Drink alcoholic beverages -Take any medication unless  instructed by your physician -Make any legal decisions or sign important papers.  Meals: Start with liquid foods such as gelatin or soup. Progress to regular foods as tolerated. Avoid greasy, spicy, heavy foods. If nausea and/or vomiting occur, drink only clear liquids until the nausea and/or vomiting subsides. Call your physician if vomiting continues.  Special Instructions/Symptoms: Your throat may feel dry or sore from the anesthesia or the breathing tube placed in your throat during surgery. If this causes discomfort, gargle with warm salt water. The discomfort should disappear within 24 hours.  If you had a scopolamine patch placed behind your ear for the management of post- operative nausea and/or vomiting:  1. The medication in the patch is effective for 72 hours, after which it should be removed.  Wrap patch in a tissue and discard in the trash. Wash hands thoroughly with soap and water. 2. You may remove the patch earlier than 72 hours if you experience unpleasant side effects which may include dry mouth, dizziness or visual disturbances. 3. Avoid touching the patch. Wash your hands with soap and water after contact with the patch.    No tylenol until after 1:30pm if needed today.

## 2022-05-01 ENCOUNTER — Encounter (HOSPITAL_BASED_OUTPATIENT_CLINIC_OR_DEPARTMENT_OTHER): Payer: Self-pay | Admitting: General Surgery

## 2022-05-01 NOTE — Anesthesia Postprocedure Evaluation (Signed)
Anesthesia Post Note  Patient: Julie Pacheco  Procedure(s) Performed: RIGHT BREAST BRACKETED LUMPECTOMY WITH RADIOACTIVE SEED LOCALIZATION (Right: Breast)     Patient location during evaluation: PACU Anesthesia Type: General Level of consciousness: awake and alert Pain management: pain level controlled Vital Signs Assessment: post-procedure vital signs reviewed and stable Respiratory status: spontaneous breathing, nonlabored ventilation and respiratory function stable Cardiovascular status: stable and blood pressure returned to baseline Anesthetic complications: no   No notable events documented.  Last Vitals:  Vitals:   04/30/22 1045 04/30/22 1155  BP: (!) 131/53 (!) 150/68  Pulse: (!) 45 (!) 50  Resp: (!) 9 20  Temp:  36.6 C  SpO2: 99% 95%    Last Pain:  Vitals:   04/30/22 1155  TempSrc: Oral  PainSc: [place]

## 2022-05-02 ENCOUNTER — Other Ambulatory Visit: Payer: Self-pay | Admitting: Emergency Medicine

## 2022-05-02 DIAGNOSIS — E039 Hypothyroidism, unspecified: Secondary | ICD-10-CM

## 2022-05-04 ENCOUNTER — Ambulatory Visit: Payer: Medicare Other | Admitting: Hematology

## 2022-05-04 LAB — SURGICAL PATHOLOGY

## 2022-05-08 ENCOUNTER — Telehealth: Payer: Self-pay | Admitting: Radiation Oncology

## 2022-05-08 ENCOUNTER — Encounter: Payer: Self-pay | Admitting: *Deleted

## 2022-05-08 NOTE — Telephone Encounter (Signed)
I called and spoke with both the patient and her husband and shared Dr. Ida Rogue recommendation that if she is taking antiestrogen therapy, she can forgo radiotherapy. She has an upcoming appt she'd like to move to see Dr. Burr Medico, but is in agreement she would like to avoid radiation.

## 2022-05-13 NOTE — Assessment & Plan Note (Addendum)
-  Diagnosed in Sep 2023. -Status post lumpectomy on 04/30/2022.  I reviewed her surgical pathology findings.  Margins were negative. 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  I discussed risk of breast cancer in the future. -I discussed the benefit of adjuvant radiation and or antiestrogen therapy to prevent future breast cancer.

## 2022-05-14 ENCOUNTER — Encounter: Payer: Self-pay | Admitting: *Deleted

## 2022-05-14 ENCOUNTER — Inpatient Hospital Stay: Payer: Medicare Other | Attending: Hematology | Admitting: Hematology

## 2022-05-14 ENCOUNTER — Encounter: Payer: Self-pay | Admitting: Hematology

## 2022-05-14 ENCOUNTER — Other Ambulatory Visit: Payer: Self-pay

## 2022-05-14 VITALS — BP 156/73 | HR 58 | Temp 98.2°F | Resp 18 | Ht 67.0 in | Wt 155.8 lb

## 2022-05-14 DIAGNOSIS — D0511 Intraductal carcinoma in situ of right breast: Secondary | ICD-10-CM

## 2022-05-14 DIAGNOSIS — Z803 Family history of malignant neoplasm of breast: Secondary | ICD-10-CM | POA: Insufficient documentation

## 2022-05-14 DIAGNOSIS — Z800 Family history of malignant neoplasm of digestive organs: Secondary | ICD-10-CM | POA: Insufficient documentation

## 2022-05-14 DIAGNOSIS — Z90710 Acquired absence of both cervix and uterus: Secondary | ICD-10-CM | POA: Insufficient documentation

## 2022-05-14 NOTE — Progress Notes (Signed)
[facility]   [phone] [phone]   Clinic Follow up Note   Patient Care Team: Horald Pollen, MD as PCP - General (Internal Medicine) Lavonna Monarch, MD (Inactive) as Consulting Physician (Dermatology) Okey Regal, [place] as Consulting Physician (Optometry) Mauro Kaufmann, RN as Oncology Nurse Navigator Rockwell Germany, RN as Oncology Nurse Navigator Rolm Bookbinder, MD as Consulting Physician (General Surgery) Truitt Merle, MD as Consulting Physician (Hematology) Kyung Rudd, MD as Consulting Physician (Radiation Oncology)  Date of Service:  05/14/2022  CHIEF COMPLAINT: f/u of Right Breast DCIS, ER+   CURRENT THERAPY:  Surveillance  ASSESSMENT:  Julie Pacheco is a 76 y.o. 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 -Status post lumpectomy on 04/30/2022.  I reviewed her surgical pathology findings.  Margins were negative. 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  I discussed risk of breast cancer in the future. -I discussed the benefit of adjuvant radiation and or antiestrogen therapy to prevent future breast cancer.  Patient declined both radiation and antiestrogen therapy due to advanced age and cancer of side effects.  I think this is a reasonable decision. -She will continue screening mammogram every year.    PLAN: -Patient declined adjuvant radiation or antiestrogen therapy -She will continue annual screening mammogram -Follow-up with PCP for cancer screening.  I will see her back as needed  SUMMARY OF ONCOLOGIC HISTORY: Oncology History Overview Note   Cancer Staging  Ductal carcinoma in situ (DCIS) of right breast Staging form: Breast, AJCC 8th Edition - Clinical stage from 03/14/2022: Stage 0 (cTis (DCIS), cN0, cM0, G1, ER+, PR+, HER2: Not Assessed) - Signed by Truitt Merle, MD on 03/20/2022    Ductal carcinoma in situ (DCIS) of right breast  02/19/2022 Mammogram   CLINICAL DATA:  76 year old  female presenting for 1 year follow-up of a nipple papilloma in the right breast. Given location ultrasound biopsy was not performed. Patient had surgical consultation and declined surgery.   EXAM: DIGITAL DIAGNOSTIC BILATERAL MAMMOGRAM WITH TOMOSYNTHESIS; ULTRASOUND RIGHT BREAST LIMITED  IMPRESSION: 1. New suspicious calcifications in the retroareolar and medial aspects of the right breast.   2.  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   3.  No mammographic evidence of malignancy in the left breast.   03/14/2022 Cancer Staging   Staging form: Breast, AJCC 8th Edition - Clinical stage from 03/14/2022: Stage 0 (cTis (DCIS), cN0, cM0, G1, ER+, PR+, HER2: Not Assessed) - Signed by Truitt Merle, MD on 03/20/2022 Stage prefix: Initial diagnosis Histologic grading system: 3 grade system   03/14/2022 Initial Biopsy   Diagnosis 1. Breast, right, needle core biopsy, lower inner quadrant, ribbon clip - DUCTAL CARCINOMA IN SITU, LOW GRADE - NECROSIS: NOT IDENTIFIED - CALCIFICATIONS: PRESENT - DCIS LENGTH: AT LEAST 0.3 CM 2. 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 PROGNOSTIC INDICATORS Results: Estrogen Receptor: 100%, POSITIVE, STRONG STAINING INTENSITY Progesterone Receptor: 2%, POSITIVE, STRONG STAINING INTENSITY   03/19/2022 Initial Diagnosis   Ductal carcinoma in situ (DCIS) of right breast   04/12/2022 Genetic Testing   Negative genetic testing: no pathogenic variants detected in [facility] CustomNext-Cancer +RNAinsight Panel.  Report date is 04/12/2022.  The CustomNext-Cancer +RNAinsight Panel offered by [facility] and includes sequencing and rearrangement analysis for the following 91 genes: AIP, ALK, APC, ATM, AXIN2, BAP1, BARD1, BLM, BMPR1A, BRCA1,  BRCA2, BRIP1, CDC73, CDH1, CDK4, CDKN1B, CDKN2A, CHEK2, CTNNA1, DICER1,  FANCC, FH, FLCN, GALNT12, KIF1B, LZTR1, MAX, MEN1, MET, MLH1, MRE11A, MSH2, MSH3, MSH6, MUTYH, NBN, NF1, NF2, NTHL1, PALB2, PHOX2B, PMS2, POT1, PRKAR1A, PTCH1, PTEN, RAD50, RAD51C, RAD51D, RB1, RECQL, RET, SDHA, SDHAF2, SDHB, SDHC, SDHD, SMAD4, SMARCA4, SMARCB1, SMARCE1, STK11, SUFU, TMEM127, TP53, TSC1, TSC2, VHL and XRCC2 (sequencing and deletion/duplication); CASR, CFTR, CPA1, CTRC, EGFR, EGLN1, FAM175A, HOXB13, KIT, MITF, MLH3, PALLD, PDGFRA, POLD1, POLE, PRSS1, RINT1, RPS20, SPINK1 and TERT (sequencing only); EPCAM and GREM1 (deletion/duplication only). RNA data is routinely analyzed for use in variant interpretation for all genes.   04/30/2022 Cancer Staging   Staging form: Breast, AJCC 8th Edition - Pathologic stage from 04/30/2022: Stage Unknown (pTis (DCIS), pNX, cM0, G1, ER+, PR+, HER2: Not Assessed) - Signed by Truitt Merle, MD on 05/13/2022 Stage prefix: Initial diagnosis Histologic grading system: 3 grade system Residual tumor (R): R0 - None      INTERVAL HISTORY:  Julie Pacheco is here for a follow up of Right Breast DCIS, ER+  She was last seen by me on 03/21/22 She presents to the clinic alone.Pt said surgery went well. Pt denies having pain.   All other systems were reviewed with the patient and are negative.  MEDICAL HISTORY:  Past Medical History:  Diagnosis Date   Atypical mole 02/05/2011   mild-Right post knee   Breast cancer [facility])    Family history of breast cancer 03/22/2022   Family history of pancreatic cancer 03/22/2022   Migraines    Thyroid disease     SURGICAL HISTORY: Past Surgical History:  Procedure Laterality Date   ABDOMINAL HYSTERECTOMY     BREAST BIOPSY  04/26/2022   MM RT RADIOACTIVE SEED LOC MAMMO GUIDE 04/26/2022 [facility] MAMMOGRAP[facility]   BREAST BIOPSY  04/26/2022   MM RT RADIOACTIVE SEED EA ADD LESION LOC MAMMO GUIDE 04/26/2022 [facility] MAMMO[facility]   BREAST LUMPECTOMY WITH RADIOACTIVE SEED LOCALIZATION Right 04/30/2022   Procedure: RIGHT BREAST  BRACKETED LUMPECTOMY WITH RADIOACTIVE SEED LOCALIZATION;  Surgeon: Rolm Bookbinder, MD;  Location: [place];  Service: General;  Laterality: Right;    I have reviewed the social history and family history with the patient and they are unchanged from previous note.  ALLERGIES:  has No Known Allergies.  MEDICATIONS:  Current Outpatient Medications  Medication Sig Dispense Refill   diazepam (VALIUM) 5 MG tablet Take 1 tablet (5 mg total) by mouth every 12 (twelve) hours as needed for anxiety. 20 tablet 0   levothyroxine (SYNTHROID) 75 MCG tablet TAKE 1 TABLET BY MOUTH ONCE DAILY BEFORE BREAKFAST 90 tablet 3   traMADol (ULTRAM) 50 MG tablet Take 1 tablet (50 mg total) by mouth every 6 (six) hours as needed. 6 tablet 0   No current facility-administered medications for this visit.    PHYSICAL EXAMINATION: ECOG PERFORMANCE STATUS: 0 - Asymptomatic  Vitals:   05/14/22 1434  BP: (!) 156/73  Pulse: (!) 58  Resp: 18  Temp: 98.2 F (36.8 C)  SpO2: 100%   Wt Readings from Last 3 Encounters:  05/14/22 155 lb 12.8 oz (70.7 kg)  04/30/22 154 lb 12.2 oz (70.2 kg)  03/21/22 155 lb 8 oz (70.5 kg)    GENERAL:alert, no distress and comfortable SKIN: skin color, texture, turgor are normal, no rashes or significant lesions EYES: normal, Conjunctiva are pink and non-injected, sclera clear NECK: supple, thyroid normal size, non-tender, without nodularity LYMPH:  no palpable lymphadenopathy in the cervical, axillary  LUNGS: clear to  auscultation and percussion with normal breathing effort HEART: regular rate & rhythm and no murmurs and no lower extremity edema ABDOMEN:abdomen soft, non-tender and normal bowel sounds Musculoskeletal:no cyanosis of digits and no clubbing  NEURO: alert & oriented x 3 with fluent speech, no focal motor/sensory deficits BREAST: right side incision with hard soft tissue, Left side no palpable mass. Breast exam benign.    LABORATORY DATA:  I have  reviewed the data as listed    Latest Ref Rng & Units 03/21/2022   12:58 PM 03/08/2021    8:11 PM 09/12/2016    8:52 AM  CBC  WBC 4.0 - 10.5 K/uL 6.3  9.0  6.2   Hemoglobin 12.0 - 15.0 g/dL 13.9  14.1  14.4   Hematocrit 36.0 - 46.0 % 41.6  43.6  42.2   Platelets 150 - 400 K/uL 223  155  216         Latest Ref Rng & Units 03/21/2022   12:58 PM 08/28/2021    1:32 PM 03/08/2021    8:11 PM  CMP  Glucose 70 - 99 mg/dL 90  90  88   BUN 8 - 23 mg/dL _0 Creatinine 0.44 - 1.00 mg/dL 0.81  0.75  0.64   Sodium 135 - 145 mmol/L 141  142  139   Potassium 3.5 - 5.1 mmol/L 4.8  4.5  3.9   Chloride 98 - 111 mmol/L 107  108  107   CO2 22 - 32 mmol/L _1 Calcium 8.9 - 10.3 mg/dL 9.0  9.0  8.7   Total Protein 6.5 - 8.1 g/dL 7.3  6.9    Total Bilirubin 0.3 - 1.2 mg/dL 0.5  0.6    Alkaline Phos 38 - 126 U/L 66  59    AST 15 - 41 U/L 21  29    ALT 0 - 44 U/L 20  28        RADIOGRAPHIC STUDIES: I have personally reviewed the radiological images as listed and agreed with the findings in the report. No results found.    No orders of the defined types were placed in this encounter.  All questions were answered. The patient knows to call the clinic with any problems, questions or concerns. No barriers to learning was detected. The total time spent in the appointment was 20 minutes.     Truitt Merle, MD 05/14/2022   Felicity Coyer, CMA, am acting as scribe for Truitt Merle, MD.   I have reviewed the above documentation for accuracy and completeness, and I agree with the above.

## 2022-05-29 ENCOUNTER — Other Ambulatory Visit: Payer: Self-pay | Admitting: Hematology

## 2022-05-29 MED ORDER — TAMOXIFEN CITRATE 10 MG PO TABS: 5.0000 mg | ORAL_TABLET | Freq: Every day | ORAL | 1 refills | Status: DC

## 2022-05-31 ENCOUNTER — Telehealth: Payer: Self-pay | Admitting: *Deleted

## 2022-05-31 NOTE — Telephone Encounter (Signed)
Called pt and discussed Tamoxifen as prescribed by Dr. Burr Medico. Discussed how our bodies create small amounts of estrogen even without ovaries. Pt was very confused on to why she needed anti-estrogen oral therapy prior to this discuss. Informed pt will have f/u appt with Dr. Burr Medico in 3 mon to assess tolerance. Confirmed prescription location. No further needs voiced.

## 2022-06-14 ENCOUNTER — Encounter (HOSPITAL_COMMUNITY): Payer: Self-pay

## 2022-08-27 ENCOUNTER — Ambulatory Visit (INDEPENDENT_AMBULATORY_CARE_PROVIDER_SITE_OTHER): Payer: Medicare Other

## 2022-08-27 VITALS — Ht 67.0 in | Wt 153.0 lb

## 2022-08-27 DIAGNOSIS — Z0000 Encounter for general adult medical examination without abnormal findings: Secondary | ICD-10-CM | POA: Diagnosis not present

## 2022-08-27 NOTE — Patient Instructions (Addendum)
Julie Pacheco , Thank you for taking time to come for your Medicare Wellness Visit. I appreciate your ongoing commitment to your health goals. Please review the following plan we discussed and let me know if I can assist you in the future.   These are the goals we discussed:  Goals      Client understands the importance of follow-up with providers by attending scheduled visits        This is a list of the screening recommended for you and due dates:  Health Maintenance  Topic Date Due   DTaP/Tdap/Td vaccine (1 - Tdap) Never done   Medicare Annual Wellness Visit  08/27/2023   DEXA scan (bone density measurement)  Completed   Hepatitis C Screening: USPSTF Recommendation to screen - Ages 79-79 yo.  Completed   HPV Vaccine  Aged Out   Pneumonia Vaccine  Discontinued   Flu Shot  Discontinued   Colon Cancer Screening  Discontinued   COVID-19 Vaccine  Discontinued   Zoster (Shingles) Vaccine  Discontinued    Advanced directives: Yes  Conditions/risks identified: Yes  Next appointment: Follow up in one year for your annual wellness visit.   Preventive Care 79 Years and Older, Female Preventive care refers to lifestyle choices and visits with your health care provider that can promote health and wellness. What does preventive care include? A yearly physical exam. This is also called an annual well check. Dental exams once or twice a year. Routine eye exams. Ask your health care provider how often you should have your eyes checked. Personal lifestyle choices, including: Daily care of your teeth and gums. Regular physical activity. Eating a healthy diet. Avoiding tobacco and drug use. Limiting alcohol use. Practicing safe sex. Taking low-dose aspirin every day. Taking vitamin and mineral supplements as recommended by your health care provider. What happens during an annual well check? The services and screenings done by your health care provider during your annual well check will  depend on your age, overall health, lifestyle risk factors, and family history of disease. Counseling  Your health care provider may ask you questions about your: Alcohol use. Tobacco use. Drug use. Emotional well-being. Home and relationship well-being. Sexual activity. Eating habits. History of falls. Memory and ability to understand (cognition). Work and work Statistician. Reproductive health. Screening  You may have the following tests or measurements: Height, weight, and BMI. Blood pressure. Lipid and cholesterol levels. These may be checked every 5 years, or more frequently if you are over 55 years old. Skin check. Lung cancer screening. You may have this screening every year starting at age 79 if you have a 30-pack-year history of smoking and currently smoke or have quit within the past 15 years. Fecal occult blood test (FOBT) of the stool. You may have this test every year starting at age 79. Flexible sigmoidoscopy or colonoscopy. You may have a sigmoidoscopy every 5 years or a colonoscopy every 10 years starting at age 50. Hepatitis C blood test. Hepatitis B blood test. Sexually transmitted disease (STD) testing. Diabetes screening. This is done by checking your blood sugar (glucose) after you have not eaten for a while (fasting). You may have this done every 1-3 years. Bone density scan. This is done to screen for osteoporosis. You may have this done starting at age 79 Mammogram. This may be done every 1-2 years. Talk to your health care provider about how often you should have regular mammograms. Talk with your health care provider about your test results,  treatment options, and if necessary, the need for more tests. Vaccines  Your health care provider may recommend certain vaccines, such as: Influenza vaccine. This is recommended every year. Tetanus, diphtheria, and acellular pertussis (Tdap, Td) vaccine. You may need a Td booster every 10 years. Zoster vaccine. You may  need this after age 79 Pneumococcal 13-valent conjugate (PCV13) vaccine. One dose is recommended after age 79 Pneumococcal polysaccharide (PPSV23) vaccine. One dose is recommended after age 79 Talk to your health care provider about which screenings and vaccines you need and how often you need them. This information is not intended to replace advice given to you by your health care provider. Make sure you discuss any questions you have with your health care provider. Document Released: 06/10/2015 Document Revised: 02/01/2016 Document Reviewed: 03/15/2015 Elsevier Interactive Patient Education  2017 [employer] Prevention in the Home Falls can cause injuries. They can happen to people of all ages. There are many things you can do to make your home safe and to help prevent falls. What can I do on the outside of my home? Regularly fix the edges of walkways and driveways and fix any cracks. Remove anything that might make you trip as you walk through a door, such as a raised step or threshold. Trim any bushes or trees on the path to your home. Use bright outdoor lighting. Clear any walking paths of anything that might make someone trip, such as rocks or tools. Regularly check to see if handrails are loose or broken. Make sure that both sides of any steps have handrails. Any raised decks and porches should have guardrails on the edges. Have any leaves, snow, or ice cleared regularly. Use sand or salt on walking paths during winter. Clean up any spills in your garage right away. This includes oil or grease spills. What can I do in the bathroom? Use night lights. Install grab bars by the toilet and in the tub and shower. Do not use towel bars as grab bars. Use non-skid mats or decals in the tub or shower. If you need to sit down in the shower, use a plastic, non-slip stool. Keep the floor dry. Clean up any water that spills on the floor as soon as it happens. Remove soap buildup in  the tub or shower regularly. Attach bath mats securely with double-sided non-slip rug tape. Do not have throw rugs and other things on the floor that can make you trip. What can I do in the bedroom? Use night lights. Make sure that you have a light by your bed that is easy to reach. Do not use any sheets or blankets that are too big for your bed. They should not hang down onto the floor. Have a firm chair that has side arms. You can use this for support while you get dressed. Do not have throw rugs and other things on the floor that can make you trip. What can I do in the kitchen? Clean up any spills right away. Avoid walking on wet floors. Keep items that you use a lot in easy-to-reach places. If you need to reach something above you, use a strong step stool that has a grab bar. Keep electrical cords out of the way. Do not use floor polish or wax that makes floors slippery. If you must use wax, use non-skid floor wax. Do not have throw rugs and other things on the floor that can make you trip. What can I do with my stairs? Do  not leave any items on the stairs. Make sure that there are handrails on both sides of the stairs and use them. Fix handrails that are broken or loose. Make sure that handrails are as long as the stairways. Check any carpeting to make sure that it is firmly attached to the stairs. Fix any carpet that is loose or worn. Avoid having throw rugs at the top or bottom of the stairs. If you do have throw rugs, attach them to the floor with carpet tape. Make sure that you have a light switch at the top of the stairs and the bottom of the stairs. If you do not have them, ask someone to add them for you. What else can I do to help prevent falls? Wear shoes that: Do not have high heels. Have rubber bottoms. Are comfortable and fit you well. Are closed at the toe. Do not wear sandals. If you use a stepladder: Make sure that it is fully opened. Do not climb a closed  stepladder. Make sure that both sides of the stepladder are locked into place. Ask someone to hold it for you, if possible. Clearly mark and make sure that you can see: Any grab bars or handrails. First and last steps. Where the edge of each step is. Use tools that help you move around (mobility aids) if they are needed. These include: Canes. Walkers. Scooters. Crutches. Turn on the lights when you go into a dark area. Replace any light bulbs as soon as they burn out. Set up your furniture so you have a clear path. Avoid moving your furniture around. If any of your floors are uneven, fix them. If there are any pets around you, be aware of where they are. Review your medicines with your doctor. Some medicines can make you feel dizzy. This can increase your chance of falling. Ask your doctor what other things that you can do to help prevent falls. This information is not intended to replace advice given to you by your health care provider. Make sure you discuss any questions you have with your health care provider. Document Released: 03/10/2009 Document Revised: 10/20/2015 Document Reviewed: 06/18/2014 Elsevier Interactive Patient Education  2017 [employer].

## 2022-08-27 NOTE — Progress Notes (Signed)
I connected with  Julie Pacheco on 08/27/22 by a audio enabled telemedicine application and verified that I am speaking with the correct person using two identifiers.  Patient Location: Home  Provider Location: Office/Clinic  I discussed the limitations of evaluation and management by telemedicine. The patient expressed understanding and agreed to proceed.  Subjective:   Julie Pacheco is a 77 y.o. female who presents for Medicare Annual (Subsequent) preventive examination.  Review of Systems     Cardiac Risk Factors include: advanced age (>52men, >59 women)     Objective:    Today's Vitals   08/27/22 1019  Weight: 153 lb (69.4 kg)  Height: 5\' 7"  (1.702 m)  PainSc: 0-No pain   Body mass index is 23.96 kg/m.     08/27/2022   10:21 AM 04/30/2022    7:25 AM 04/18/2022    2:42 PM 08/25/2021    3:08 PM 09/25/2019    9:45 AM 10/29/2018   10:08 PM 07/23/2018    9:50 AM  Advanced Directives  Does Patient Have a Medical Advance Directive? Yes Yes Yes Yes No No Yes  Type of Paramedic of [place];Living will  [place];Living will [place];Living will   Living will;Healthcare Power of Attorney  Does patient want to make changes to medical advance directive?  No - Patient declined No - Patient declined    Yes (ED - Information included in AVS)  Copy of [place] in Chart? No - copy requested  No - copy requested No - copy requested   No - copy requested  Would patient like information on creating a medical advance directive?      No - Guardian declined     Current Medications (verified) Outpatient Encounter Medications as of 08/27/2022  Medication Sig   levothyroxine (SYNTHROID) 75 MCG tablet TAKE 1 TABLET BY MOUTH ONCE DAILY BEFORE BREAKFAST   diazepam (VALIUM) 5 MG tablet Take 1 tablet (5 mg total) by mouth every 12 (twelve) hours as needed for anxiety.   tamoxifen (NOLVADEX) 10 MG tablet Take 0.5  tablets (5 mg total) by mouth daily.   traMADol (ULTRAM) 50 MG tablet Take 1 tablet (50 mg total) by mouth every 6 (six) hours as needed. (Patient not taking: Reported on 08/27/2022)   No facility-administered encounter medications on file as of 08/27/2022.    Allergies (verified) Patient has no known allergies.   History: Past Medical History:  Diagnosis Date   Atypical mole 02/05/2011   mild-Right post knee   Breast cancer    Family history of breast cancer 03/22/2022   Family history of pancreatic cancer 03/22/2022   Migraines    Thyroid disease    Past Surgical History:  Procedure Laterality Date   ABDOMINAL HYSTERECTOMY     BREAST BIOPSY  04/26/2022   MM RT RADIOACTIVE SEED LOC MAMMO GUIDE 04/26/2022 [facility]   BREAST BIOPSY  04/26/2022   MM RT RADIOACTIVE SEED EA ADD LESION LOC MAMMO GUIDE 04/26/2022 [facility]   BREAST LUMPECTOMY WITH RADIOACTIVE SEED LOCALIZATION Right 04/30/2022   Procedure: RIGHT BREAST BRACKETED LUMPECTOMY WITH RADIOACTIVE SEED LOCALIZATION;  Surgeon: Rolm Bookbinder, MD;  Location: [place];  Service: General;  Laterality: Right;   Family History  Problem Relation Age of Onset   Lung cancer Mother        dx 8s   Breast cancer Maternal Aunt 55   Pancreatic cancer Maternal Aunt 80   Cancer Cousin  colon cancer   Other Other        great granddaughter; brain tumor @ birth; pituitary tumor @ age 4   Melanoma Son 57       shoulder   Social History   Socioeconomic History   Marital status: Married    Spouse name: Not on file   Number of children: 2   Years of education: Not on file   Highest education level: Not on file  Occupational History   Not on file  Tobacco Use   Smoking status: Former    Packs/day: 1.00    Years: 30.00    Additional pack years: 0.00    Total pack years: 30.00    Types: Cigarettes    Quit date: 12/15/1981    Years since quitting: 40.7   Smokeless tobacco: Never   Substance and Sexual Activity   Alcohol use: Yes    Alcohol/week: 8.0 standard drinks of alcohol    Types: 8 Glasses of wine per week   Drug use: Never   Sexual activity: Not on file  Other Topics Concern   Not on file  Social History Narrative   Not on file   Social Determinants of Health   Financial Resource Strain: Low Risk  (08/27/2022)   Overall Financial Resource Strain (CARDIA)    Difficulty of Paying Living Expenses: Not hard at all  Food Insecurity: No Food Insecurity (08/27/2022)   Hunger Vital Sign    Worried About Running Out of Food in the Last Year: Never true    Ran Out of Food in the Last Year: Never true  Transportation Needs: No Transportation Needs (08/27/2022)   PRAPARE - Hydrologist (Medical): No    Lack of Transportation (Non-Medical): No  Physical Activity: Sufficiently Active (08/27/2022)   Exercise Vital Sign    Days of Exercise per Week: 5 days    Minutes of Exercise per Session: 30 min  Stress: No Stress Concern Present (08/27/2022)   [place]    Feeling of Stress : Not at all  Social Connections: Moderately Integrated (08/27/2022)   Social Connection and Isolation Panel [NHANES]    Frequency of Communication with Friends and Family: Twice a week    Frequency of Social Gatherings with Friends and Family: Twice a week    Attends Religious Services: Never    Marine scientist or Organizations: Yes    Attends Music therapist: More than 4 times per year    Marital Status: Married    Tobacco Counseling Counseling given: Not Answered   Clinical Intake:  Pre-visit preparation completed: Yes  Pain : No/denies pain Pain Score: 0-No pain     BMI - recorded: 23.96 Nutritional Status: BMI of 19-24  Normal Nutritional Risks: None Diabetes: No  How often do you need to have someone help you when you read instructions, pamphlets, or other  written materials from your doctor or pharmacy?: 1 - Never What is the last grade level you completed in school?: HSG  Diabetic? No  Interpreter Needed?: No  Information entered by :: Lisette Abu, LPN.   Activities of Daily Living    08/27/2022   10:25 AM 04/30/2022    7:27 AM  In your present state of health, do you have any difficulty performing the following activities:  Hearing? 0 0  Vision? 0 1  Difficulty concentrating or making decisions? 0 0  Walking or climbing stairs?  0 0  Dressing or bathing? 0 0  Doing errands, shopping? 0   Preparing Food and eating ? N   Using the Toilet? N   In the past six months, have you accidently leaked urine? N   Do you have problems with loss of bowel control? N   Managing your Medications? N   Managing your Finances? N   Housekeeping or managing your Housekeeping? N     Patient Care Team: Horald Pollen, MD as PCP - General (Internal Medicine) Lavonna Monarch, MD (Inactive) as Consulting Physician (Dermatology) Okey Regal, [place] as Consulting Physician (Optometry) Mauro Kaufmann, RN as Oncology Nurse Navigator Rockwell Germany, RN as Oncology Nurse Navigator Rolm Bookbinder, MD as Consulting Physician (General Surgery) Truitt Merle, MD as Consulting Physician (Hematology) Kyung Rudd, MD as Consulting Physician (Radiation Oncology)  Indicate any recent Medical Services you may have received from other than [facility] providers in the past year (date may be approximate).     Assessment:   This is a routine wellness examination for [place].  Hearing/Vision screen Hearing Screening - Comments:: Denies hearing difficulties   Vision Screening - Comments:: Wears rx glasses - up to date with routine eye exams with Okey Regal, OD with [facility]   Dietary issues and exercise activities discussed: Current Exercise Habits: Home exercise routine, Type of exercise: walking, Time (Minutes): 30, Frequency (Times/Week): 5, Weekly  Exercise (Minutes/Week): 150, Intensity: Moderate, Exercise limited by: None identified   Goals Addressed             This Visit's Progress    Client understands the importance of follow-up with providers by attending scheduled visits        Depression Screen    08/27/2022   10:27 AM 02/27/2022   10:04 AM 12/19/2021    3:59 PM 08/28/2021    1:09 PM 08/25/2021    3:09 PM 03/21/2021   12:59 PM 02/27/2021    1:08 PM  PHQ 2/9 Scores  PHQ - 2 Score 0 0 0 0 0 0 0  PHQ- 9 Score   1        Fall Risk    08/27/2022   10:23 AM 02/27/2022   10:05 AM 12/19/2021    3:59 PM 08/28/2021    1:09 PM 08/25/2021    3:09 PM  Fall Risk   Falls in the past year? 0 0 0 0 0  Number falls in past yr: 0 0 0  0  Injury with Fall? 0 0 0  0  Risk for fall due to : No Fall Risks No Fall Risks     Follow up Falls prevention discussed Falls evaluation completed   Falls evaluation completed    [place]:  Any stairs in or around the home? Yes  If so, are there any without handrails? No  Home free of loose throw rugs in walkways, pet beds, electrical cords, etc? Yes  Adequate lighting in your home to reduce risk of falls? Yes   ASSISTIVE DEVICES UTILIZED TO PREVENT FALLS:  Life alert? No  Use of a cane, walker or w/c? No  Grab bars in the bathroom? No  Shower chair or bench in shower? No  Elevated toilet seat or a handicapped toilet? No   TIMED UP AND GO:  Was the test performed? No . Telephonic Visit  Cognitive Function:        08/27/2022   10:25 AM 09/25/2019    9:37 AM 07/23/2018  9:55 AM  6CIT Screen  What Year? 0 points 0 points 0 points  What month? 0 points 0 points 0 points  What time? 0 points 0 points 0 points  Count back from 20 0 points 0 points 0 points  Months in reverse 0 points 0 points 0 points  Repeat phrase 0 points 0 points 0 points  Total Score 0 points 0 points 0 points    Immunizations Immunization History  Administered Date(s)  Administered   PFIZER(Purple Top)SARS-COV-2 Vaccination 07/04/2019, 07/29/2019   Zoster Recombinat (Shingrix) 02/27/2021    TDAP status: Due, Education has been provided regarding the importance of this vaccine. Advised may receive this vaccine at local pharmacy or Health Dept. Aware to provide a copy of the vaccination record if obtained from local pharmacy or Health Dept. Verbalized acceptance and understanding.  Flu Vaccine status: Declined, Education has been provided regarding the importance of this vaccine but patient still declined. Advised may receive this vaccine at local pharmacy or Health Dept. Aware to provide a copy of the vaccination record if obtained from local pharmacy or Health Dept. Verbalized acceptance and understanding.  Pneumococcal vaccine status: Declined,  Education has been provided regarding the importance of this vaccine but patient still declined. Advised may receive this vaccine at local pharmacy or Health Dept. Aware to provide a copy of the vaccination record if obtained from local pharmacy or Health Dept. Verbalized acceptance and understanding.   Covid-19 vaccine status: Completed vaccines  Qualifies for Shingles Vaccine? Yes   Zostavax completed No   Shingrix Completed?: Yes  Screening Tests Health Maintenance  Topic Date Due   DTaP/Tdap/Td (1 - Tdap) Never done   Medicare Annual Wellness (AWV)  08/27/2023   DEXA SCAN  Completed   Hepatitis C Screening  Completed   HPV VACCINES  Aged Out   Pneumonia Vaccine 25+ Years old  Discontinued   INFLUENZA VACCINE  Discontinued   COLONOSCOPY (Pts 45-17yrs Insurance coverage will need to be confirmed)  Discontinued   COVID-19 Vaccine  Discontinued   Zoster Vaccines- Shingrix  Discontinued    Health Maintenance  Health Maintenance Due  Topic Date Due   DTaP/Tdap/Td (1 - Tdap) Never done    Colorectal cancer screening: No longer required.   Mammogram status: Completed 03/14/2022. Repeat every  year  Bone Density status: Completed 10/20/2014. Results reflect: Bone density results: OSTEOPENIA. Repeat every 2-3 years.  Lung Cancer Screening: (Low Dose CT Chest recommended if Age 93-80 years, 30 pack-year currently smoking OR have quit w/in 15years.) does not qualify.   Lung Cancer Screening Referral: no  Additional Screening:  Hepatitis C Screening: does qualify; Completed 10/31/2015  Vision Screening: Recommended annual ophthalmology exams for early detection of glaucoma and other disorders of the eye. Is the patient up to date with their annual eye exam?  Yes  Who is the provider or what is the name of the office in which the patient attends annual eye exams? [facility] If pt is not established with a provider, would they like to be referred to a provider to establish care? No .   Dental Screening: Recommended annual dental exams for proper oral hygiene  Community Resource Referral / Chronic Care Management: CRR required this visit?  No   CCM required this visit?  No      Plan:     I have personally reviewed and noted the following in the patient's chart:   Medical and social history Use of alcohol, tobacco or illicit drugs  Current medications and supplements including opioid prescriptions. Patient is not currently taking opioid prescriptions. Functional ability and status Nutritional status Physical activity Advanced directives List of other physicians Hospitalizations, surgeries, and ER visits in previous 12 months Vitals Screenings to include cognitive, depression, and falls Referrals and appointments  In addition, I have reviewed and discussed with patient certain preventive protocols, quality metrics, and best practice recommendations. A written personalized care plan for preventive services as well as general preventive health recommendations were provided to patient.     Sheral Flow, LPN   [id number]   Nurse Notes: Normal cognitive status assessed by  direct observation by this Nurse Health Advisor. No abnormalities found.

## 2022-08-30 ENCOUNTER — Encounter: Payer: Self-pay | Admitting: Hematology

## 2022-08-30 ENCOUNTER — Other Ambulatory Visit: Payer: Self-pay

## 2022-08-30 ENCOUNTER — Inpatient Hospital Stay: Payer: Medicare Other | Attending: Hematology | Admitting: Hematology

## 2022-08-30 VITALS — BP 134/58 | HR 63 | Temp 98.0°F | Resp 16 | Ht 67.0 in | Wt 155.5 lb

## 2022-08-30 DIAGNOSIS — Z90710 Acquired absence of both cervix and uterus: Secondary | ICD-10-CM | POA: Insufficient documentation

## 2022-08-30 DIAGNOSIS — Z86000 Personal history of in-situ neoplasm of breast: Secondary | ICD-10-CM | POA: Insufficient documentation

## 2022-08-30 DIAGNOSIS — Z800 Family history of malignant neoplasm of digestive organs: Secondary | ICD-10-CM | POA: Insufficient documentation

## 2022-08-30 DIAGNOSIS — Z803 Family history of malignant neoplasm of breast: Secondary | ICD-10-CM | POA: Insufficient documentation

## 2022-08-30 DIAGNOSIS — D0511 Intraductal carcinoma in situ of right breast: Secondary | ICD-10-CM

## 2022-08-30 NOTE — Assessment & Plan Note (Signed)
-  Diagnosed in Sep 2023. -Status post lumpectomy on 04/30/2022.  I reviewed her surgical pathology findings.  Margins were negative. 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  I discussed risk of breast cancer in the future. -I discussed the benefit of adjuvant radiation and or antiestrogen therapy to prevent future breast cancer. 

## 2022-08-30 NOTE — Progress Notes (Signed)
[place]   [phone] [phone]   Clinic Follow up Note   Patient Care Team: Horald Pollen, MD as PCP - General (Internal Medicine) Lavonna Monarch, MD (Inactive) as Consulting Physician (Dermatology) Okey Regal, [place] as Consulting Physician (Optometry) Mauro Kaufmann, RN as Oncology Nurse Navigator Rockwell Germany, RN as Oncology Nurse Navigator Rolm Bookbinder, MD as Consulting Physician (General Surgery) Truitt Merle, MD as Consulting Physician (Hematology) Kyung Rudd, MD as Consulting Physician (Radiation Oncology)  Date of Service:  08/30/2022  CHIEF COMPLAINT: f/u of Right Breast DCIS, ER+   CURRENT THERAPY:  Surveillance   ASSESSMENT:  Julie Pacheco is a 77 y.o. 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 -Status post lumpectomy on 04/30/2022.  I reviewed her surgical pathology findings.  Margins were negative. 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  I discussed risk of breast cancer in the future. -I discussed the benefit of adjuvant radiation and or antiestrogen therapy to prevent future breast cancer.  Patient declined both radiation and antiestrogen therapy due to advanced age and cancer of side effects (she did try low dose Tamoxifen for a few days and stopped due to hot flush and fatigue etc).  I think this is a reasonable decision. -She is clinically doing well,  exam was unremarkable.  There is no clinical concern for breast cancer. -She will continue screening mammogram every year. -she prefers to follow-up with her primary care physician for cancer screening.  I will see her as needed.   LAN: -Continue yearly mammogram, next due in Sep 2024  - F/u with PCP  -Advise the pt to call if need in the future.   SUMMARY OF ONCOLOGIC HISTORY: Oncology History Overview Note   Cancer Staging  Ductal carcinoma in situ (DCIS) of right breast Staging form: Breast, AJCC 8th Edition - Clinical  stage from 03/14/2022: Stage 0 (cTis (DCIS), cN0, cM0, G1, ER+, PR+, HER2: Not Assessed) - Signed by Truitt Merle, MD on 03/20/2022    Ductal carcinoma in situ (DCIS) of right breast  02/19/2022 Mammogram   CLINICAL DATA:  77 year old female presenting for 1 year follow-up of a nipple papilloma in the right breast. Given location ultrasound biopsy was not performed. Patient had surgical consultation and declined surgery.   EXAM: DIGITAL DIAGNOSTIC BILATERAL MAMMOGRAM WITH TOMOSYNTHESIS; ULTRASOUND RIGHT BREAST LIMITED  IMPRESSION: 1. New suspicious calcifications in the retroareolar and medial aspects of the right breast.   2.  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   3.  No mammographic evidence of malignancy in the left breast.   03/14/2022 Cancer Staging   Staging form: Breast, AJCC 8th Edition - Clinical stage from 03/14/2022: Stage 0 (cTis (DCIS), cN0, cM0, G1, ER+, PR+, HER2: Not Assessed) - Signed by Truitt Merle, MD on 03/20/2022 Stage prefix: Initial diagnosis Histologic grading system: 3 grade system   03/14/2022 Initial Biopsy   Diagnosis 1. Breast, right, needle core biopsy, lower inner quadrant, ribbon clip - DUCTAL CARCINOMA IN SITU, LOW GRADE - NECROSIS: NOT IDENTIFIED - CALCIFICATIONS: PRESENT - DCIS LENGTH: AT LEAST 0.3 CM 2. 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 PROGNOSTIC INDICATORS Results: Estrogen Receptor: 100%, POSITIVE, STRONG STAINING INTENSITY Progesterone Receptor: 2%, POSITIVE, STRONG STAINING INTENSITY   03/19/2022 Initial Diagnosis   Ductal carcinoma in situ (DCIS) of right breast  04/12/2022 Genetic Testing   Negative genetic testing: no pathogenic variants detected in [facility] CustomNext-Cancer +RNAinsight Panel.  Report date is 04/12/2022.  The CustomNext-Cancer +RNAinsight Panel  offered by [facility] and includes sequencing and rearrangement analysis for the following 91 genes: AIP, ALK, APC, ATM, AXIN2, BAP1, BARD1, BLM, BMPR1A, BRCA1, BRCA2, BRIP1, CDC73, CDH1, CDK4, CDKN1B, CDKN2A, CHEK2, CTNNA1, DICER1, FANCC, FH, FLCN, GALNT12, KIF1B, LZTR1, MAX, MEN1, MET, MLH1, MRE11A, MSH2, MSH3, MSH6, MUTYH, NBN, NF1, NF2, NTHL1, PALB2, PHOX2B, PMS2, POT1, PRKAR1A, PTCH1, PTEN, RAD50, RAD51C, RAD51D, RB1, RECQL, RET, SDHA, SDHAF2, SDHB, SDHC, SDHD, SMAD4, SMARCA4, SMARCB1, SMARCE1, STK11, SUFU, TMEM127, TP53, TSC1, TSC2, VHL and XRCC2 (sequencing and deletion/duplication); CASR, CFTR, CPA1, CTRC, EGFR, EGLN1, FAM175A, HOXB13, KIT, MITF, MLH3, PALLD, PDGFRA, POLD1, POLE, PRSS1, RINT1, RPS20, SPINK1 and TERT (sequencing only); EPCAM and GREM1 (deletion/duplication only). RNA data is routinely analyzed for use in variant interpretation for all genes.   04/30/2022 Cancer Staging   Staging form: Breast, AJCC 8th Edition - Pathologic stage from 04/30/2022: Stage Unknown (pTis (DCIS), pNX, cM0, G1, ER+, PR+, HER2: Not Assessed) - Signed by Truitt Merle, MD on 05/13/2022 Stage prefix: Initial diagnosis Histologic grading system: 3 grade system Residual tumor (R): R0 - None      INTERVAL HISTORY:  Aniesa Buday is here for a follow up of Right Breast DCIS, ER+  She was last seen by me on 05/14/2022 She presents to the clinic alone. Pt reports she did not take the the Tamoxifen, because of the side effects. Pt state that she had hot flashes and she didn't feel like herself.     All other systems were reviewed with the patient and are negative.  MEDICAL HISTORY:  Past Medical History:  Diagnosis Date   Atypical mole 02/05/2011   mild-Right post knee   Breast cancer    Family history of breast cancer 03/22/2022   Family history of pancreatic cancer 03/22/2022   Migraines    Thyroid disease     SURGICAL HISTORY: Past Surgical History:  Procedure Laterality Date   ABDOMINAL  HYSTERECTOMY     BREAST BIOPSY  04/26/2022   MM RT RADIOACTIVE SEED LOC MAMMO GUIDE 04/26/2022 [facility] MAMMO[facility]   BREAST BIOPSY  04/26/2022   MM RT RADIOACTIVE SEED EA ADD LESION LOC MAMMO GUIDE 04/26/2022 [facility]AMMO[facility]   BREAST LUMPECTOMY WITH RADIOACTIVE SEED LOCALIZATION Right 04/30/2022   Procedure: RIGHT BREAST BRACKETED LUMPECTOMY WITH RADIOACTIVE SEED LOCALIZATION;  Surgeon: Rolm Bookbinder, MD;  Location: [facility];  Service: General;  Laterality: Right;    I have reviewed the social history and family history with the patient and they are unchanged from previous note.  ALLERGIES:  has No Known Allergies.  MEDICATIONS:  Current Outpatient Medications  Medication Sig Dispense Refill   diazepam (VALIUM) 5 MG tablet Take 1 tablet (5 mg total) by mouth every 12 (twelve) hours as needed for anxiety. 20 tablet 0   levothyroxine (SYNTHROID) 75 MCG tablet TAKE 1 TABLET BY MOUTH ONCE DAILY BEFORE BREAKFAST 90 tablet 3   No current facility-administered medications for this visit.    PHYSICAL EXAMINATION: ECOG PERFORMANCE STATUS: 0 - Asymptomatic  Vitals:   08/30/22 1408  BP: (!) 134/58  Pulse: 63  Resp: 16  Temp: 98 F (36.7 C)  SpO2: 97%   Wt Readings from Last 3 Encounters:  08/30/22 155 lb 8 oz (70.5 kg)  08/27/22 153 lb (69.4 kg)  05/14/22 155 lb 12.8 oz (70.7 kg)    LYMPH:  (-)  no palpable lymphadenopathy in the cervical, axillary  LUNGS: clear to auscultation and percussion with normal breathing effort ABDOMEN:(-) abdomen soft, (-) non-tender and normal bowel sounds BREAST: s/p right breast Lumpectomy , no palpable mass, breast exam benign. Lt Breast no palpable mss breast exam benign.  LABORATORY DATA:  I have reviewed the data as listed    Latest Ref Rng & Units 03/21/2022   12:58 PM 03/08/2021    8:11 PM 09/12/2016    8:52 AM  CBC  WBC 4.0 - 10.5 K/uL 6.3  9.0  6.2   Hemoglobin 12.0 - 15.0 g/dL 13.9  14.1  14.4   Hematocrit 36.0 -  46.0 % 41.6  43.6  42.2   Platelets 150 - 400 K/uL 223  155  216         Latest Ref Rng & Units 03/21/2022   12:58 PM 08/28/2021    1:32 PM 03/08/2021    8:11 PM  CMP  Glucose 70 - 99 mg/dL 90  90  88   BUN 8 - 23 mg/dL 8  10  9    Creatinine 0.44 - 1.00 mg/dL 0.81  0.75  0.64   Sodium 135 - 145 mmol/L 141  142  139   Potassium 3.5 - 5.1 mmol/L 4.8  4.5  3.9   Chloride 98 - 111 mmol/L 107  108  107   CO2 22 - 32 mmol/L 29  28  26    Calcium 8.9 - 10.3 mg/dL 9.0  9.0  8.7   Total Protein 6.5 - 8.1 g/dL 7.3  6.9    Total Bilirubin 0.3 - 1.2 mg/dL 0.5  0.6    Alkaline Phos 38 - 126 U/L 66  59    AST 15 - 41 U/L 21  29    ALT 0 - 44 U/L 20  28        RADIOGRAPHIC STUDIES: I have personally reviewed the radiological images as listed and agreed with the findings in the report. No results found.    No orders of the defined types were placed in this encounter.  All questions were answered. The patient knows to call the clinic with any problems, questions or concerns. No barriers to learning was detected. The total time spent in the appointment was 20 minutes.     Truitt Merle, MD 08/30/2022   Felicity Coyer, CMA, am acting as scribe for Truitt Merle, MD.   I have reviewed the above documentation for accuracy and completeness, and I agree with the above.

## 2022-09-10 ENCOUNTER — Encounter: Payer: Self-pay | Admitting: Emergency Medicine

## 2022-09-10 ENCOUNTER — Other Ambulatory Visit: Payer: Self-pay | Admitting: Emergency Medicine

## 2022-09-10 ENCOUNTER — Ambulatory Visit (INDEPENDENT_AMBULATORY_CARE_PROVIDER_SITE_OTHER): Payer: Medicare Other | Admitting: Emergency Medicine

## 2022-09-10 VITALS — BP 136/82 | HR 60 | Temp 97.8°F | Ht 67.0 in | Wt 155.0 lb

## 2022-09-10 DIAGNOSIS — E039 Hypothyroidism, unspecified: Secondary | ICD-10-CM

## 2022-09-10 DIAGNOSIS — D0511 Intraductal carcinoma in situ of right breast: Secondary | ICD-10-CM

## 2022-09-10 DIAGNOSIS — N6310 Unspecified lump in the right breast, unspecified quadrant: Secondary | ICD-10-CM | POA: Diagnosis not present

## 2022-09-10 DIAGNOSIS — R030 Elevated blood-pressure reading, without diagnosis of hypertension: Secondary | ICD-10-CM

## 2022-09-10 DIAGNOSIS — E559 Vitamin D deficiency, unspecified: Secondary | ICD-10-CM

## 2022-09-10 DIAGNOSIS — E785 Hyperlipidemia, unspecified: Secondary | ICD-10-CM

## 2022-09-10 LAB — CBC WITH DIFFERENTIAL/PLATELET
Basophils Absolute: 0.1 10*3/uL (ref 0.0–0.1)
Basophils Relative: 0.7 % (ref 0.0–3.0)
Eosinophils Absolute: 0.1 10*3/uL (ref 0.0–0.7)
Eosinophils Relative: 1 % (ref 0.0–5.0)
HCT: 39.9 % (ref 36.0–46.0)
Hemoglobin: 13.3 g/dL (ref 12.0–15.0)
Lymphocytes Relative: 30.4 % (ref 12.0–46.0)
Lymphs Abs: 2.5 10*3/uL (ref 0.7–4.0)
MCHC: 33.4 g/dL (ref 30.0–36.0)
MCV: 92.5 fl (ref 78.0–100.0)
Monocytes Absolute: 0.6 10*3/uL (ref 0.1–1.0)
Monocytes Relative: 6.9 % (ref 3.0–12.0)
Neutro Abs: 5.1 10*3/uL (ref 1.4–7.7)
Neutrophils Relative %: 61 % (ref 43.0–77.0)
Platelets: 208 10*3/uL (ref 150.0–400.0)
RBC: 4.31 Mil/uL (ref 3.87–5.11)
RDW: 12.9 % (ref 11.5–15.5)
WBC: 8.3 10*3/uL (ref 4.0–10.5)

## 2022-09-10 LAB — COMPREHENSIVE METABOLIC PANEL
ALT: 20 U/L (ref 0–35)
AST: 21 U/L (ref 0–37)
Albumin: 3.9 g/dL (ref 3.5–5.2)
Alkaline Phosphatase: 60 U/L (ref 39–117)
BUN: 15 mg/dL (ref 6–23)
CO2: 27 mEq/L (ref 19–32)
Calcium: 9.1 mg/dL (ref 8.4–10.5)
Chloride: 107 mEq/L (ref 96–112)
Creatinine, Ser: 0.76 mg/dL (ref 0.40–1.20)
GFR: 74.95 mL/min (ref >60.00)
Glucose, Bld: 92 mg/dL (ref 70–99)
Potassium: 4.5 mEq/L (ref 3.5–5.1)
Sodium: 141 mEq/L (ref 135–145)
Total Bilirubin: 0.5 mg/dL (ref 0.2–1.2)
Total Protein: 7 g/dL (ref 6.0–8.3)

## 2022-09-10 LAB — VITAMIN D 25 HYDROXY (VIT D DEFICIENCY, FRACTURES): VITD: 14.69 ng/mL — ABNORMAL LOW (ref 30.00–100.00)

## 2022-09-10 LAB — LIPID PANEL
Cholesterol: 185 mg/dL (ref 0–200)
HDL: 61.1 mg/dL (ref >39.00)
LDL Cholesterol: 103 mg/dL — ABNORMAL HIGH (ref 0–99)
NonHDL: 124.28
Total CHOL/HDL Ratio: 3
Triglycerides: 106 mg/dL (ref 0.0–149.0)
VLDL: 21.2 mg/dL (ref 0.0–40.0)

## 2022-09-10 LAB — TSH: TSH: 0.19 u[IU]/mL — ABNORMAL LOW (ref 0.35–5.50)

## 2022-09-10 MED ORDER — LEVOTHYROXINE SODIUM 50 MCG PO TABS: 50.0000 ug | ORAL_TABLET | Freq: Every day | ORAL | 3 refills | Status: DC

## 2022-09-10 NOTE — Assessment & Plan Note (Signed)
Stable.  Workup completed.

## 2022-09-10 NOTE — Assessment & Plan Note (Signed)
Well-controlled hypertension off medications

## 2022-09-10 NOTE — Assessment & Plan Note (Signed)
Vitamin D level done today. 

## 2022-09-10 NOTE — Assessment & Plan Note (Signed)
Clinically euthyroid.  TSH done today. Continue Synthroid 75 mcg daily. 

## 2022-09-10 NOTE — Patient Instructions (Signed)
Health Maintenance After Age 79 After age 79, you are at a higher risk for certain long-term diseases and infections as well as injuries from falls. Falls are a major cause of broken bones and head injuries in people who are older than age 79. Getting regular preventive care can help to keep you healthy and well. Preventive care includes getting regular testing and making lifestyle changes as recommended by your health care provider. Talk with your health care provider about: Which screenings and tests you should have. A screening is a test that checks for a disease when you have no symptoms. A diet and exercise plan that is right for you. What should I know about screenings and tests to prevent falls? Screening and testing are the best ways to find a health problem early. Early diagnosis and treatment give you the best chance of managing medical conditions that are common after age 79. Certain conditions and lifestyle choices may make you more likely to have a fall. Your health care provider may recommend: Regular vision checks. Poor vision and conditions such as cataracts can make you more likely to have a fall. If you wear glasses, make sure to get your prescription updated if your vision changes. Medicine review. Work with your health care provider to regularly review all of the medicines you are taking, including over-the-counter medicines. Ask your health care provider about any side effects that may make you more likely to have a fall. Tell your health care provider if any medicines that you take make you feel dizzy or sleepy. Strength and balance checks. Your health care provider may recommend certain tests to check your strength and balance while standing, walking, or changing positions. Foot health exam. Foot pain and numbness, as well as not wearing proper footwear, can make you more likely to have a fall. Screenings, including: Osteoporosis screening. Osteoporosis is a condition that causes  the bones to get weaker and break more easily. Blood pressure screening. Blood pressure changes and medicines to control blood pressure can make you feel dizzy. Depression screening. You may be more likely to have a fall if you have a fear of falling, feel depressed, or feel unable to do activities that you used to do. Alcohol use screening. Using too much alcohol can affect your balance and may make you more likely to have a fall. Follow these instructions at home: Lifestyle Do not drink alcohol if: Your health care provider tells you not to drink. If you drink alcohol: Limit how much you have to: 0-1 drink a day for women. 0-2 drinks a day for men. Know how much alcohol is in your drink. In the [place] one drink equals one 12 oz bottle of beer (355 mL), one 5 oz glass of wine (148 mL), or one 1 oz glass of hard liquor (44 mL). Do not use any products that contain nicotine or tobacco. These products include cigarettes, chewing tobacco, and vaping devices, such as e-cigarettes. If you need help quitting, ask your health care provider. Activity  Follow a regular exercise program to stay fit. This will help you maintain your balance. Ask your health care provider what types of exercise are appropriate for you. If you need a cane or walker, use it as recommended by your health care provider. Wear supportive shoes that have nonskid soles. Safety  Remove any tripping hazards, such as rugs, cords, and clutter. Install safety equipment such as grab bars in bathrooms and safety rails on stairs. Keep rooms and walkways   well-lit. General instructions Talk with your health care provider about your risks for falling. Tell your health care provider if: You fall. Be sure to tell your health care provider about all falls, even ones that seem minor. You feel dizzy, tiredness (fatigue), or off-balance. Take over-the-counter and prescription medicines only as told by your health care provider. These include  supplements. Eat a healthy diet and maintain a healthy weight. A healthy diet includes low-fat dairy products, low-fat (lean) meats, and fiber from whole grains, beans, and lots of fruits and vegetables. Stay current with your vaccines. Schedule regular health, dental, and eye exams. Summary Having a healthy lifestyle and getting preventive care can help to protect your health and wellness after age 79. Screening and testing are the best way to find a health problem early and help you avoid having a fall. Early diagnosis and treatment give you the best chance for managing medical conditions that are more common for people who are older than age 79. Falls are a major cause of broken bones and head injuries in people who are older than age 79. Take precautions to prevent a fall at home. Work with your health care provider to learn what changes you can make to improve your health and wellness and to prevent falls. This information is not intended to replace advice given to you by your health care provider. Make sure you discuss any questions you have with your health care provider. Document Revised: 10/03/2020 Document Reviewed: 10/03/2020 Elsevier Patient Education  2023 [employer]  

## 2022-09-10 NOTE — Assessment & Plan Note (Signed)
Chronic stable condition Lipid profile done today Diet and nutrition discussed.

## 2022-09-10 NOTE — Assessment & Plan Note (Signed)
Stable.  Workup completed. No longer needing treatment.

## 2022-09-10 NOTE — Progress Notes (Signed)
Julie Pacheco 77 y.o.   Chief Complaint  Patient presents with   Medical Management of Chronic Issues    f/u appt,     HISTORY OF PRESENT ILLNESS: This is a 77 y.o. female A1A here for 26-month follow up of chronic medical problems Overall doing well.  Has no complaints or medical concerns today. BP Readings from Last 3 Encounters:  09/10/22 136/82  08/30/22 (!) 134/58  05/14/22 (!) 156/73    Wt Readings from Last 3 Encounters:  09/10/22 155 lb (70.3 kg)  08/30/22 155 lb 8 oz (70.5 kg)  08/27/22 153 lb (69.4 kg)     HPI   Prior to Admission medications   Medication Sig Start Date End Date Taking? Authorizing Provider  levothyroxine (SYNTHROID) 75 MCG tablet TAKE 1 TABLET BY MOUTH ONCE DAILY BEFORE BREAKFAST 05/02/22  Yes Veronda Gabor, Eilleen Kempf, MD  diazepam (VALIUM) 5 MG tablet Take 1 tablet (5 mg total) by mouth every 12 (twelve) hours as needed for anxiety. 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/2022   Mass of right breast 02/27/2022   Situational anxiety 02/27/2022   Dyslipidemia 10/05/2019   Other microscopic hematuria 09/11/2018   Transient hypertension 09/12/2016   Hypothyroidism 10/31/2015   Vitamin D deficiency 10/31/2015   BMI 28.0-28.9,adult 10/31/2015   Osteopenia 12/03/2014   H/O pheochromocytoma 09/07/2011   Insomnia 09/07/2011    Past Medical History:  Diagnosis Date   Atypical mole 02/05/2011   mild-Right post knee   Breast cancer    Family history of breast cancer 03/22/2022   Family history of pancreatic cancer 03/22/2022   Migraines    Thyroid disease     Past Surgical History:  Procedure Laterality Date   ABDOMINAL HYSTERECTOMY     BREAST BIOPSY  04/26/2022   MM RT  RADIOACTIVE SEED LOC MAMMO GUIDE 04/26/2022 [facility]   BREAST BIOPSY  04/26/2022   MM RT RADIOACTIVE SEED EA ADD LESION LOC MAMMO GUIDE 04/26/2022 [facility]   BREAST LUMPECTOMY WITH RADIOACTIVE SEED LOCALIZATION Right 04/30/2022   Procedure: RIGHT BREAST BRACKETED LUMPECTOMY WITH RADIOACTIVE SEED LOCALIZATION;  Surgeon: Emelia Loron, MD;  Location: [facility];  Service: General;  Laterality: Right;    Social History   Socioeconomic History   Marital status: Married    Spouse name: Not on file   Number of children: 2   Years of education: Not on file   Highest education level: Not on file  Occupational History   Not on file  Tobacco Use   Smoking status: Former    Packs/day: 1.00    Years: 30.00    Additional pack years: 0.00    Total pack years: 30.00    Types: Cigarettes    Quit date: 12/15/1981    Years since quitting: 40.7   Smokeless tobacco: Never  Substance and Sexual Activity   Alcohol use: Yes    Alcohol/week: 8.0 standard drinks of alcohol    Types: 8 Glasses of wine per week   Drug use: Never   Sexual activity: Not on file  Other Topics Concern   Not on file  Social History Narrative   Not on file   Social Determinants of Health  Financial Resource Strain: Low Risk  (08/27/2022)   Overall Financial Resource Strain (CARDIA)    Difficulty of Paying Living Expenses: Not hard at all  Food Insecurity: No Food Insecurity (08/27/2022)   Hunger Vital Sign    Worried About Running Out of Food in the Last Year: Never true    Ran Out of Food in the Last Year: Never true  Transportation Needs: No Transportation Needs (08/27/2022)   PRAPARE - Administrator, Civil Service (Medical): No    Lack of Transportation (Non-Medical): No  Physical Activity: Sufficiently Active (08/27/2022)   Exercise Vital Sign    Days of Exercise per Week: 5 days    Minutes of Exercise per Session: 30 min  Stress: No Stress Concern Present  (08/27/2022)   [employer] of Occupational Health - Occupational Stress Questionnaire    Feeling of Stress : Not at all  Social Connections: Moderately Integrated (08/27/2022)   Social Connection and Isolation Panel [NHANES]    Frequency of Communication with Friends and Family: Twice a week    Frequency of Social Gatherings with Friends and Family: Twice a week    Attends Religious Services: Never    Database administrator or Organizations: Yes    Attends Engineer, structural: More than 4 times per year    Marital Status: Married  Catering manager Violence: Not At Risk (08/27/2022)   Humiliation, Afraid, Rape, and Kick questionnaire    Fear of Current or Ex-Partner: No    Emotionally Abused: No    Physically Abused: No    Sexually Abused: No    Family History  Problem Relation Age of Onset   Lung cancer Mother        dx 93s   Breast cancer Maternal Aunt 55   Pancreatic cancer Maternal Aunt 25   Cancer Cousin        colon cancer   Other Other        great granddaughter; brain tumor @ birth; pituitary tumor @ age 3   Melanoma Son 47       shoulder     Review of Systems  Constitutional: Negative.  Negative for chills and fever.  HENT: Negative.  Negative for congestion and sore throat.   Respiratory: Negative.  Negative for cough and shortness of breath.   Cardiovascular: Negative.  Negative for chest pain and palpitations.  Gastrointestinal:  Negative for abdominal pain, nausea and vomiting.  Genitourinary: Negative.   Skin: Negative.  Negative for rash.  Neurological:  Negative for dizziness and headaches.  All other systems reviewed and are negative.   Vitals:   09/10/22 1355  BP: 136/82  Pulse: 60  Temp: 97.8 F (36.6 C)  SpO2: 93%    Physical Exam Vitals reviewed.  Constitutional:      Appearance: Normal appearance.  HENT:     Head: Normocephalic.     Mouth/Throat:     Mouth: Mucous membranes are moist.     Pharynx: Oropharynx is clear.   Eyes:     Extraocular Movements: Extraocular movements intact.     Conjunctiva/sclera: Conjunctivae normal.     Pupils: Pupils are equal, round, and reactive to light.  Cardiovascular:     Rate and Rhythm: Normal rate and regular rhythm.     Pulses: Normal pulses.     Heart sounds: Normal heart sounds.  Pulmonary:     Effort: Pulmonary effort is normal.     Breath sounds: Normal breath sounds.  Musculoskeletal:  Cervical back: No tenderness.  Lymphadenopathy:     Cervical: No cervical adenopathy.  Skin:    General: Skin is warm and dry.  Neurological:     General: No focal deficit present.     Mental Status: She is alert and oriented to person, place, and time.  Psychiatric:        Mood and Affect: Mood normal.        Behavior: Behavior normal.      ASSESSMENT & PLAN: A total of 44 minutes was spent with the patient and counseling/coordination of care regarding preparing for this visit, review of most recent office visit notes, review of multiple chronic medical conditions under management, review of all medications, review of most recent blood work results, cardiovascular risks associated with dyslipidemia, education on nutrition, prognosis, documentation, need for follow-up  Problem List Items Addressed This Visit       Cardiovascular and Mediastinum   Transient hypertension    Well-controlled hypertension off medications        Endocrine   Hypothyroidism - Primary    Clinically euthyroid TSH done today Continue Synthroid 75 mcg daily      Relevant Orders   TSH   Comprehensive metabolic panel   CBC with Differential/Platelet     Other   Vitamin D deficiency    Vitamin D level done today.      Relevant Orders   VITAMIN D 25 Hydroxy (Vit-D Deficiency, Fractures)   Dyslipidemia    Chronic stable condition Lipid profile done today Diet and nutrition discussed.      Relevant Orders   Comprehensive metabolic panel   CBC with Differential/Platelet    Lipid panel   Mass of right breast    Stable.  Workup completed.      Ductal carcinoma in situ (DCIS) of right breast    Stable.  Workup completed. No longer needing treatment.      Patient Instructions  Health Maintenance After Age 57 After age 35, you are at a higher risk for certain long-term diseases and infections as well as injuries from falls. Falls are a major cause of broken bones and head injuries in people who are older than age 72. Getting regular preventive care can help to keep you healthy and well. Preventive care includes getting regular testing and making lifestyle changes as recommended by your health care provider. Talk with your health care provider about: Which screenings and tests you should have. A screening is a test that checks for a disease when you have no symptoms. A diet and exercise plan that is right for you. What should I know about screenings and tests to prevent falls? Screening and testing are the best ways to find a health problem early. Early diagnosis and treatment give you the best chance of managing medical conditions that are common after age 42. Certain conditions and lifestyle choices may make you more likely to have a fall. Your health care provider may recommend: Regular vision checks. Poor vision and conditions such as cataracts can make you more likely to have a fall. If you wear glasses, make sure to get your prescription updated if your vision changes. Medicine review. Work with your health care provider to regularly review all of the medicines you are taking, including over-the-counter medicines. Ask your health care provider about any side effects that may make you more likely to have a fall. Tell your health care provider if any medicines that you take make you feel dizzy or sleepy.  Strength and balance checks. Your health care provider may recommend certain tests to check your strength and balance while standing, walking, or changing  positions. Foot health exam. Foot pain and numbness, as well as not wearing proper footwear, can make you more likely to have a fall. Screenings, including: Osteoporosis screening. Osteoporosis is a condition that causes the bones to get weaker and break more easily. Blood pressure screening. Blood pressure changes and medicines to control blood pressure can make you feel dizzy. Depression screening. You may be more likely to have a fall if you have a fear of falling, feel depressed, or feel unable to do activities that you used to do. Alcohol use screening. Using too much alcohol can affect your balance and may make you more likely to have a fall. Follow these instructions at home: Lifestyle Do not drink alcohol if: Your health care provider tells you not to drink. If you drink alcohol: Limit how much you have to: 0-1 drink a day for women. 0-2 drinks a day for men. Know how much alcohol is in your drink. In the [place] one drink equals one 12 oz bottle of beer (355 mL), one 5 oz glass of wine (148 mL), or one 1 oz glass of hard liquor (44 mL). Do not use any products that contain nicotine or tobacco. These products include cigarettes, chewing tobacco, and vaping devices, such as e-cigarettes. If you need help quitting, ask your health care provider. Activity  Follow a regular exercise program to stay fit. This will help you maintain your balance. Ask your health care provider what types of exercise are appropriate for you. If you need a cane or walker, use it as recommended by your health care provider. Wear supportive shoes that have nonskid soles. Safety  Remove any tripping hazards, such as rugs, cords, and clutter. Install safety equipment such as grab bars in bathrooms and safety rails on stairs. Keep rooms and walkways well-lit. General instructions Talk with your health care provider about your risks for falling. Tell your health care provider if: You fall. Be sure to tell your  health care provider about all falls, even ones that seem minor. You feel dizzy, tiredness (fatigue), or off-balance. Take over-the-counter and prescription medicines only as told by your health care provider. These include supplements. Eat a healthy diet and maintain a healthy weight. A healthy diet includes low-fat dairy products, low-fat (lean) meats, and fiber from whole grains, beans, and lots of fruits and vegetables. Stay current with your vaccines. Schedule regular health, dental, and eye exams. Summary Having a healthy lifestyle and getting preventive care can help to protect your health and wellness after age 85. Screening and testing are the best way to find a health problem early and help you avoid having a fall. Early diagnosis and treatment give you the best chance for managing medical conditions that are more common for people who are older than age 73. Falls are a major cause of broken bones and head injuries in people who are older than age 51. Take precautions to prevent a fall at home. Work with your health care provider to learn what changes you can make to improve your health and wellness and to prevent falls. This information is not intended to replace advice given to you by your health care provider. Make sure you discuss any questions you have with your health care provider. Document Revised: 10/03/2020 Document Reviewed: 10/03/2020 Elsevier Patient Education  2023 [employer].     [place]  Mitchel Honour, MD [facility]

## 2022-10-01 ENCOUNTER — Ambulatory Visit: Payer: Medicare Other | Admitting: Dermatology

## 2022-10-25 ENCOUNTER — Other Ambulatory Visit: Payer: Self-pay | Admitting: General Surgery

## 2022-10-25 DIAGNOSIS — Z9889 Other specified postprocedural states: Secondary | ICD-10-CM

## 2023-02-21 ENCOUNTER — Ambulatory Visit
Admission: RE | Admit: 2023-02-21 | Discharge: 2023-02-21 | Disposition: A | Discharge status: Home / Self Care | Payer: Medicare Other | Source: Ambulatory Visit | Attending: General Surgery | Admitting: General Surgery

## 2023-02-21 DIAGNOSIS — R92333 Mammographic heterogeneous density, bilateral breasts: Secondary | ICD-10-CM | POA: Diagnosis not present

## 2023-02-21 DIAGNOSIS — Z853 Personal history of malignant neoplasm of breast: Secondary | ICD-10-CM | POA: Diagnosis not present

## 2023-02-21 DIAGNOSIS — Z9889 Other specified postprocedural states: Secondary | ICD-10-CM

## 2023-02-21 DIAGNOSIS — Z803 Family history of malignant neoplasm of breast: Secondary | ICD-10-CM | POA: Diagnosis not present

## 2023-02-22 ENCOUNTER — Other Ambulatory Visit: Payer: Self-pay | Admitting: General Surgery

## 2023-02-22 DIAGNOSIS — R921 Mammographic calcification found on diagnostic imaging of breast: Secondary | ICD-10-CM

## 2023-04-10 DIAGNOSIS — D0511 Intraductal carcinoma in situ of right breast: Secondary | ICD-10-CM | POA: Diagnosis not present

## 2023-08-23 ENCOUNTER — Ambulatory Visit
Admission: RE | Admit: 2023-08-23 | Discharge: 2023-08-23 | Disposition: A | Discharge status: Home / Self Care | Payer: Medicare Other | Source: Ambulatory Visit | Attending: General Surgery | Admitting: General Surgery

## 2023-08-23 DIAGNOSIS — R921 Mammographic calcification found on diagnostic imaging of breast: Secondary | ICD-10-CM

## 2023-08-23 DIAGNOSIS — Z853 Personal history of malignant neoplasm of breast: Secondary | ICD-10-CM | POA: Diagnosis not present

## 2023-08-28 ENCOUNTER — Ambulatory Visit (INDEPENDENT_AMBULATORY_CARE_PROVIDER_SITE_OTHER)

## 2023-08-28 VITALS — Ht 62.0 in | Wt 155.0 lb

## 2023-08-28 DIAGNOSIS — Z0000 Encounter for general adult medical examination without abnormal findings: Secondary | ICD-10-CM

## 2023-08-28 NOTE — Patient Instructions (Addendum)
 [name] , Thank you for taking time to come for your Medicare Wellness Visit. I appreciate your ongoing commitment to your health goals. Please review the following plan we discussed and let me know if I can assist you in the future.   Referrals/Orders/Follow-Ups/Clinician Recommendations: Aim for 30 minutes of exercise or brisk walking, 6-8 glasses of water, and 5 servings of fruits and vegetables each day.   This is a list of the screening recommended for you and due dates:  Health Maintenance  Topic Date Due   DTaP/Tdap/Td vaccine (1 - Tdap) Never done   Medicare Annual Wellness Visit  08/27/2024   DEXA scan (bone density measurement)  Completed   Hepatitis C Screening  Completed   HPV Vaccine  Aged Out   Pneumonia Vaccine  Discontinued   Flu Shot  Discontinued   Colon Cancer Screening  Discontinued   COVID-19 Vaccine  Discontinued   Zoster (Shingles) Vaccine  Discontinued    Advanced directives: (Copy Requested) Please bring a copy of your health care power of attorney and living will to the office to be added to your chart at your convenience. You can mail to [facility] [address] [place], [place] [address] or email to [email or web address]  Next Medicare Annual Wellness Visit scheduled for next year: Yes

## 2023-08-28 NOTE — Progress Notes (Signed)
 Subjective:   Julie Pacheco is a 78 y.o. who presents for a Medicare Wellness preventive visit.  Visit Complete: Virtual I connected with  Julie Pacheco on 08/28/23 by a audio enabled telemedicine application and verified that I am speaking with the correct person using two identifiers.  Patient Location: Home  Provider Location: Office/Clinic  I discussed the limitations of evaluation and management by telemedicine. The patient expressed understanding and agreed to proceed.  Vital Signs: Because this visit was a virtual/telehealth visit, some criteria may be missing or patient reported. Any vitals not documented were not able to be obtained and vitals that have been documented are patient reported.  VideoDeclined- This patient declined Librarian, academic. Therefore the visit was completed with audio only.  Persons Participating in Visit: Patient.  AWV Questionnaire: No: Patient Medicare AWV questionnaire was not completed prior to this visit.  Cardiac Risk Factors include: advanced age (>53men, >47 women);hypertension     Objective:    Today's Vitals   08/28/23 1017  Weight: 155 lb (70.3 kg)  Height: 5\' 2"  (1.575 m)   Body mass index is 28.35 kg/m.     08/28/2023   10:16 AM 08/27/2022   10:21 AM 04/30/2022    7:25 AM 04/18/2022    2:42 PM 08/25/2021    3:08 PM 09/25/2019    9:45 AM 10/29/2018   10:08 PM  Advanced Directives  Does Patient Have a Medical Advance Directive? Yes Yes Yes Yes Yes No No  Type of Estate agent of [place];Living will Healthcare Power of [place];Living will  Healthcare Power of [place];Living will Healthcare Power of [place];Living will    Does patient want to make changes to medical advance directive?   No - Patient declined No - Patient declined     Copy of Healthcare Power of Attorney in Chart? No - copy requested No - copy requested  No - copy requested No - copy requested    Would patient  like information on creating a medical advance directive?       No - Guardian declined    Current Medications (verified) Outpatient Encounter Medications as of 08/28/2023  Medication Sig   levothyroxine (SYNTHROID) 50 MCG tablet Take 1 tablet (50 mcg total) by mouth daily.   [DISCONTINUED] diazepam (VALIUM) 5 MG tablet Take 1 tablet (5 mg total) by mouth every 12 (twelve) hours as needed for anxiety. (Patient not taking: Reported on 09/10/2022)   No facility-administered encounter medications on file as of 08/28/2023.    Allergies (verified) Patient has no known allergies.   History: Past Medical History:  Diagnosis Date   Atypical mole 02/05/2011   mild-Right post knee   Breast cancer [facility])    Family history of breast cancer 03/22/2022   Family history of pancreatic cancer 03/22/2022   Migraines    Thyroid disease    Past Surgical History:  Procedure Laterality Date   ABDOMINAL HYSTERECTOMY     BREAST BIOPSY  04/26/2022   MM RT RADIOACTIVE SEED LOC MAMMO GUIDE 04/26/2022 [facility]   BREAST BIOPSY  04/26/2022   MM RT RADIOACTIVE SEED EA ADD LESION LOC MAMMO GUIDE 04/26/2022 [facility]   BREAST LUMPECTOMY WITH RADIOACTIVE SEED LOCALIZATION Right 04/30/2022   Procedure: RIGHT BREAST BRACKETED LUMPECTOMY WITH RADIOACTIVE SEED LOCALIZATION;  Surgeon: Emelia Loron, MD;  Location: [facility];  Service: General;  Laterality: Right;   Family History  Problem Relation Age of Onset   Lung cancer Mother  dx 65s   Breast cancer Maternal Aunt 55   Pancreatic cancer Maternal Aunt 86   Cancer Cousin        colon cancer   Other Other        great granddaughter; brain tumor @ birth; pituitary tumor @ age 57   Melanoma Son 20       shoulder   Social History   Socioeconomic History   Marital status: Married    Spouse name: Not on file   Number of children: 2   Years of education: Not on file   Highest education level: Not on file   Occupational History   Not on file  Tobacco Use   Smoking status: Former    Current packs/day: 0.00    Average packs/day: 1 pack/day for 30.0 years (30.0 ttl pk-yrs)    Types: Cigarettes    Start date: 12/16/1951    Quit date: 12/15/1981    Years since quitting: 41.7    Passive exposure: Past   Smokeless tobacco: Never  Vaping Use   Vaping status: Not on file  Substance and Sexual Activity   Alcohol use: Yes    Alcohol/week: 2.0 standard drinks of alcohol    Types: 1 Glasses of wine, 1 Shots of liquor per week    Comment: occ   Drug use: Never   Sexual activity: Not on file  Other Topics Concern   Not on file  Social History Narrative   Not on file   Social Drivers of Health   Financial Resource Strain: Low Risk  (08/28/2023)   Overall Financial Resource Strain (CARDIA)    Difficulty of Paying Living Expenses: Not hard at all  Food Insecurity: No Food Insecurity (08/28/2023)   Hunger Vital Sign    Worried About Running Out of Food in the Last Year: Never true    Ran Out of Food in the Last Year: Never true  Transportation Needs: No Transportation Needs (08/28/2023)   PRAPARE - Administrator, Civil Service (Medical): No    Lack of Transportation (Non-Medical): No  Physical Activity: Sufficiently Active (08/28/2023)   Exercise Vital Sign    Days of Exercise per Week: 7 days    Minutes of Exercise per Session: 40 min  Stress: No Stress Concern Present (08/28/2023)   [employer] of Occupational Health - Occupational Stress Questionnaire    Feeling of Stress : Not at all  Social Connections: Moderately Integrated (08/28/2023)   Social Connection and Isolation Panel [NHANES]    Frequency of Communication with Friends and Family: More than three times a week    Frequency of Social Gatherings with Friends and Family: More than three times a week    Attends Religious Services: Never    Database administrator or Organizations: Yes    Attends Hospital doctor: More than 4 times per year    Marital Status: Married    Tobacco Counseling Counseling given: No    Clinical Intake:  Pre-visit preparation completed: Yes  Pain : No/denies pain     BMI - recorded: 28.35 Nutritional Status: BMI 25 -29 Overweight Nutritional Risks: None Diabetes: No  No results found for: "HGBA1C"   How often do you need to have someone help you when you read instructions, pamphlets, or other written materials from your doctor or pharmacy?: 1 - Never  Interpreter Needed?: No  Information entered by :: Hassell Halim, CMA   Activities of Daily Living  08/28/2023   10:19 AM  In your present state of health, do you have any difficulty performing the following activities:  Hearing? 0  Vision? 0  Difficulty concentrating or making decisions? 0  Walking or climbing stairs? 0  Dressing or bathing? 0  Doing errands, shopping? 0  Preparing Food and eating ? N  Using the Toilet? N  In the past six months, have you accidently leaked urine? N  Do you have problems with loss of bowel control? N  Managing your Medications? N  Managing your Finances? N  Housekeeping or managing your Housekeeping? N    Patient Care Team: Georgina Quint, MD as PCP - General (Internal Medicine) Janalyn Harder, MD (Inactive) as Consulting Physician (Dermatology) Smitty Cords, OD as Consulting Physician (Optometry) Pershing Proud, RN as Oncology Nurse Navigator Donnelly Angelica, RN as Oncology Nurse Navigator Emelia Loron, MD as Consulting Physician (General Surgery) Malachy Mood, MD as Consulting Physician (Hematology) Dorothy Puffer, MD as Consulting Physician (Radiation Oncology)  Indicate any recent Medical Services you may have received from other than [facility] providers in the past year (date may be approximate).     Assessment:   This is a routine wellness examination for Julie Pacheco.  Hearing/Vision screen Hearing Screening - Comments:: Denies  hearing difficulties   Vision Screening - Comments:: Wears rx glasses - up to date with routine eye exams with [facility]   Goals Addressed               This Visit's Progress     Patient Stated (pt-stated)        Patient stated she feels good for her age and will stay active.       Depression Screen     08/28/2023   10:22 AM 09/10/2022    1:56 PM 08/27/2022   10:27 AM 02/27/2022   10:04 AM 12/19/2021    3:59 PM 08/28/2021    1:09 PM 08/25/2021    3:09 PM  PHQ 2/9 Scores  PHQ - 2 Score 0 0 0 0 0 0 0  PHQ- 9 Score 0    1      Fall Risk     08/28/2023   10:20 AM 09/10/2022    1:56 PM 08/27/2022   10:23 AM 02/27/2022   10:05 AM 12/19/2021    3:59 PM  Fall Risk   Falls in the past year? 0 0 0 0 0  Number falls in past yr: 0 0 0 0 0  Injury with Fall? 0 0 0 0 0  Risk for fall due to : No Fall Risks No Fall Risks No Fall Risks No Fall Risks   Follow up Falls prevention discussed;Falls evaluation completed Falls evaluation completed Falls prevention discussed Falls evaluation completed     MEDICARE RISK AT HOME:  Medicare Risk at Home Any stairs in or around the home?: Yes If so, are there any without handrails?: No Home free of loose throw rugs in walkways, pet beds, electrical cords, etc?: Yes Adequate lighting in your home to reduce risk of falls?: Yes Life alert?: No Use of a cane, walker or w/c?: No Grab bars in the bathroom?: No Shower chair or bench in shower?: Yes Elevated toilet seat or a handicapped toilet?: No  TIMED UP AND GO:  Was the test performed?  No  Cognitive Function: 6CIT completed        08/28/2023   10:22 AM 08/27/2022   10:25 AM 09/25/2019    9:37  AM 07/23/2018    9:55 AM  6CIT Screen  What Year? 0 points 0 points 0 points 0 points  What month? 0 points 0 points 0 points 0 points  What time? 0 points 0 points 0 points 0 points  Count back from 20 0 points 0 points 0 points 0 points  Months in reverse 0 points 0 points 0 points 0 points   Repeat phrase 0 points 0 points 0 points 0 points  Total Score 0 points 0 points 0 points 0 points    Immunizations Immunization History  Administered Date(s) Administered   PFIZER(Purple Top)SARS-COV-2 Vaccination 07/04/2019, 07/29/2019   Zoster Recombinant(Shingrix) 02/27/2021    Screening Tests Health Maintenance  Topic Date Due   DTaP/Tdap/Td (1 - Tdap) Never done   Medicare Annual Wellness (AWV)  08/27/2024   DEXA SCAN  Completed   Hepatitis C Screening  Completed   HPV VACCINES  Aged Out   Pneumonia Vaccine 42+ Years old  Discontinued   INFLUENZA VACCINE  Discontinued   Colonoscopy  Discontinued   COVID-19 Vaccine  Discontinued   Zoster Vaccines- Shingrix  Discontinued    Health Maintenance  Health Maintenance Due  Topic Date Due   DTaP/Tdap/Td (1 - Tdap) Never done   Health Maintenance Items Addressed: 08/28/2023  Tdap vaccine - Pt declines  Additional Screening:  Vision Screening: Recommended annual ophthalmology exams for early detection of glaucoma and other disorders of the eye.  Dental Screening: Recommended annual dental exams for proper oral hygiene  Community Resource Referral / Chronic Care Management: CRR required this visit?  No   CCM required this visit?  No     Plan:     I have personally reviewed and noted the following in the patient's chart:   Medical and social history Use of alcohol, tobacco or illicit drugs  Current medications and supplements including opioid prescriptions. Patient is not currently taking opioid prescriptions. Functional ability and status Nutritional status Physical activity Advanced directives List of other physicians Hospitalizations, surgeries, and ER visits in previous 12 months Vitals Screenings to include cognitive, depression, and falls Referrals and appointments  In addition, I have reviewed and discussed with patient certain preventive protocols, quality metrics, and best practice recommendations.  A written personalized care plan for preventive services as well as general preventive health recommendations were provided to patient.     Darreld Mclean, CMA   08/28/2023   After Visit Summary: (MyChart) Due to this being a telephonic visit, the after visit summary with patients personalized plan was offered to patient via MyChart   Notes: Nothing significant to report at this time.

## 2023-08-29 ENCOUNTER — Ambulatory Visit (INDEPENDENT_AMBULATORY_CARE_PROVIDER_SITE_OTHER): Admitting: Emergency Medicine

## 2023-08-29 ENCOUNTER — Encounter: Payer: Self-pay | Admitting: Emergency Medicine

## 2023-08-29 VITALS — BP 118/64 | HR 50 | Temp 97.8°F | Ht 62.0 in | Wt 155.0 lb

## 2023-08-29 DIAGNOSIS — E039 Hypothyroidism, unspecified: Secondary | ICD-10-CM

## 2023-08-29 DIAGNOSIS — M8580 Other specified disorders of bone density and structure, unspecified site: Secondary | ICD-10-CM | POA: Diagnosis not present

## 2023-08-29 DIAGNOSIS — E785 Hyperlipidemia, unspecified: Secondary | ICD-10-CM

## 2023-08-29 DIAGNOSIS — D0511 Intraductal carcinoma in situ of right breast: Secondary | ICD-10-CM

## 2023-08-29 LAB — LIPID PANEL
Cholesterol: 202 mg/dL — ABNORMAL HIGH (ref 0–200)
HDL: 61.9 mg/dL (ref >39.00)
LDL Cholesterol: 124 mg/dL — ABNORMAL HIGH (ref 0–99)
NonHDL: 139.96
Total CHOL/HDL Ratio: 3
Triglycerides: 81 mg/dL (ref 0.0–149.0)
VLDL: 16.2 mg/dL (ref 0.0–40.0)

## 2023-08-29 LAB — COMPREHENSIVE METABOLIC PANEL WITH GFR
ALT: 15 U/L (ref 0–35)
AST: 18 U/L (ref 0–37)
Albumin: 4.1 g/dL (ref 3.5–5.2)
Alkaline Phosphatase: 55 U/L (ref 39–117)
BUN: 11 mg/dL (ref 6–23)
CO2: 30 meq/L (ref 19–32)
Calcium: 9.2 mg/dL (ref 8.4–10.5)
Chloride: 105 meq/L (ref 96–112)
Creatinine, Ser: 0.82 mg/dL (ref 0.40–1.20)
GFR: 67.95 mL/min (ref >60.00)
Glucose, Bld: 97 mg/dL (ref 70–99)
Potassium: 4.3 meq/L (ref 3.5–5.1)
Sodium: 140 meq/L (ref 135–145)
Total Bilirubin: 0.5 mg/dL (ref 0.2–1.2)
Total Protein: 7.2 g/dL (ref 6.0–8.3)

## 2023-08-29 LAB — CBC WITH DIFFERENTIAL/PLATELET
Basophils Absolute: 0 10*3/uL (ref 0.0–0.1)
Basophils Relative: 0.9 % (ref 0.0–3.0)
Eosinophils Absolute: 0.1 10*3/uL (ref 0.0–0.7)
Eosinophils Relative: 1.1 % (ref 0.0–5.0)
HCT: 40.4 % (ref 36.0–46.0)
Hemoglobin: 13.6 g/dL (ref 12.0–15.0)
Lymphocytes Relative: 30 % (ref 12.0–46.0)
Lymphs Abs: 1.6 10*3/uL (ref 0.7–4.0)
MCHC: 33.5 g/dL (ref 30.0–36.0)
MCV: 93.4 fl (ref 78.0–100.0)
Monocytes Absolute: 0.4 10*3/uL (ref 0.1–1.0)
Monocytes Relative: 7.3 % (ref 3.0–12.0)
Neutro Abs: 3.3 10*3/uL (ref 1.4–7.7)
Neutrophils Relative %: 60.7 % (ref 43.0–77.0)
Platelets: 200 10*3/uL (ref 150.0–400.0)
RBC: 4.33 Mil/uL (ref 3.87–5.11)
RDW: 13.2 % (ref 11.5–15.5)
WBC: 5.4 10*3/uL (ref 4.0–10.5)

## 2023-08-29 LAB — URINALYSIS, ROUTINE W REFLEX MICROSCOPIC
Bilirubin Urine: NEGATIVE
Ketones, ur: NEGATIVE
Leukocytes,Ua: NEGATIVE
Nitrite: NEGATIVE
Specific Gravity, Urine: 1.025 (ref 1.000–1.030)
Urine Glucose: NEGATIVE
Urobilinogen, UA: 0.2 (ref 0.0–1.0)
pH: 6 (ref 5.0–8.0)

## 2023-08-29 LAB — TSH: TSH: 4.34 u[IU]/mL (ref 0.35–5.50)

## 2023-08-29 MED ORDER — LEVOTHYROXINE SODIUM 50 MCG PO TABS: 50.0000 ug | ORAL_TABLET | Freq: Every day | ORAL | 3 refills | Status: DC

## 2023-08-29 MED ORDER — LEVOTHYROXINE SODIUM 50 MCG PO TABS: 50.0000 ug | ORAL_TABLET | Freq: Every day | ORAL | 3 refills | Status: AC

## 2023-08-29 NOTE — Patient Instructions (Signed)
 Health Maintenance After Age 79 After age 4, you are at a higher risk for certain long-term diseases and infections as well as injuries from falls. Falls are a major cause of broken bones and head injuries in people who are older than age 47. Getting regular preventive care can help to keep you healthy and well. Preventive care includes getting regular testing and making lifestyle changes as recommended by your health care provider. Talk with your health care provider about: Which screenings and tests you should have. A screening is a test that checks for a disease when you have no symptoms. A diet and exercise plan that is right for you. What should I know about screenings and tests to prevent falls? Screening and testing are the best ways to find a health problem early. Early diagnosis and treatment give you the best chance of managing medical conditions that are common after age 37. Certain conditions and lifestyle choices may make you more likely to have a fall. Your health care provider may recommend: Regular vision checks. Poor vision and conditions such as cataracts can make you more likely to have a fall. If you wear glasses, make sure to get your prescription updated if your vision changes. Medicine review. Work with your health care provider to regularly review all of the medicines you are taking, including over-the-counter medicines. Ask your health care provider about any side effects that may make you more likely to have a fall. Tell your health care provider if any medicines that you take make you feel dizzy or sleepy. Strength and balance checks. Your health care provider may recommend certain tests to check your strength and balance while standing, walking, or changing positions. Foot health exam. Foot pain and numbness, as well as not wearing proper footwear, can make you more likely to have a fall. Screenings, including: Osteoporosis screening. Osteoporosis is a condition that causes  the bones to get weaker and break more easily. Blood pressure screening. Blood pressure changes and medicines to control blood pressure can make you feel dizzy. Depression screening. You may be more likely to have a fall if you have a fear of falling, feel depressed, or feel unable to do activities that you used to do. Alcohol use screening. Using too much alcohol can affect your balance and may make you more likely to have a fall. Follow these instructions at home: Lifestyle Do not drink alcohol if: Your health care provider tells you not to drink. If you drink alcohol: Limit how much you have to: 0-1 drink a day for women. 0-2 drinks a day for men. Know how much alcohol is in your drink. In the [place] one drink equals one 12 oz bottle of beer (355 mL), one 5 oz glass of wine (148 mL), or one 1 oz glass of hard liquor (44 mL). Do not use any products that contain nicotine or tobacco. These products include cigarettes, chewing tobacco, and vaping devices, such as e-cigarettes. If you need help quitting, ask your health care provider. Activity  Follow a regular exercise program to stay fit. This will help you maintain your balance. Ask your health care provider what types of exercise are appropriate for you. If you need a cane or walker, use it as recommended by your health care provider. Wear supportive shoes that have nonskid soles. Safety  Remove any tripping hazards, such as rugs, cords, and clutter. Install safety equipment such as grab bars in bathrooms and safety rails on stairs. Keep rooms and walkways  well-lit. General instructions Talk with your health care provider about your risks for falling. Tell your health care provider if: You fall. Be sure to tell your health care provider about all falls, even ones that seem minor. You feel dizzy, tiredness (fatigue), or off-balance. Take over-the-counter and prescription medicines only as told by your health care provider. These include  supplements. Eat a healthy diet and maintain a healthy weight. A healthy diet includes low-fat dairy products, low-fat (lean) meats, and fiber from whole grains, beans, and lots of fruits and vegetables. Stay current with your vaccines. Schedule regular health, dental, and eye exams. Summary Having a healthy lifestyle and getting preventive care can help to protect your health and wellness after age 11. Screening and testing are the best way to find a health problem early and help you avoid having a fall. Early diagnosis and treatment give you the best chance for managing medical conditions that are more common for people who are older than age 28. Falls are a major cause of broken bones and head injuries in people who are older than age 48. Take precautions to prevent a fall at home. Work with your health care provider to learn what changes you can make to improve your health and wellness and to prevent falls. This information is not intended to replace advice given to you by your health care provider. Make sure you discuss any questions you have with your health care provider. Document Revised: 10/03/2020 Document Reviewed: 10/03/2020 Elsevier Patient Education  2024 [employer].

## 2023-08-29 NOTE — Assessment & Plan Note (Signed)
 Clinically stable. Advised to supplement with over-the-counter vitamin D and calcium

## 2023-08-29 NOTE — Assessment & Plan Note (Signed)
Chronic stable condition Lipid profile done today Diet and nutrition discussed. 

## 2023-08-29 NOTE — Assessment & Plan Note (Signed)
Stable.  Workup completed. No longer needing treatment.

## 2023-08-29 NOTE — Progress Notes (Signed)
 Julie Pacheco 78 y.o.   Chief Complaint  Patient presents with   Annual Exam    Patient here for physical. She did bring health care power of attorney paper completed. Patient wants to know a good dermatologist     HISTORY OF PRESENT ILLNESS: This is a 78 y.o. female here for follow-up of chronic medical conditions Overall doing well.  Has no complaints or medical concerns today.   HPI   Prior to Admission medications   Medication Sig Start Date End Date Taking? Authorizing Provider  levothyroxine (SYNTHROID) 50 MCG tablet Take 1 tablet (50 mcg total) by mouth daily. 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   Mass of right breast 02/27/2022   Situational anxiety 02/27/2022   Dyslipidemia 10/05/2019   Other microscopic hematuria 09/11/2018   Transient hypertension 09/12/2016   Hypothyroidism 10/31/2015   Vitamin D deficiency 10/31/2015   BMI 28.0-28.9,adult 10/31/2015   Osteopenia 12/03/2014   H/O pheochromocytoma 09/07/2011   Insomnia 09/07/2011    Past Medical History:  Diagnosis Date   Atypical mole 02/05/2011   mild-Right post knee   Breast cancer (HCC)    Family history of breast cancer 03/22/2022   Family history of pancreatic cancer 03/22/2022   Migraines    Thyroid disease     Past Surgical History:  Procedure Laterality Date   ABDOMINAL HYSTERECTOMY     BREAST BIOPSY  04/26/2022   MM RT RADIOACTIVE SEED LOC MAMMO GUIDE 04/26/2022 [facility]   BREAST BIOPSY  04/26/2022   MM RT RADIOACTIVE SEED EA ADD LESION LOC MAMMO GUIDE 04/26/2022 [facility]   BREAST LUMPECTOMY WITH RADIOACTIVE SEED LOCALIZATION Right 04/30/2022   Procedure: RIGHT BREAST BRACKETED LUMPECTOMY WITH RADIOACTIVE SEED LOCALIZATION;  Surgeon:  Emelia Loron, MD;  Location: [facility];  Service: General;  Laterality: Right;    Social History   Socioeconomic History   Marital status: Married    Spouse name: Not on file   Number of children: 2   Years of education: Not on file   Highest education level: Not on file  Occupational History   Not on file  Tobacco Use   Smoking status: Former    Current packs/day: 0.00    Average packs/day: 1 pack/day for 30.0 years (30.0 ttl pk-yrs)    Types: Cigarettes    Start date: 12/16/1951    Quit date: 12/15/1981    Years since quitting: 41.7    Passive exposure: Past   Smokeless tobacco: Never  Vaping Use   Vaping status: Not on file  Substance and Sexual Activity   Alcohol use: Yes    Alcohol/week: 2.0 standard drinks of alcohol    Types: 1 Glasses of wine, 1 Shots of liquor per week    Comment: occ   Drug use: Never   Sexual activity: Not on file  Other Topics Concern   Not on file  Social History Narrative   Not on file   Social Drivers of Health   Financial Resource Strain: Low Risk  (08/28/2023)   Overall Financial Resource Strain (CARDIA)    Difficulty of Paying Living Expenses: Not hard at all  Food Insecurity: No Food Insecurity (08/28/2023)   Hunger Vital Sign    Worried About Running  Out of Food in the Last Year: Never true    Ran Out of Food in the Last Year: Never true  Transportation Needs: No Transportation Needs (08/28/2023)   PRAPARE - Administrator, Civil Service (Medical): No    Lack of Transportation (Non-Medical): No  Physical Activity: Sufficiently Active (08/28/2023)   Exercise Vital Sign    Days of Exercise per Week: 7 days    Minutes of Exercise per Session: 40 min  Stress: No Stress Concern Present (08/28/2023)   [employer] of Occupational Health - Occupational Stress Questionnaire    Feeling of Stress : Not at all  Social Connections: Moderately Integrated (08/28/2023)   Social Connection and Isolation Panel  [NHANES]    Frequency of Communication with Friends and Family: More than three times a week    Frequency of Social Gatherings with Friends and Family: More than three times a week    Attends Religious Services: Never    Database administrator or Organizations: Yes    Attends Engineer, structural: More than 4 times per year    Marital Status: Married  Catering manager Violence: Not At Risk (08/28/2023)   Humiliation, Afraid, Rape, and Kick questionnaire    Fear of Current or Ex-Partner: No    Emotionally Abused: No    Physically Abused: No    Sexually Abused: No    Family History  Problem Relation Age of Onset   Lung cancer Mother        dx 90s   Breast cancer Maternal Aunt 55   Pancreatic cancer Maternal Aunt 49   Cancer Cousin        colon cancer   Other Other        great granddaughter; brain tumor @ birth; pituitary tumor @ age 68   Melanoma Son 71       shoulder     Review of Systems  Constitutional: Negative.  Negative for chills and fever.  HENT: Negative.  Negative for congestion and sore throat.   Respiratory: Negative.  Negative for cough and shortness of breath.   Cardiovascular: Negative.  Negative for chest pain and palpitations.  Gastrointestinal:  Negative for abdominal pain, diarrhea, nausea and vomiting.  Genitourinary: Negative.  Negative for dysuria and hematuria.  Skin: Negative.  Negative for rash.  Neurological: Negative.  Negative for dizziness and headaches.  All other systems reviewed and are negative.   Today's Vitals   08/29/23 1255  BP: 118/64  Pulse: (!) 50  Temp: 97.8 F (36.6 C)  TempSrc: Oral  SpO2: 98%  Weight: 155 lb (70.3 kg)  Height: 5\' 2"  (1.575 m)   Body mass index is 28.35 kg/m.   Physical Exam Vitals reviewed.  Constitutional:      Appearance: Normal appearance.  HENT:     Mouth/Throat:     Mouth: Mucous membranes are moist.     Pharynx: Oropharynx is clear.  Eyes:     Extraocular Movements: Extraocular  movements intact.     Pupils: Pupils are equal, round, and reactive to light.  Neck:     Vascular: No carotid bruit.  Cardiovascular:     Rate and Rhythm: Normal rate.     Pulses: Normal pulses.     Heart sounds: Normal heart sounds.  Pulmonary:     Effort: Pulmonary effort is normal.     Breath sounds: Normal breath sounds.  Abdominal:     Palpations: Abdomen is soft.     Tenderness:  There is no abdominal tenderness.  Musculoskeletal:     Cervical back: No tenderness.     Right lower leg: No edema.     Left lower leg: No edema.  Lymphadenopathy:     Cervical: No cervical adenopathy.  Skin:    General: Skin is warm and dry.     Capillary Refill: Capillary refill takes less than 2 seconds.  Neurological:     Mental Status: She is alert and oriented to person, place, and time.  Psychiatric:        Behavior: Behavior normal.      ASSESSMENT & PLAN: A total of 42 minutes was spent with the patient and counseling/coordination of care regarding preparing for this visit, review of most recent office visit notes, review of multiple chronic medical conditions and their management, review of all medications, review of most recent bloodwork results, review of health maintenance items, education on nutrition, prognosis, documentation, and need for follow up.   Problem List Items Addressed This Visit       Endocrine   Hypothyroidism - Primary   Relevant Medications   levothyroxine (SYNTHROID) 50 MCG tablet   Other Relevant Orders   Urinalysis   CBC with Differential/Platelet   Comprehensive metabolic panel with GFR   TSH     Musculoskeletal and Integument   Osteopenia   Clinically stable. Advised to supplement with over-the-counter vitamin D and calcium      Relevant Orders   Urinalysis   CBC with Differential/Platelet     Other   Dyslipidemia   Chronic stable condition Lipid profile done today Diet and nutrition discussed.      Relevant Orders   Urinalysis   CBC  with Differential/Platelet   Comprehensive metabolic panel with GFR   Lipid panel   Ductal carcinoma in situ (DCIS) of right breast   Stable.  Workup completed. No longer needing treatment.      Relevant Orders   Urinalysis   CBC with Differential/Platelet   Patient Instructions  Health Maintenance After Age 70 After age 73, you are at a higher risk for certain long-term diseases and infections as well as injuries from falls. Falls are a major cause of broken bones and head injuries in people who are older than age 78. Getting regular preventive care can help to keep you healthy and well. Preventive care includes getting regular testing and making lifestyle changes as recommended by your health care provider. Talk with your health care provider about: Which screenings and tests you should have. A screening is a test that checks for a disease when you have no symptoms. A diet and exercise plan that is right for you. What should I know about screenings and tests to prevent falls? Screening and testing are the best ways to find a health problem early. Early diagnosis and treatment give you the best chance of managing medical conditions that are common after age 25. Certain conditions and lifestyle choices may make you more likely to have a fall. Your health care provider may recommend: Regular vision checks. Poor vision and conditions such as cataracts can make you more likely to have a fall. If you wear glasses, make sure to get your prescription updated if your vision changes. Medicine review. Work with your health care provider to regularly review all of the medicines you are taking, including over-the-counter medicines. Ask your health care provider about any side effects that may make you more likely to have a fall. Tell your health care provider  if any medicines that you take make you feel dizzy or sleepy. Strength and balance checks. Your health care provider may recommend certain tests to  check your strength and balance while standing, walking, or changing positions. Foot health exam. Foot pain and numbness, as well as not wearing proper footwear, can make you more likely to have a fall. Screenings, including: Osteoporosis screening. Osteoporosis is a condition that causes the bones to get weaker and break more easily. Blood pressure screening. Blood pressure changes and medicines to control blood pressure can make you feel dizzy. Depression screening. You may be more likely to have a fall if you have a fear of falling, feel depressed, or feel unable to do activities that you used to do. Alcohol use screening. Using too much alcohol can affect your balance and may make you more likely to have a fall. Follow these instructions at home: Lifestyle Do not drink alcohol if: Your health care provider tells you not to drink. If you drink alcohol: Limit how much you have to: 0-1 drink a day for women. 0-2 drinks a day for men. Know how much alcohol is in your drink. In the [place] one drink equals one 12 oz bottle of beer (355 mL), one 5 oz glass of wine (148 mL), or one 1 oz glass of hard liquor (44 mL). Do not use any products that contain nicotine or tobacco. These products include cigarettes, chewing tobacco, and vaping devices, such as e-cigarettes. If you need help quitting, ask your health care provider. Activity  Follow a regular exercise program to stay fit. This will help you maintain your balance. Ask your health care provider what types of exercise are appropriate for you. If you need a cane or walker, use it as recommended by your health care provider. Wear supportive shoes that have nonskid soles. Safety  Remove any tripping hazards, such as rugs, cords, and clutter. Install safety equipment such as grab bars in bathrooms and safety rails on stairs. Keep rooms and walkways well-lit. General instructions Talk with your health care provider about your risks for falling.  Tell your health care provider if: You fall. Be sure to tell your health care provider about all falls, even ones that seem minor. You feel dizzy, tiredness (fatigue), or off-balance. Take over-the-counter and prescription medicines only as told by your health care provider. These include supplements. Eat a healthy diet and maintain a healthy weight. A healthy diet includes low-fat dairy products, low-fat (lean) meats, and fiber from whole grains, beans, and lots of fruits and vegetables. Stay current with your vaccines. Schedule regular health, dental, and eye exams. Summary Having a healthy lifestyle and getting preventive care can help to protect your health and wellness after age 68. Screening and testing are the best way to find a health problem early and help you avoid having a fall. Early diagnosis and treatment give you the best chance for managing medical conditions that are more common for people who are older than age 80. Falls are a major cause of broken bones and head injuries in people who are older than age 47. Take precautions to prevent a fall at home. Work with your health care provider to learn what changes you can make to improve your health and wellness and to prevent falls. This information is not intended to replace advice given to you by your health care provider. Make sure you discuss any questions you have with your health care provider. Document Revised: 10/03/2020 Document Reviewed: 10/03/2020  Elsevier Patient Education  2024 [employer]     Edwina Barth, MD [facility]

## 2023-12-06 ENCOUNTER — Other Ambulatory Visit: Payer: Self-pay | Admitting: Emergency Medicine

## 2023-12-06 DIAGNOSIS — Z853 Personal history of malignant neoplasm of breast: Secondary | ICD-10-CM

## 2023-12-06 DIAGNOSIS — R921 Mammographic calcification found on diagnostic imaging of breast: Secondary | ICD-10-CM

## 2023-12-06 DIAGNOSIS — Z9889 Other specified postprocedural states: Secondary | ICD-10-CM

## 2024-02-17 DIAGNOSIS — Z1283 Encounter for screening for malignant neoplasm of skin: Secondary | ICD-10-CM | POA: Diagnosis not present

## 2024-02-17 DIAGNOSIS — D692 Other nonthrombocytopenic purpura: Secondary | ICD-10-CM | POA: Diagnosis not present

## 2024-02-17 DIAGNOSIS — L814 Other melanin hyperpigmentation: Secondary | ICD-10-CM | POA: Diagnosis not present

## 2024-02-17 DIAGNOSIS — L821 Other seborrheic keratosis: Secondary | ICD-10-CM | POA: Diagnosis not present

## 2024-02-25 ENCOUNTER — Encounter

## 2024-02-25 ENCOUNTER — Ambulatory Visit
Admission: RE | Admit: 2024-02-25 | Discharge: 2024-02-25 | Disposition: A | Discharge status: Home / Self Care | Source: Ambulatory Visit | Attending: Emergency Medicine | Admitting: Emergency Medicine

## 2024-02-25 DIAGNOSIS — Z853 Personal history of malignant neoplasm of breast: Secondary | ICD-10-CM

## 2024-02-25 DIAGNOSIS — R921 Mammographic calcification found on diagnostic imaging of breast: Secondary | ICD-10-CM

## 2024-02-25 DIAGNOSIS — Z9889 Other specified postprocedural states: Secondary | ICD-10-CM

## 2024-08-31 ENCOUNTER — Encounter: Admitting: Emergency Medicine

## 2024-08-31 ENCOUNTER — Ambulatory Visit
